# Patient Record
Sex: Male | Born: 1960 | Race: White | Hispanic: No | Marital: Single | Smoking: Former smoker
Health system: Southern US, Community
[De-identification: ages and names within clinical notes are randomized; demographics above are authoritative.]

## PROBLEM LIST (undated history)

## (undated) DIAGNOSIS — K219 Gastro-esophageal reflux disease without esophagitis: Secondary | ICD-10-CM

## (undated) DIAGNOSIS — E785 Hyperlipidemia, unspecified: Secondary | ICD-10-CM

## (undated) DIAGNOSIS — C801 Malignant (primary) neoplasm, unspecified: Secondary | ICD-10-CM

## (undated) DIAGNOSIS — G4700 Insomnia, unspecified: Secondary | ICD-10-CM

## (undated) DIAGNOSIS — I10 Essential (primary) hypertension: Secondary | ICD-10-CM

## (undated) DIAGNOSIS — G4730 Sleep apnea, unspecified: Secondary | ICD-10-CM

## (undated) DIAGNOSIS — Z7251 High risk heterosexual behavior: Secondary | ICD-10-CM

## (undated) DIAGNOSIS — H409 Unspecified glaucoma: Secondary | ICD-10-CM

## (undated) HISTORY — PX: RETINAL DETACHMENT SURGERY: SHX105

## (undated) HISTORY — PX: COLONOSCOPY: SHX174

## (undated) HISTORY — DX: High risk heterosexual behavior: Z72.51

## (undated) HISTORY — DX: Unspecified glaucoma: H40.9

## (undated) HISTORY — DX: Insomnia, unspecified: G47.00

## (undated) HISTORY — PX: TONSILLECTOMY: SUR1361

---

## 2012-05-25 HISTORY — PX: COLONOSCOPY: SHX174

## 2019-06-28 DIAGNOSIS — Z113 Encounter for screening for infections with a predominantly sexual mode of transmission: Secondary | ICD-10-CM | POA: Diagnosis not present

## 2019-06-28 DIAGNOSIS — Z114 Encounter for screening for human immunodeficiency virus [HIV]: Secondary | ICD-10-CM | POA: Diagnosis not present

## 2019-07-03 DIAGNOSIS — Z6841 Body Mass Index (BMI) 40.0 and over, adult: Secondary | ICD-10-CM | POA: Diagnosis not present

## 2019-07-03 DIAGNOSIS — G4730 Sleep apnea, unspecified: Secondary | ICD-10-CM | POA: Diagnosis not present

## 2019-07-03 DIAGNOSIS — E7800 Pure hypercholesterolemia, unspecified: Secondary | ICD-10-CM | POA: Diagnosis not present

## 2019-07-03 DIAGNOSIS — I10 Essential (primary) hypertension: Secondary | ICD-10-CM | POA: Diagnosis not present

## 2019-07-11 DIAGNOSIS — G4733 Obstructive sleep apnea (adult) (pediatric): Secondary | ICD-10-CM | POA: Diagnosis not present

## 2019-07-11 DIAGNOSIS — R5383 Other fatigue: Secondary | ICD-10-CM | POA: Diagnosis not present

## 2019-07-11 DIAGNOSIS — I10 Essential (primary) hypertension: Secondary | ICD-10-CM | POA: Diagnosis not present

## 2019-07-11 DIAGNOSIS — E669 Obesity, unspecified: Secondary | ICD-10-CM | POA: Diagnosis not present

## 2019-07-12 ENCOUNTER — Telehealth: Payer: Self-pay | Admitting: Pharmacy Technician

## 2019-07-12 ENCOUNTER — Other Ambulatory Visit: Payer: Self-pay

## 2019-07-12 ENCOUNTER — Ambulatory Visit (INDEPENDENT_AMBULATORY_CARE_PROVIDER_SITE_OTHER): Payer: BLUE CROSS/BLUE SHIELD | Admitting: Pharmacist

## 2019-07-12 ENCOUNTER — Other Ambulatory Visit (HOSPITAL_COMMUNITY)
Admission: RE | Admit: 2019-07-12 | Discharge: 2019-07-12 | Disposition: A | Discharge status: Home / Self Care | Payer: BLUE CROSS/BLUE SHIELD | Source: Ambulatory Visit | Attending: Infectious Disease | Admitting: Infectious Disease

## 2019-07-12 DIAGNOSIS — Z79899 Other long term (current) drug therapy: Secondary | ICD-10-CM | POA: Diagnosis not present

## 2019-07-12 DIAGNOSIS — Z113 Encounter for screening for infections with a predominantly sexual mode of transmission: Secondary | ICD-10-CM | POA: Diagnosis not present

## 2019-07-12 DIAGNOSIS — Z114 Encounter for screening for human immunodeficiency virus [HIV]: Secondary | ICD-10-CM | POA: Diagnosis not present

## 2019-07-12 NOTE — Telephone Encounter (Signed)
[facility] Patient Advocate Encounter   I was successful in obtaining a copay card for Descovy through [place].  This copay card will make the patient's copay $0.  I have spoken with the patient and they will have to fill with [facility].    The billing information is:   Member ID: [id number] RxBin: [id number] PCN: [id number] Group: [id number]  Patient knows to call the office with questions or concerns.  Bartholomew Crews, CPhT Specialty Pharmacy Patient [facility] for [facility] Phone: [phone] Fax: [phone] 07/12/2019 10:26 AM

## 2019-07-12 NOTE — Telephone Encounter (Signed)
[facility] Patient Advocate Encounter    Findings of the benefits investigation conducted this morning via test claims for the patient's upcoming appointment on 07/12/2019 are as follows:   Insurance: Advance- active commercial [facility] [phone]   [facility] Patient Advocate will follow up once patient arrives for their appointment to see if they need a copayment card.  Bartholomew Crews, CPhT Specialty Pharmacy Patient [facility] for Infectious Disease Phone: [phone] Fax: [phone] 07/12/2019 8:11 AM

## 2019-07-12 NOTE — Progress Notes (Signed)
Date:  07/12/2019   HPI: Craig Cole is a 59 y.o. male who presents to the [place] clinic to discuss and initiate PrEP.  Insured   [x]    Uninsured  []    There are no problems to display for this patient.   Patient's Medications  New Prescriptions   No medications on file  Previous Medications   ASPIRIN EC 81 MG TABLET    Take 81 mg by mouth daily.   DILTIAZEM (CARDIZEM CD) 240 MG 24 HR CAPSULE    Take 240 mg by mouth daily.   DORZOLAMIDE-TIMOLOL (COSOPT) 22.3-6.8 MG/ML OPHTHALMIC SOLUTION    Place 1 drop into the left eye 2 (two) times daily.   EMTRICITABINE-TENOFOVIR AF (DESCOVY) 200-25 MG TABLET    Take 1 tablet by mouth daily.   ESOMEPRAZOLE (NEXIUM) 40 MG CAPSULE    Take 40 mg by mouth every morning.   LISINOPRIL-HYDROCHLOROTHIAZIDE (ZESTORETIC) 20-25 MG TABLET    Take 1 tablet by mouth daily.   MONTELUKAST (SINGULAIR) 10 MG TABLET    Take 10 mg by mouth at bedtime.   SIMVASTATIN (ZOCOR) 40 MG TABLET    Take 40 mg by mouth at bedtime.   TADALAFIL 2.5 MG TABS    Take 2.5 mg by mouth daily.   TAMSULOSIN (FLOMAX) 0.4 MG CAPS CAPSULE    Take 0.4 mg by mouth at bedtime.  Modified Medications   No medications on file  Discontinued Medications   No medications on file    Allergies: Not on File  Past Medical History: No past medical history on file.  Social History: Social History   Socioeconomic History  . Marital status: Single    Spouse name: Not on file  . Number of children: Not on file  . Years of education: Not on file  . Highest education level: Not on file  Occupational History  . Not on file  Tobacco Use  . Smoking status: Not on file  Substance and Sexual Activity  . Alcohol use: Not on file  . Drug use: Not on file  . Sexual activity: Not on file  Other Topics Concern  . Not on file  Social History Narrative  . Not on file   Social Determinants of Health   Financial Resource Strain:   . Difficulty of Paying Living Expenses: Not on file    Food Insecurity:   . Worried About Charity fundraiser in the Last Year: Not on file  . Ran Out of Food in the Last Year: Not on file  Transportation Needs:   . Lack of Transportation (Medical): Not on file  . Lack of Transportation (Non-Medical): Not on file  Physical Activity:   . Days of Exercise per Week: Not on file  . Minutes of Exercise per Session: Not on file  Stress:   . Feeling of Stress : Not on file  Social Connections:   . Frequency of Communication with Friends and Family: Not on file  . Frequency of Social Gatherings with Friends and Family: Not on file  . Attends Religious Services: Not on file  . Active Member of Clubs or Organizations: Not on file  . Attends Archivist Meetings: Not on file  . Marital Status: Not on file    CHL HIV PREP FLOWSHEET RESULTS 07/12/2019  Insurance Status Insured  Gender at birth Male  Gender identity cis-Male  Risk for HIV Condomless vaginal or anal intercourse;Hx of STI  Sex Partners Men only  # sex  partners past 3-6 mos 4-6  Sex activity preferences Oral;Receptive  Condom use No  HIV symptoms? N/A    Labs:  SCr: No results found for: CREATININE HIV No results found for: HIV Hepatitis B No results found for: HEPBSAB, HEPBSAG, HEPBCAB Hepatitis C No results found for: HEPCAB, HCVRNAPCRQN Hepatitis A No results found for: HAV RPR and STI No results found for: LABRPR, RPRTITER  No flowsheet data found.  Assessment: Craig Cole presents to the clinic today to transfer care of his PrEP to [facility]. Patient has been on Descovy for many years and was being seen in [place] where he was living. Patient stated he had been on Truvada at one point and then switched to Descovy which his insurance did cover. [facility] so we will continue that today. A copay card was provided to the patient to ensure he has no problems accessing his medication.   Patient  reports having roughly 4 partners in the last 3-6 months and very rarely uses condoms. 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 However in June patient had a titer of 1:8 and was treated with one dose of Bicillin. His post treatment RPR titer was 1:8 and he received another dose of Bicillin. In September his RPR titer was 1:16 at which point he received 3 doses of Bicillin. Patient was recently screened at the health department and had a titer of 1:8 which may be his new baseline. Patient denies any symptoms of confusion, blurred vision, or other neurological side effects indicative of neurosyphilis. Will consult with other providers when his RPR titer results to determine next steps.   Craig Cole reported never having any troublesome side effects when taking either Truvada or Descovy but did note vivid dreams after switching to Descovy roughly a year ago. Patient denies problems with adherence and stated he just received a 90 day supply of Descovy in the mail. Will continue Craig Cole on his current regimen and send in a 90 day supply that can be put on hold. Will follow-up in 3 months.   Plan: - HIV antibody, HepA antibody, HepBsAg, HepBsAb, HepC antibody, RPR, urine cytology, BMET - Descovy x3 months if HIV negative - Follow up with  Cassie on 4/20 at [place], PharmD [facility] for Infectious Disease 07/12/2019, 11:15 AM

## 2019-07-13 LAB — BASIC METABOLIC PANEL
BUN: 13 mg/dL (ref 7–25)
CO2: 28 mmol/L (ref 20–32)
Calcium: 9.7 mg/dL (ref 8.6–10.3)
Chloride: 104 mmol/L (ref 98–110)
Creat: 0.73 mg/dL (ref 0.70–1.25)
Glucose, Bld: 99 mg/dL (ref 65–99)
Potassium: 3.8 mmol/L (ref 3.5–5.3)
Sodium: 141 mmol/L (ref 135–146)

## 2019-07-13 LAB — RPR: RPR Ser Ql: REACTIVE — AB

## 2019-07-13 LAB — URINE CYTOLOGY ANCILLARY ONLY
Chlamydia: NEGATIVE
Comment: NEGATIVE
Comment: NORMAL
Neisseria Gonorrhea: NEGATIVE

## 2019-07-13 LAB — FLUORESCENT TREPONEMAL AB(FTA)-IGG-BLD: Fluorescent Treponemal ABS: REACTIVE — AB

## 2019-07-13 LAB — HEPATITIS C ANTIBODY
Hepatitis C Ab: NONREACTIVE
SIGNAL TO CUT-OFF: 0.01 (ref <1.00)

## 2019-07-13 LAB — HEPATITIS B SURFACE ANTIGEN: Hepatitis B Surface Ag: NONREACTIVE

## 2019-07-13 LAB — HIV ANTIBODY (ROUTINE TESTING W REFLEX): HIV 1&2 Ab, 4th Generation: NONREACTIVE

## 2019-07-13 LAB — HEPATITIS B SURFACE ANTIBODY,QUALITATIVE: Hep B S Ab: NONREACTIVE

## 2019-07-13 LAB — RPR TITER: RPR Titer: 1:8 {titer} — ABNORMAL HIGH

## 2019-07-13 LAB — HEPATITIS A ANTIBODY, TOTAL: Hepatitis A AB,Total: REACTIVE — AB

## 2019-07-14 ENCOUNTER — Telehealth: Payer: Self-pay

## 2019-07-14 DIAGNOSIS — Z79899 Other long term (current) drug therapy: Secondary | ICD-10-CM

## 2019-07-14 MED ORDER — DESCOVY 200-25 MG PO TABS: 1.0000 | ORAL_TABLET | Freq: Every day | ORAL | 0 refills | Status: DC

## 2019-07-14 NOTE — Telephone Encounter (Signed)
Agree with Emma's assessement.

## 2019-07-14 NOTE — Telephone Encounter (Signed)
Spoke with Mr. Craig Cole over the phone regarding the results of his last visit. Patient is HIV negative and has an RPR titer of 1:8, which seems to be the patient's new baseline. Will not treat for syphillis at this time. Patient was negative for HepB surface antigen and HepB surface antibody but was positive for HepA antibody. Mr. Craig Cole had questions about these results and I explained that this means he is immune to HepA and does not need to be revaccinated but that his immunity has waned for HepB and he will need to be revaccinated. Mr. Craig Cole was interested in being revaccinated and requested to start this at his next visit.  Patient stated he has plenty of Descovy on hand but we will [facility] to ensure patient does not run out.

## 2019-07-31 DIAGNOSIS — J309 Allergic rhinitis, unspecified: Secondary | ICD-10-CM | POA: Diagnosis not present

## 2019-07-31 DIAGNOSIS — I10 Essential (primary) hypertension: Secondary | ICD-10-CM | POA: Diagnosis not present

## 2019-07-31 DIAGNOSIS — K219 Gastro-esophageal reflux disease without esophagitis: Secondary | ICD-10-CM | POA: Diagnosis not present

## 2019-07-31 DIAGNOSIS — E7849 Other hyperlipidemia: Secondary | ICD-10-CM | POA: Diagnosis not present

## 2019-07-31 DIAGNOSIS — I119 Hypertensive heart disease without heart failure: Secondary | ICD-10-CM | POA: Diagnosis not present

## 2019-07-31 DIAGNOSIS — R5383 Other fatigue: Secondary | ICD-10-CM | POA: Diagnosis not present

## 2019-07-31 DIAGNOSIS — R972 Elevated prostate specific antigen [PSA]: Secondary | ICD-10-CM | POA: Diagnosis not present

## 2019-07-31 DIAGNOSIS — E559 Vitamin D deficiency, unspecified: Secondary | ICD-10-CM | POA: Diagnosis not present

## 2019-08-14 DIAGNOSIS — E7849 Other hyperlipidemia: Secondary | ICD-10-CM | POA: Diagnosis not present

## 2019-08-14 DIAGNOSIS — K219 Gastro-esophageal reflux disease without esophagitis: Secondary | ICD-10-CM | POA: Diagnosis not present

## 2019-08-14 DIAGNOSIS — N529 Male erectile dysfunction, unspecified: Secondary | ICD-10-CM | POA: Diagnosis not present

## 2019-08-14 DIAGNOSIS — N400 Enlarged prostate without lower urinary tract symptoms: Secondary | ICD-10-CM | POA: Diagnosis not present

## 2019-08-21 ENCOUNTER — Encounter: Payer: Self-pay | Admitting: Pharmacist

## 2019-09-08 ENCOUNTER — Ambulatory Visit: Payer: BC Managed Care – PPO | Attending: Internal Medicine

## 2019-09-08 DIAGNOSIS — Z23 Encounter for immunization: Secondary | ICD-10-CM

## 2019-09-08 NOTE — Progress Notes (Signed)
   Covid-19 Vaccination Clinic  Name:  Craig Cole    MRN: [id number] DOB: December 13, 1960  09/08/2019  [name] was observed post Covid-19 immunization for 15 minutes without incident. He was provided with Vaccine Information Sheet and instruction to access the V-Safe system.   [name] was instructed to call 911 with any severe reactions post vaccine: Marland Kitchen Difficulty breathing  . Swelling of face and throat  . A fast heartbeat  . A bad rash all over body  . Dizziness and weakness   Immunizations Administered    Name Date Dose VIS Date Route   Moderna COVID-19 Vaccine 09/08/2019 10:47 AM 0.5 mL 05/23/2019 Intramuscular   Manufacturer: Moderna   Lot: [id number]   [place][id number]

## 2019-10-10 ENCOUNTER — Ambulatory Visit: Payer: BC Managed Care – PPO | Attending: Internal Medicine

## 2019-10-10 ENCOUNTER — Ambulatory Visit: Payer: BLUE CROSS/BLUE SHIELD | Admitting: Pharmacist

## 2019-10-10 DIAGNOSIS — Z23 Encounter for immunization: Secondary | ICD-10-CM

## 2019-10-10 NOTE — Progress Notes (Signed)
   Covid-19 Vaccination Clinic  Name:  Craig Cole    MRN: [id number] DOB: 1960/09/04  10/10/2019  [name] was observed post Covid-19 immunization for 15 minutes without incident. He was provided with Vaccine Information Sheet and instruction to access the V-Safe system.   [name] was instructed to call 911 with any severe reactions post vaccine: Marland Kitchen Difficulty breathing  . Swelling of face and throat  . A fast heartbeat  . A bad rash all over body  . Dizziness and weakness   Immunizations Administered    Name Date Dose VIS Date Route   Moderna COVID-19 Vaccine 10/10/2019 10:29 AM 0.5 mL 05/2019 Intramuscular   Manufacturer: Moderna   Lot: [id number]   [place]:[id number]

## 2019-10-17 ENCOUNTER — Ambulatory Visit (INDEPENDENT_AMBULATORY_CARE_PROVIDER_SITE_OTHER): Payer: BC Managed Care – PPO | Admitting: Pharmacist

## 2019-10-17 ENCOUNTER — Other Ambulatory Visit: Payer: Self-pay

## 2019-10-17 DIAGNOSIS — Z79899 Other long term (current) drug therapy: Secondary | ICD-10-CM | POA: Diagnosis not present

## 2019-10-17 NOTE — Progress Notes (Signed)
Date:  10/17/2019   HPI: Craig Cole is a 59 y.o. male who presents to the [place] clinic for 3 month PrEP follow-up.  Insured   [x]    Uninsured  []    There are no problems to display for this patient.   Patient's Medications  New Prescriptions   No medications on file  Previous Medications   ASPIRIN EC 81 MG TABLET    Take 81 mg by mouth daily.   DILTIAZEM (CARDIZEM CD) 240 MG 24 HR CAPSULE    Take 240 mg by mouth daily.   DORZOLAMIDE-TIMOLOL (COSOPT) 22.3-6.8 MG/ML OPHTHALMIC SOLUTION    Place 1 drop into the left eye 2 (two) times daily.   EMTRICITABINE-TENOFOVIR AF (DESCOVY) 200-25 MG TABLET    Take 1 tablet by mouth daily.   ESOMEPRAZOLE (NEXIUM) 40 MG CAPSULE    Take 40 mg by mouth every morning.   LISINOPRIL-HYDROCHLOROTHIAZIDE (ZESTORETIC) 20-25 MG TABLET    Take 1 tablet by mouth daily.   MONTELUKAST (SINGULAIR) 10 MG TABLET    Take 10 mg by mouth at bedtime.   SIMVASTATIN (ZOCOR) 40 MG TABLET    Take 40 mg by mouth at bedtime.   TADALAFIL 2.5 MG TABS    Take 2.5 mg by mouth daily.   TAMSULOSIN (FLOMAX) 0.4 MG CAPS CAPSULE    Take 0.4 mg by mouth at bedtime.  Modified Medications   No medications on file  Discontinued Medications   No medications on file    Allergies: Not on File  Past Medical History: No past medical history on file.  Social History: Social History   Socioeconomic History  . Marital status: Single    Spouse name: Not on file  . Number of children: Not on file  . Years of education: Not on file  . Highest education level: Not on file  Occupational History  . Not on file  Tobacco Use  . Smoking status: Not on file  Substance and Sexual Activity  . Alcohol use: Not on file  . Drug use: Not on file  . Sexual activity: Not on file  Other Topics Concern  . Not on file  Social History Narrative  . Not on file   Social Determinants of Health   Financial Resource Strain:   . Difficulty of Paying Living Expenses:   Food  Insecurity:   . Worried About Charity fundraiser in the Last Year:   . Arboriculturist in the Last Year:   Transportation Needs:   . Film/video editor (Medical):   Marland Kitchen Lack of Transportation (Non-Medical):   Physical Activity:   . Days of Exercise per Week:   . Minutes of Exercise per Session:   Stress:   . Feeling of Stress :   Social Connections:   . Frequency of Communication with Friends and Family:   . Frequency of Social Gatherings with Friends and Family:   . Attends Religious Services:   . Active Member of Clubs or Organizations:   . Attends Archivist Meetings:   Marland Kitchen Marital Status:     CHL HIV PREP FLOWSHEET RESULTS 07/12/2019  Insurance Status Insured  Gender at birth Male  Gender identity cis-Male  Risk for HIV Condomless vaginal or anal intercourse;Hx of STI  Sex Partners Men only  # sex partners past 3-6 mos 4-6  Sex activity preferences Oral;Receptive  Condom use No  HIV symptoms? N/A    Labs:  SCr: Lab Results  Component Value  Date   CREATININE 0.73 07/12/2019   HIV Lab Results  Component Value Date   HIV NON-REACTIVE 07/12/2019   Hepatitis B Lab Results  Component Value Date   HEPBSAB NON-REACTIVE 07/12/2019   HEPBSAG NON-REACTIVE 07/12/2019   Hepatitis C Lab Results  Component Value Date   HEPCAB NON-REACTIVE 07/12/2019   Hepatitis A Lab Results  Component Value Date   HAV REACTIVE (A) 07/12/2019   RPR and STI Lab Results  Component Value Date   LABRPR REACTIVE (A) 07/12/2019   RPRTITER 1:8 (H) 07/12/2019    STI Results GC CT  07/12/2019 Negative Negative    Assessment: Craig Cole is here today for a follow up PrEP appointment. He is currently taking Descovy with no issues and expresses perfect adherence. He states that he still has plenty of Descovy at this time. He has had no new partners or concerns for STDs today. Craig Cole was also interested in receiving the hepatitis B vaccine today, but was unable to get it due to  receiving his second COVID-19 vaccine (Moderna) one week ago. He recently got a new job in [place] which could limit his ability to travel to the clinic. We advised him to let us know if he needs to reschedule any appointments.   He has been rescheduled to start the hepatitis B vaccine series on May 12th with Cassie.Today, we will check his HIV antibody and Bmet. If HIV negative, Cassie will send in 3 more months of Descovy to his pharmacy. After his vaccine, he will follow up at the clinic on July 26th at 10 am.   Plan: - Order HIV antibody, Bmet - If HIV negative, will send in 3 more months of Craig Cole - Patient will return on 5/12 to receive Hepatitis B vaccine - Patient will return on 7/26 for follow-up PrEP appointment  Agnes Lawrence, PharmD PGY1 Pharmacy Resident [facility] for Infectious Disease 10/17/2019, 12:21 PM

## 2019-10-18 ENCOUNTER — Other Ambulatory Visit: Payer: Self-pay | Admitting: Pharmacist

## 2019-10-18 DIAGNOSIS — E669 Obesity, unspecified: Secondary | ICD-10-CM | POA: Diagnosis not present

## 2019-10-18 DIAGNOSIS — I10 Essential (primary) hypertension: Secondary | ICD-10-CM | POA: Diagnosis not present

## 2019-10-18 DIAGNOSIS — R5383 Other fatigue: Secondary | ICD-10-CM | POA: Diagnosis not present

## 2019-10-18 DIAGNOSIS — G4733 Obstructive sleep apnea (adult) (pediatric): Secondary | ICD-10-CM | POA: Diagnosis not present

## 2019-10-18 DIAGNOSIS — Z79899 Other long term (current) drug therapy: Secondary | ICD-10-CM

## 2019-10-18 LAB — BASIC METABOLIC PANEL
BUN: 15 mg/dL (ref 7–25)
CO2: 28 mmol/L (ref 20–32)
Calcium: 9.5 mg/dL (ref 8.6–10.3)
Chloride: 104 mmol/L (ref 98–110)
Creat: 0.77 mg/dL (ref 0.70–1.25)
Glucose, Bld: 111 mg/dL — ABNORMAL HIGH (ref 65–99)
Potassium: 4 mmol/L (ref 3.5–5.3)
Sodium: 139 mmol/L (ref 135–146)

## 2019-10-18 LAB — HIV ANTIBODY (ROUTINE TESTING W REFLEX): HIV 1&2 Ab, 4th Generation: NONREACTIVE

## 2019-10-18 MED ORDER — DESCOVY 200-25 MG PO TABS: 1.0000 | ORAL_TABLET | Freq: Every day | ORAL | 0 refills | Status: DC

## 2019-10-18 NOTE — Progress Notes (Signed)
Patient's HIV antibody is negative.  Will send in 3 more months of [facility] to [facility].

## 2019-11-01 ENCOUNTER — Ambulatory Visit (INDEPENDENT_AMBULATORY_CARE_PROVIDER_SITE_OTHER): Payer: BC Managed Care – PPO | Admitting: Pharmacist

## 2019-11-01 ENCOUNTER — Other Ambulatory Visit: Payer: Self-pay

## 2019-11-01 DIAGNOSIS — Z23 Encounter for immunization: Secondary | ICD-10-CM

## 2019-11-01 DIAGNOSIS — Z961 Presence of intraocular lens: Secondary | ICD-10-CM | POA: Diagnosis not present

## 2019-11-01 DIAGNOSIS — H2511 Age-related nuclear cataract, right eye: Secondary | ICD-10-CM | POA: Diagnosis not present

## 2019-11-01 DIAGNOSIS — H52203 Unspecified astigmatism, bilateral: Secondary | ICD-10-CM | POA: Diagnosis not present

## 2019-11-01 DIAGNOSIS — H524 Presbyopia: Secondary | ICD-10-CM | POA: Diagnosis not present

## 2019-11-01 DIAGNOSIS — H401131 Primary open-angle glaucoma, bilateral, mild stage: Secondary | ICD-10-CM | POA: Diagnosis not present

## 2019-11-01 NOTE — Progress Notes (Signed)
   [facility] for Infectious Disease Pharmacy Vaccination Visit  HPI: Craig Cole is a 59 y.o. male who presents to the [place] clinic for vaccine administration.  Hepatitis B Lab Results  Component Value Date   HEPBSAB NON-REACTIVE 07/12/2019   Lab Results  Component Value Date   HEPBSAG NON-REACTIVE 07/12/2019    Hepatitis C No results found for: HCVAB  Hepatitis A Lab Results  Component Value Date   HAV REACTIVE (A) 07/12/2019    Assessment: Craig Cole presents today for his first dose of HepB vaccination. Patient reports no significant reactions to any previous vaccines and has completed his Moderna series on October 10, 2019. Patient brought in [place] (Zioptan) 0.0015% eye drops today and would like [place] to add it to his medication profile. Of note, patient reports his last TDAP vaccine was January 18, 2018 per patient's MyChart record from Dr. Virgel Gess Cole's primary care office in [place].   Plan: - Administer 1st dose of Heplisav-B vaccine - Return in 1 month for 2nd shot on June 14 at 2:30PM - Add Tafluprost to medication list   Craig Cole, PharmD PGY1 [facility] for Infectious Disease 11/01/2019, 12:00 PM

## 2019-12-04 ENCOUNTER — Ambulatory Visit: Payer: BC Managed Care – PPO | Admitting: Pharmacist

## 2019-12-12 ENCOUNTER — Ambulatory Visit (INDEPENDENT_AMBULATORY_CARE_PROVIDER_SITE_OTHER): Payer: BC Managed Care – PPO | Admitting: Pharmacist

## 2019-12-12 ENCOUNTER — Other Ambulatory Visit: Payer: Self-pay

## 2019-12-12 DIAGNOSIS — E559 Vitamin D deficiency, unspecified: Secondary | ICD-10-CM | POA: Diagnosis not present

## 2019-12-12 DIAGNOSIS — Z23 Encounter for immunization: Secondary | ICD-10-CM | POA: Diagnosis not present

## 2019-12-12 DIAGNOSIS — I110 Hypertensive heart disease with heart failure: Secondary | ICD-10-CM | POA: Diagnosis not present

## 2019-12-12 DIAGNOSIS — J309 Allergic rhinitis, unspecified: Secondary | ICD-10-CM | POA: Diagnosis not present

## 2019-12-12 DIAGNOSIS — K219 Gastro-esophageal reflux disease without esophagitis: Secondary | ICD-10-CM | POA: Diagnosis not present

## 2019-12-12 NOTE — Progress Notes (Signed)
Date:  12/12/2019   HPI: Craig Cole is a 59 y.o. male who presents to the [facility] [place] clinic for his second dose of the hepatitis B vaccine.  Insured   [x]    Uninsured  []    There are no problems to display for this patient.   Patient's Medications  New Prescriptions   No medications on file  Previous Medications   ASPIRIN EC 81 MG TABLET    Take 81 mg by mouth daily.   DILTIAZEM (CARDIZEM CD) 240 MG 24 HR CAPSULE    Take 240 mg by mouth daily.   DORZOLAMIDE-TIMOLOL (COSOPT) 22.3-6.8 MG/ML OPHTHALMIC SOLUTION    Place 1 drop into the left eye 2 (two) times daily.   EMTRICITABINE-TENOFOVIR AF (DESCOVY) 200-25 MG TABLET    Take 1 tablet by mouth daily.   ESOMEPRAZOLE (NEXIUM) 40 MG CAPSULE    Take 40 mg by mouth every morning.   LISINOPRIL-HYDROCHLOROTHIAZIDE (ZESTORETIC) 20-25 MG TABLET    Take 1 tablet by mouth daily.   MONTELUKAST (SINGULAIR) 10 MG TABLET    Take 10 mg by mouth at bedtime.   SIMVASTATIN (ZOCOR) 40 MG TABLET    Take 40 mg by mouth at bedtime.   TADALAFIL 2.5 MG TABS    Take 2.5 mg by mouth daily.   TAFLUPROST, PF, 0.0015 % SOLN    Place 0.0015 % into both eyes at bedtime.   TAMSULOSIN (FLOMAX) 0.4 MG CAPS CAPSULE    Take 0.4 mg by mouth at bedtime.  Modified Medications   No medications on file  Discontinued Medications   No medications on file    Allergies: Not on File  Past Medical History: No past medical history on file.  Social History: Social History   Socioeconomic History  . Marital status: Single    Spouse name: Not on file  . Number of children: Not on file  . Years of education: Not on file  . Highest education level: Not on file  Occupational History  . Not on file  Tobacco Use  . Smoking status: Not on file  Substance and Sexual Activity  . Alcohol use: Not on file  . Drug use: Not on file  . Sexual activity: Not on file  Other Topics Concern  . Not on file  Social History Narrative  . Not on file   Social Determinants of  Health   Financial Resource Strain:   . Difficulty of Paying Living Expenses:   Food Insecurity:   . Worried About Charity fundraiser in the Last Year:   . Arboriculturist in the Last Year:   Transportation Needs:   . Film/video editor (Medical):   Marland Kitchen Lack of Transportation (Non-Medical):   Physical Activity:   . Days of Exercise per Week:   . Minutes of Exercise per Session:   Stress:   . Feeling of Stress :   Social Connections:   . Frequency of Communication with Friends and Family:   . Frequency of Social Gatherings with Friends and Family:   . Attends Religious Services:   . Active Member of Clubs or Organizations:   . Attends Archivist Meetings:   Marland Kitchen Marital Status:     CHL HIV PREP FLOWSHEET RESULTS 07/12/2019  Insurance Status Insured  Gender at birth Male  Gender identity cis-Male  Risk for HIV Condomless vaginal or anal intercourse;Hx of STI  Sex Partners Men only  # sex partners past 3-6 mos 4-6  Sex  activity preferences Oral;Receptive  Condom use No  HIV symptoms? N/A    Labs:  SCr: Lab Results  Component Value Date   CREATININE 0.77 10/17/2019   CREATININE 0.73 07/12/2019   HIV Lab Results  Component Value Date   HIV NON-REACTIVE 10/17/2019   HIV NON-REACTIVE 07/12/2019   Hepatitis B Lab Results  Component Value Date   HEPBSAB NON-REACTIVE 07/12/2019   HEPBSAG NON-REACTIVE 07/12/2019   Hepatitis C Lab Results  Component Value Date   HEPCAB NON-REACTIVE 07/12/2019   Hepatitis A Lab Results  Component Value Date   HAV REACTIVE (A) 07/12/2019   RPR and STI Lab Results  Component Value Date   LABRPR REACTIVE (A) 07/12/2019   RPRTITER 1:8 (H) 07/12/2019    STI Results GC CT  07/12/2019 Negative Negative    Assessment: Craig Cole is here in clinic to receive his second dose of the hepatitis B vaccine. He says he had no issues with the first dose so I suspect he will do just fine with this one as well.   He wanted to discuss  moving his next visit with [place] that was scheduled for 7/26 out a little further so he wasn't having doctors appointments every month. He says he has over 3 months of Descovy so it should be no problem to move his appointment out to September when he has his annual primary care visit. He reports having no issues on Descovy, and no sexual partners since the last time we saw him. He will call us if he has any issues with his medication.   Plan: - Complete hepatitis B vaccine series - F/u 03/11/20 for PrEP  Nicoletta Dress, PharmD PGY2 Infectious Disease Pharmacy Resident  [facility] for Infectious Disease 12/12/2019, 2:12 PM

## 2019-12-14 ENCOUNTER — Other Ambulatory Visit: Payer: Self-pay | Admitting: Pharmacist

## 2019-12-14 DIAGNOSIS — Z79899 Other long term (current) drug therapy: Secondary | ICD-10-CM

## 2020-01-01 ENCOUNTER — Other Ambulatory Visit: Payer: Self-pay | Admitting: Pharmacist

## 2020-01-01 DIAGNOSIS — Z79899 Other long term (current) drug therapy: Secondary | ICD-10-CM

## 2020-01-01 MED ORDER — DESCOVY 200-25 MG PO TABS: 1.0000 | ORAL_TABLET | Freq: Every day | ORAL | 0 refills | Status: DC

## 2020-01-09 DIAGNOSIS — E669 Obesity, unspecified: Secondary | ICD-10-CM | POA: Diagnosis not present

## 2020-01-09 DIAGNOSIS — G4733 Obstructive sleep apnea (adult) (pediatric): Secondary | ICD-10-CM | POA: Diagnosis not present

## 2020-01-09 DIAGNOSIS — R5383 Other fatigue: Secondary | ICD-10-CM | POA: Diagnosis not present

## 2020-01-09 DIAGNOSIS — I10 Essential (primary) hypertension: Secondary | ICD-10-CM | POA: Diagnosis not present

## 2020-01-15 ENCOUNTER — Ambulatory Visit: Payer: BC Managed Care – PPO | Admitting: Pharmacist

## 2020-03-11 ENCOUNTER — Other Ambulatory Visit (HOSPITAL_COMMUNITY)
Admission: RE | Admit: 2020-03-11 | Discharge: 2020-03-11 | Disposition: A | Discharge status: Home / Self Care | Payer: BC Managed Care – PPO | Source: Ambulatory Visit | Attending: Infectious Disease | Admitting: Infectious Disease

## 2020-03-11 ENCOUNTER — Ambulatory Visit (INDEPENDENT_AMBULATORY_CARE_PROVIDER_SITE_OTHER): Payer: BC Managed Care – PPO | Admitting: Pharmacist

## 2020-03-11 ENCOUNTER — Other Ambulatory Visit: Payer: Self-pay

## 2020-03-11 DIAGNOSIS — Z79899 Other long term (current) drug therapy: Secondary | ICD-10-CM

## 2020-03-11 DIAGNOSIS — H401131 Primary open-angle glaucoma, bilateral, mild stage: Secondary | ICD-10-CM | POA: Diagnosis not present

## 2020-03-11 DIAGNOSIS — Z113 Encounter for screening for infections with a predominantly sexual mode of transmission: Secondary | ICD-10-CM | POA: Diagnosis not present

## 2020-03-11 DIAGNOSIS — Z114 Encounter for screening for human immunodeficiency virus [HIV]: Secondary | ICD-10-CM | POA: Diagnosis not present

## 2020-03-11 NOTE — Progress Notes (Signed)
Date:  03/11/2020   HPI: Craig Cole is a 59 y.o. male who presents to the [facility] clinic for HIV PrEP follow-up.  Insured   []    Uninsured  []    There are no problems to display for this patient.   Patient's Medications  New Prescriptions   No medications on file  Previous Medications   ASPIRIN EC 81 MG TABLET    Take 81 mg by mouth daily.   DILTIAZEM (CARDIZEM CD) 240 MG 24 HR CAPSULE    Take 240 mg by mouth daily.   DORZOLAMIDE-TIMOLOL (COSOPT) 22.3-6.8 MG/ML OPHTHALMIC SOLUTION    Place 1 drop into the left eye 2 (two) times daily.   EMTRICITABINE-TENOFOVIR AF (DESCOVY) 200-25 MG TABLET    Take 1 tablet by mouth daily.   ESOMEPRAZOLE (NEXIUM) 40 MG CAPSULE    Take 40 mg by mouth every morning.   LISINOPRIL-HYDROCHLOROTHIAZIDE (ZESTORETIC) 20-25 MG TABLET    Take 1 tablet by mouth daily.   MONTELUKAST (SINGULAIR) 10 MG TABLET    Take 10 mg by mouth at bedtime.   SIMVASTATIN (ZOCOR) 40 MG TABLET    Take 40 mg by mouth at bedtime.   TADALAFIL 2.5 MG TABS    Take 2.5 mg by mouth daily.   TAFLUPROST, PF, 0.0015 % SOLN    Place 0.0015 % into both eyes at bedtime.   TAMSULOSIN (FLOMAX) 0.4 MG CAPS CAPSULE    Take 0.4 mg by mouth at bedtime.  Modified Medications   No medications on file  Discontinued Medications   No medications on file    Allergies: Not on File  Past Medical History: No past medical history on file.  Social History: Social History   Socioeconomic History   Marital status: Single    Spouse name: Not on file   Number of children: Not on file   Years of education: Not on file   Highest education level: Not on file  Occupational History   Not on file  Tobacco Use   Smoking status: Not on file  Substance and Sexual Activity   Alcohol use: Not on file   Drug use: Not on file   Sexual activity: Not on file  Other Topics Concern   Not on file  Social History Narrative   Not on file   Social Determinants of Health   Financial  Resource Strain:    Difficulty of Paying Living Expenses: Not on file  Food Insecurity:    Worried About [place] in the Last Year: Not on file   Ran Out of Food in the Last Year: Not on file  Transportation Needs:    Lack of Transportation (Medical): Not on file   Lack of Transportation (Non-Medical): Not on file  Physical Activity:    Days of Exercise per Week: Not on file   Minutes of Exercise per Session: Not on file  Stress:    Feeling of Stress : Not on file  Social Connections:    Frequency of Communication with Friends and Family: Not on file   Frequency of Social Gatherings with Friends and Family: Not on file   Attends Religious Services: Not on file   Active Member of Clubs or Organizations: Not on file   Attends Archivist Meetings: Not on file   Marital Status: Not on file    CHL HIV PREP FLOWSHEET RESULTS 07/12/2019  Insurance Status Insured  Gender at birth Male  Gender identity cis-Male  Risk for  HIV Condomless vaginal or anal intercourse;Hx of STI  Sex Partners Men only  # sex partners past 3-6 mos 4-6  Sex activity preferences Oral;Receptive  Condom use No  HIV symptoms? N/A    Labs:  SCr: Lab Results  Component Value Date   CREATININE 0.77 10/17/2019   CREATININE 0.73 07/12/2019   HIV Lab Results  Component Value Date   HIV NON-REACTIVE 10/17/2019   HIV NON-REACTIVE 07/12/2019   Hepatitis B Lab Results  Component Value Date   HEPBSAB NON-REACTIVE 07/12/2019   HEPBSAG NON-REACTIVE 07/12/2019   Hepatitis C Lab Results  Component Value Date   HEPCAB NON-REACTIVE 07/12/2019   Hepatitis A Lab Results  Component Value Date   HAV REACTIVE (A) 07/12/2019   RPR and STI Lab Results  Component Value Date   LABRPR REACTIVE (A) 07/12/2019   RPRTITER 1:8 (H) 07/12/2019    STI Results GC CT  07/12/2019 Negative Negative    Assessment: Craig Cole is here for PrEP follow up. He has no issues tolerating or taking  his Descovy. He had a few months of Truvada on hand and was switched to Descovy, so he asked to push out his appointment a few months. No new partners or issues today. He gets his medication from [facility] and has no problems. Will check labs today and see him back in 3 months. He will need a 6 month follow up after that appointment and he is ok with that plan.  Plan: - HIV antibody, RPR, urine cytology today - Descovy x 3 months if HIV negative - F/u in 3 months  Tanylah Schnoebelen L. Shatonya Passon, PharmD, BCIDP, AAHIVP, CPP Clinical Pharmacist Practitioner Infectious Diseases [facility] Infect[facility] 03/11/2020, 11:43 AM

## 2020-03-12 ENCOUNTER — Other Ambulatory Visit: Payer: Self-pay | Admitting: Pharmacist

## 2020-03-12 ENCOUNTER — Encounter: Payer: Self-pay | Admitting: Pharmacist

## 2020-03-12 DIAGNOSIS — Z79899 Other long term (current) drug therapy: Secondary | ICD-10-CM

## 2020-03-12 LAB — URINE CYTOLOGY ANCILLARY ONLY
Chlamydia: NEGATIVE
Comment: NEGATIVE
Comment: NORMAL
Neisseria Gonorrhea: NEGATIVE

## 2020-03-12 LAB — FLUORESCENT TREPONEMAL AB(FTA)-IGG-BLD: Fluorescent Treponemal ABS: REACTIVE — AB

## 2020-03-12 LAB — RPR: RPR Ser Ql: REACTIVE — AB

## 2020-03-12 LAB — HIV ANTIBODY (ROUTINE TESTING W REFLEX): HIV 1&2 Ab, 4th Generation: NONREACTIVE

## 2020-03-12 LAB — RPR TITER: RPR Titer: 1:4 {titer} — ABNORMAL HIGH

## 2020-03-12 MED ORDER — DESCOVY 200-25 MG PO TABS: 1.0000 | ORAL_TABLET | Freq: Every day | ORAL | 0 refills | Status: DC

## 2020-04-02 DIAGNOSIS — E669 Obesity, unspecified: Secondary | ICD-10-CM | POA: Diagnosis not present

## 2020-04-02 DIAGNOSIS — R5383 Other fatigue: Secondary | ICD-10-CM | POA: Diagnosis not present

## 2020-04-02 DIAGNOSIS — G4733 Obstructive sleep apnea (adult) (pediatric): Secondary | ICD-10-CM | POA: Diagnosis not present

## 2020-04-11 ENCOUNTER — Other Ambulatory Visit (HOSPITAL_BASED_OUTPATIENT_CLINIC_OR_DEPARTMENT_OTHER): Payer: Self-pay

## 2020-04-11 DIAGNOSIS — G4733 Obstructive sleep apnea (adult) (pediatric): Secondary | ICD-10-CM

## 2020-05-03 ENCOUNTER — Other Ambulatory Visit: Payer: Self-pay

## 2020-05-03 ENCOUNTER — Ambulatory Visit (HOSPITAL_BASED_OUTPATIENT_CLINIC_OR_DEPARTMENT_OTHER): Payer: BC Managed Care – PPO | Attending: Neurology | Admitting: Neurology

## 2020-05-03 DIAGNOSIS — G4733 Obstructive sleep apnea (adult) (pediatric): Secondary | ICD-10-CM | POA: Insufficient documentation

## 2020-05-03 DIAGNOSIS — G4761 Periodic limb movement disorder: Secondary | ICD-10-CM | POA: Insufficient documentation

## 2020-05-12 NOTE — Procedures (Signed)
[place] A. Merlene Laughter, MD     [email or web address]             NOCTURNAL POLYSOMNOGRAPHY   LOCATION: [facility]  Patient Name: Craig Cole, Likins Date: 05/03/2020 Gender: Male D.O.B: 10/24/1960 Age (years): 69 Referring Provider: Phillips Odor MD, ABSM Height (inches): 77 Interpreting Physician: Phillips Odor MD, ABSM Weight (lbs): 351 RPSGT: Earney Hamburg BMI: 42 MRN: [id number] Neck Size: 18.50 CLINICAL INFORMATION Sleep Study Type: NPSG     Indication for sleep study: N/A     Epworth Sleepiness Score: 3     SLEEP STUDY TECHNIQUE As per the AASM Manual for the Scoring of Sleep and Associated Events v2.3 (April 2016) with a hypopnea requiring 4% desaturations.  The channels recorded and monitored were frontal, central and occipital EEG, electrooculogram (EOG), submentalis EMG (chin), nasal and oral airflow, thoracic and abdominal wall motion, anterior tibialis EMG, snore microphone, electrocardiogram, and pulse oximetry.  MEDICATIONS Medications self-administered by patient taken the night of the study : N/A  Current Outpatient Medications:  .  aspirin EC 81 MG tablet, Take 81 mg by mouth daily., Disp: , Rfl:  .  diltiazem (CARDIZEM CD) 240 MG 24 hr capsule, Take 240 mg by mouth daily., Disp: , Rfl:  .  dorzolamide-timolol (COSOPT) 22.3-6.8 MG/ML ophthalmic solution, Place 1 drop into the left eye 2 (two) times daily., Disp: , Rfl:  .  emtricitabine-tenofovir AF (DESCOVY) 200-25 MG tablet, Take 1 tablet by mouth daily., Disp: 90 tablet, Rfl: 0 .  esomeprazole (NEXIUM) 40 MG capsule, Take 40 mg by mouth every morning., Disp: , Rfl:  .  lisinopril (ZESTRIL) 20 MG tablet, Take 20 mg by mouth daily., Disp: , Rfl:  .  montelukast (SINGULAIR) 10 MG tablet, Take 10 mg by mouth at bedtime., Disp: , Rfl:  .  simvastatin (ZOCOR) 40 MG tablet, Take 40 mg by mouth at bedtime., Disp: , Rfl:  .  Tadalafil 2.5 MG TABS, Take 2.5 mg by mouth  daily., Disp: , Rfl:  .  Tafluprost, PF, 0.0015 % SOLN, Place 0.0015 % into both eyes at bedtime., Disp: , Rfl:  .  tamsulosin (FLOMAX) 0.4 MG CAPS capsule, Take 0.4 mg by mouth at bedtime., Disp: , Rfl:      SLEEP ARCHITECTURE The study was initiated at 10:55:24 PM and ended at 4:57:32 AM.  Sleep onset time was 13.4 minutes and the sleep efficiency was 74.9%%. The total sleep time was 271.2 minutes.  Stage REM latency was 222.0 minutes.  The patient spent 1.7%% of the night in stage N1 sleep, 83.8%% in stage N2 sleep, 0.0%% in stage N3 and 14.6% in REM.  Alpha intrusion was absent.  Supine sleep was 61.54%.  RESPIRATORY PARAMETERS The overall apnea/hypopnea index (AHI) was 22.1 per hour. There were 33 total apneas, including 33 obstructive, 0 central and 0 mixed apneas. There were 67 hypopneas and 78 RERAs.  The AHI during Stage REM sleep was 56.2 per hour.  AHI while supine was 32.7 per hour.  The mean oxygen saturation was 93.4%. The minimum SpO2 during sleep was 84.0%.  soft snoring was noted during this study.  CARDIAC DATA The 2 lead EKG demonstrated sinus rhythm. The mean heart rate was 59.1 beats per minute. Other EKG findings include: None.  LEG MOVEMENT DATA Mild periodic limb movements are noted.  IMPRESSIONS 1. Moderate obstructive sleep apnea is documented with this recording.  AutoPap 10-20 is recommended. 2. Mild periodic limb movements are also noted.  Delano Metz, MD Diplomate, American Board of Sleep Medicine.  ELECTRONICALLY SIGNED ON:  05/12/2020, 4:09 PM [place] PH: [phone]   FX: [phone] [place]

## 2020-06-04 ENCOUNTER — Encounter: Payer: Self-pay | Admitting: Pharmacist

## 2020-06-10 ENCOUNTER — Ambulatory Visit (INDEPENDENT_AMBULATORY_CARE_PROVIDER_SITE_OTHER): Payer: BC Managed Care – PPO | Admitting: Pharmacist

## 2020-06-10 ENCOUNTER — Other Ambulatory Visit: Payer: Self-pay | Admitting: Pharmacist

## 2020-06-10 ENCOUNTER — Other Ambulatory Visit: Payer: Self-pay

## 2020-06-10 DIAGNOSIS — Z79899 Other long term (current) drug therapy: Secondary | ICD-10-CM

## 2020-06-10 DIAGNOSIS — J45909 Unspecified asthma, uncomplicated: Secondary | ICD-10-CM | POA: Diagnosis not present

## 2020-06-10 DIAGNOSIS — N529 Male erectile dysfunction, unspecified: Secondary | ICD-10-CM | POA: Diagnosis not present

## 2020-06-10 DIAGNOSIS — K219 Gastro-esophageal reflux disease without esophagitis: Secondary | ICD-10-CM | POA: Diagnosis not present

## 2020-06-10 DIAGNOSIS — R5383 Other fatigue: Secondary | ICD-10-CM | POA: Diagnosis not present

## 2020-06-10 DIAGNOSIS — E669 Obesity, unspecified: Secondary | ICD-10-CM | POA: Diagnosis not present

## 2020-06-10 DIAGNOSIS — I10 Essential (primary) hypertension: Secondary | ICD-10-CM | POA: Diagnosis not present

## 2020-06-10 DIAGNOSIS — G4733 Obstructive sleep apnea (adult) (pediatric): Secondary | ICD-10-CM | POA: Diagnosis not present

## 2020-06-10 NOTE — Progress Notes (Signed)
Date:  06/10/2020   HPI: Craig Cole is a 58 y.o. male who presents to the [facility] for HIV PrEP follow-up.  Insured   [x]    Uninsured  []    There are no problems to display for this patient.   Patient's Medications  New Prescriptions   No medications on file  Previous Medications   ASPIRIN EC 81 MG TABLET    Take 81 mg by mouth daily.   DILTIAZEM (CARDIZEM CD) 240 MG 24 HR CAPSULE    Take 240 mg by mouth daily.   DORZOLAMIDE-TIMOLOL (COSOPT) 22.3-6.8 MG/ML OPHTHALMIC SOLUTION    Place 1 drop into the left eye 2 (two) times daily.   EMTRICITABINE-TENOFOVIR AF (DESCOVY) 200-25 MG TABLET    Take 1 tablet by mouth daily.   ESOMEPRAZOLE (NEXIUM) 40 MG CAPSULE    Take 40 mg by mouth every morning.   LISINOPRIL (ZESTRIL) 20 MG TABLET    Take 20 mg by mouth daily.   MONTELUKAST (SINGULAIR) 10 MG TABLET    Take 10 mg by mouth at bedtime.   SIMVASTATIN (ZOCOR) 40 MG TABLET    Take 40 mg by mouth at bedtime.   TADALAFIL 2.5 MG TABS    Take 2.5 mg by mouth daily.   TAFLUPROST, PF, 0.0015 % SOLN    Place 0.0015 % into both eyes at bedtime.   TAMSULOSIN (FLOMAX) 0.4 MG CAPS CAPSULE    Take 0.4 mg by mouth at bedtime.  Modified Medications   No medications on file  Discontinued Medications   No medications on file    Allergies: Not on File  Past Medical History: No past medical history on file.  Social History: Social History   Socioeconomic History   Marital status: Single    Spouse name: Not on file   Number of children: Not on file   Years of education: Not on file   Highest education level: Not on file  Occupational History   Not on file  Tobacco Use   Smoking status: Not on file   Smokeless tobacco: Not on file  Substance and Sexual Activity   Alcohol use: Not on file   Drug use: Not on file   Sexual activity: Not on file  Other Topics Concern   Not on file  Social History Narrative   Not on file   Social Determinants of Health    Financial Resource Strain: Not on file  Food Insecurity: Not on file  Transportation Needs: Not on file  Physical Activity: Not on file  Stress: Not on file  Social Connections: Not on file    CHL HIV PREP FLOWSHEET RESULTS 07/12/2019  Insurance Status Insured  Gender at birth Male  Gender identity cis-Male  Risk for HIV Condomless vaginal or anal intercourse;Hx of STI  Sex Partners Men only  # sex partners past 3-6 mos 4-6  Sex activity preferences Oral;Receptive  Condom use No  HIV symptoms? N/A    Labs:  SCr: Lab Results  Component Value Date   CREATININE 0.77 10/17/2019   CREATININE 0.73 07/12/2019   HIV Lab Results  Component Value Date   HIV NON-REACTIVE 03/11/2020   HIV NON-REACTIVE 10/17/2019   HIV NON-REACTIVE 07/12/2019   Hepatitis B Lab Results  Component Value Date   HEPBSAB NON-REACTIVE 07/12/2019   HEPBSAG NON-REACTIVE 07/12/2019   Hepatitis C Lab Results  Component Value Date   HEPCAB NON-REACTIVE 07/12/2019   Hepatitis A Lab Results  Component Value Date  HAV REACTIVE (A) 07/12/2019   RPR and STI Lab Results  Component Value Date   LABRPR REACTIVE (A) 03/11/2020   LABRPR REACTIVE (A) 07/12/2019   RPRTITER 1:4 (H) 03/11/2020   RPRTITER 1:8 (H) 07/12/2019    STI Results GC CT  03/11/2020 Negative Negative  07/12/2019 Negative Negative    Assessment: Craig Cole is here today to follow up for HIV PrEP. He takes his Descovy every day with no issues or missed doses. He has not had any new partners since the last time I saw him. No issues today or concerns. He has an appointment with a new PCP this afternoon. Will check labs, have him follow up with one of our ID NPs in 3 months, and then follow up with me again 3 months after.  Plan: - HIV antibody + BMET today - Descovy x 3 months if HIV negative - F/u with Craig Cole 3/22 at 930am - F/u with me again 6/21 at Craig Cole. Craig Cole, PharmD, BCIDP, AAHIVP, CPP Clinical Pharmacist  Practitioner Infectious Diseases [place] [facility] 06/10/2020, 11:38 AM

## 2020-06-11 ENCOUNTER — Encounter: Payer: Self-pay | Admitting: Pharmacist

## 2020-06-11 ENCOUNTER — Other Ambulatory Visit: Payer: Self-pay | Admitting: Pharmacist

## 2020-06-11 DIAGNOSIS — Z79899 Other long term (current) drug therapy: Secondary | ICD-10-CM

## 2020-06-11 LAB — BASIC METABOLIC PANEL
BUN: 11 mg/dL (ref 7–25)
CO2: 25 mmol/L (ref 20–32)
Calcium: 9.4 mg/dL (ref 8.6–10.3)
Chloride: 107 mmol/L (ref 98–110)
Creat: 0.72 mg/dL (ref 0.70–1.25)
Glucose, Bld: 80 mg/dL (ref 65–99)
Potassium: 4.1 mmol/L (ref 3.5–5.3)
Sodium: 142 mmol/L (ref 135–146)

## 2020-06-11 LAB — HIV ANTIBODY (ROUTINE TESTING W REFLEX): HIV 1&2 Ab, 4th Generation: NONREACTIVE

## 2020-06-11 MED ORDER — DESCOVY 200-25 MG PO TABS: 1.0000 | ORAL_TABLET | Freq: Every day | ORAL | 0 refills | Status: DC

## 2020-06-11 NOTE — Progress Notes (Signed)
Patient's HIV antibody is negative.  Will send in 3 more months of Descovy to [facility].  

## 2020-06-26 DIAGNOSIS — I10 Essential (primary) hypertension: Secondary | ICD-10-CM | POA: Diagnosis not present

## 2020-06-26 DIAGNOSIS — E785 Hyperlipidemia, unspecified: Secondary | ICD-10-CM | POA: Diagnosis not present

## 2020-06-26 DIAGNOSIS — L980 Pyogenic granuloma: Secondary | ICD-10-CM | POA: Diagnosis not present

## 2020-06-26 DIAGNOSIS — K219 Gastro-esophageal reflux disease without esophagitis: Secondary | ICD-10-CM | POA: Diagnosis not present

## 2020-07-05 ENCOUNTER — Encounter (HOSPITAL_COMMUNITY): Payer: Self-pay

## 2020-07-05 ENCOUNTER — Emergency Department (HOSPITAL_COMMUNITY)
Admission: EM | Admit: 2020-07-05 | Discharge: 2020-07-05 | Disposition: A | Discharge status: Home / Self Care | Payer: BC Managed Care – PPO | Attending: Emergency Medicine | Admitting: Emergency Medicine

## 2020-07-05 ENCOUNTER — Other Ambulatory Visit: Payer: Self-pay

## 2020-07-05 ENCOUNTER — Emergency Department (HOSPITAL_COMMUNITY): Payer: BC Managed Care – PPO

## 2020-07-05 DIAGNOSIS — Z7982 Long term (current) use of aspirin: Secondary | ICD-10-CM | POA: Insufficient documentation

## 2020-07-05 DIAGNOSIS — I10 Essential (primary) hypertension: Secondary | ICD-10-CM | POA: Insufficient documentation

## 2020-07-05 DIAGNOSIS — K2100 Gastro-esophageal reflux disease with esophagitis, without bleeding: Secondary | ICD-10-CM | POA: Diagnosis not present

## 2020-07-05 DIAGNOSIS — R1013 Epigastric pain: Secondary | ICD-10-CM | POA: Diagnosis not present

## 2020-07-05 DIAGNOSIS — Z79899 Other long term (current) drug therapy: Secondary | ICD-10-CM | POA: Diagnosis not present

## 2020-07-05 DIAGNOSIS — Z85828 Personal history of other malignant neoplasm of skin: Secondary | ICD-10-CM | POA: Diagnosis not present

## 2020-07-05 DIAGNOSIS — R079 Chest pain, unspecified: Secondary | ICD-10-CM | POA: Diagnosis not present

## 2020-07-05 DIAGNOSIS — R12 Heartburn: Secondary | ICD-10-CM | POA: Diagnosis not present

## 2020-07-05 HISTORY — DX: Sleep apnea, unspecified: G47.30

## 2020-07-05 HISTORY — DX: Hyperlipidemia, unspecified: E78.5

## 2020-07-05 HISTORY — DX: Essential (primary) hypertension: I10

## 2020-07-05 HISTORY — DX: Gastro-esophageal reflux disease without esophagitis: K21.9

## 2020-07-05 HISTORY — DX: Malignant (primary) neoplasm, unspecified: C80.1

## 2020-07-05 LAB — COMPREHENSIVE METABOLIC PANEL
ALT: 26 U/L (ref 0–44)
AST: 21 U/L (ref 15–41)
Albumin: 3.7 g/dL (ref 3.5–5.0)
Alkaline Phosphatase: 82 U/L (ref 38–126)
Anion gap: 9 (ref 5–15)
BUN: 13 mg/dL (ref 8–23)
CO2: 25 mmol/L (ref 22–32)
Calcium: 9.1 mg/dL (ref 8.9–10.3)
Chloride: 105 mmol/L (ref 98–111)
Creatinine, Ser: 0.73 mg/dL (ref 0.61–1.24)
GFR, Estimated: >60 mL/min (ref >60)
Glucose, Bld: 141 mg/dL — ABNORMAL HIGH (ref 70–99)
Potassium: 3.8 mmol/L (ref 3.5–5.1)
Sodium: 139 mmol/L (ref 135–145)
Total Bilirubin: 0.6 mg/dL (ref 0.3–1.2)
Total Protein: 7.1 g/dL (ref 6.5–8.1)

## 2020-07-05 LAB — CBC WITH DIFFERENTIAL/PLATELET
Abs Immature Granulocytes: 0.02 10*3/uL (ref 0.00–0.07)
Basophils Absolute: 0 10*3/uL (ref 0.0–0.1)
Basophils Relative: 0 %
Eosinophils Absolute: 0.1 10*3/uL (ref 0.0–0.5)
Eosinophils Relative: 2 %
HCT: 44.1 % (ref 39.0–52.0)
Hemoglobin: 14.5 g/dL (ref 13.0–17.0)
Immature Granulocytes: 0 %
Lymphocytes Relative: 19 %
Lymphs Abs: 1.3 10*3/uL (ref 0.7–4.0)
MCH: 30.9 pg (ref 26.0–34.0)
MCHC: 32.9 g/dL (ref 30.0–36.0)
MCV: 93.8 fL (ref 80.0–100.0)
Monocytes Absolute: 0.5 10*3/uL (ref 0.1–1.0)
Monocytes Relative: 7 %
Neutro Abs: 4.8 10*3/uL (ref 1.7–7.7)
Neutrophils Relative %: 72 %
Platelets: 212 10*3/uL (ref 150–400)
RBC: 4.7 MIL/uL (ref 4.22–5.81)
RDW: 13.3 % (ref 11.5–15.5)
WBC: 6.7 10*3/uL (ref 4.0–10.5)
nRBC: 0 % (ref 0.0–0.2)

## 2020-07-05 LAB — TROPONIN I (HIGH SENSITIVITY)
Troponin I (High Sensitivity): 13 ng/L (ref <18)
Troponin I (High Sensitivity): 12 ng/L (ref <18)

## 2020-07-05 LAB — LIPASE, BLOOD: Lipase: 34 U/L (ref 11–51)

## 2020-07-05 MED ORDER — PANTOPRAZOLE SODIUM 40 MG IV SOLR
40.0000 mg | Freq: Once | INTRAVENOUS | Status: AC
  Administered 2020-07-05: 40 mg via INTRAVENOUS
  Filled 2020-07-05: qty 40

## 2020-07-05 MED ORDER — ONDANSETRON HCL 4 MG/2ML IJ SOLN
4.0000 mg | Freq: Once | INTRAMUSCULAR | Status: AC
  Administered 2020-07-05: 4 mg via INTRAVENOUS
  Filled 2020-07-05: qty 2

## 2020-07-05 NOTE — Discharge Instructions (Addendum)
Increase your Nexium so you are taking it twice a day and follow-up with your primary care doctor next week for recheck

## 2020-07-05 NOTE — ED Notes (Signed)
Patient transported to X-ray 

## 2020-07-05 NOTE — ED Triage Notes (Signed)
Pt reports increase heart burn, worse at night. Pt reports that he feels chest tightness this morning and feels like it goes up into his head

## 2020-07-05 NOTE — ED Provider Notes (Signed)
[facility] EMERGENCY DEPARTMENT Provider Note   CSN: [id number] Arrival date & time: 07/05/20  5053     History Chief Complaint  Patient presents with  . Chest Pain    Craig Cole is a 59 y.o. male.  Patient complains of epigastric pain mostly at night when he is laying down.  He does take Nexium once a day.  Patient had minimal discomfort now  The history is provided by the patient and medical records. No language interpreter was used.  Chest Pain Pain location:  Epigastric Pain quality: aching   Pain radiates to:  Does not radiate Pain severity:  Mild Onset quality:  Sudden Timing:  Intermittent Progression:  Waxing and waning Chronicity:  Recurrent Associated symptoms: abdominal pain   Associated symptoms: no back pain, no cough, no fatigue and no headache        Past Medical History:  Diagnosis Date  . Cancer ([place])    skin cancer  . GERD (gastroesophageal reflux disease)   . Hyperlipidemia   . Hypertension   . Sleep apnea     There are no problems to display for this patient.   Past Surgical History:  Procedure Laterality Date  . TONSILLECTOMY         No family history on file.  Social History   Tobacco Use  . Smoking status: Never Smoker  Substance Use Topics  . Alcohol use: Never  . Drug use: Never    Home Medications Prior to Admission medications   Medication Sig Start Date End Date Taking? Authorizing Provider  aspirin EC 81 MG tablet Take 81 mg by mouth daily.   Yes [provider]  diltiazem (CARDIZEM CD) 240 MG 24 hr capsule Take 240 mg by mouth daily. 05/31/19  Yes [provider]  dorzolamide-timolol (COSOPT) 22.3-6.8 MG/ML ophthalmic solution Place 1 drop into the left eye 2 (two) times daily.   Yes [provider]  emtricitabine-tenofovir AF (DESCOVY) 200-25 MG tablet Take 1 tablet by mouth daily. 06/11/20  Yes Kuppelweiser, Cassie L, RPH-CPP  esomeprazole (NEXIUM) 40 MG capsule Take 40 mg by mouth  every morning. 05/31/19  Yes [provider]  lisinopril (ZESTRIL) 40 MG tablet Take 1 tablet by mouth daily. 06/27/20  Yes [provider]  montelukast (SINGULAIR) 10 MG tablet Take 10 mg by mouth at bedtime. 05/31/19  Yes [provider]  simvastatin (ZOCOR) 40 MG tablet Take 40 mg by mouth at bedtime. 05/31/19  Yes [provider]  Tadalafil 2.5 MG TABS Take 2.5 mg by mouth daily. 05/30/19  Yes [provider]  Tafluprost, PF, 0.0015 % SOLN Place 0.0015 % into both eyes at bedtime.   Yes [provider]  tamsulosin (FLOMAX) 0.4 MG CAPS capsule Take 0.4 mg by mouth at bedtime. 05/31/19  Yes [provider]    Allergies    Patient has no known allergies.  Review of Systems   Review of Systems  Constitutional: Negative for appetite change and fatigue.  HENT: Negative for congestion, ear discharge and sinus pressure.   Eyes: Negative for discharge.  Respiratory: Negative for cough.   Cardiovascular: Positive for chest pain.  Gastrointestinal: Positive for abdominal pain. Negative for diarrhea.  Genitourinary: Negative for frequency and hematuria.  Musculoskeletal: Negative for back pain.  Skin: Negative for rash.  Neurological: Negative for seizures and headaches.  Psychiatric/Behavioral: Negative for hallucinations.    Physical Exam Updated Vital Signs BP 130/75   Pulse (!) 56   Temp 98 F (  36.7 C) (Oral)   Resp 17   Ht 6\' 5"  (1.956 m)   Wt (!) 162.8 kg   SpO2 96%   BMI 42.57 kg/m   Physical Exam Vitals and nursing note reviewed.  Constitutional:      Appearance: He is well-developed.  HENT:     Head: Normocephalic.  Eyes:     General: No scleral icterus.    Extraocular Movements: EOM normal.     Conjunctiva/sclera: Conjunctivae normal.  Neck:     Thyroid: No thyromegaly.  Cardiovascular:     Rate and Rhythm: Normal rate and regular rhythm.     Heart sounds: No murmur heard. No friction rub. No gallop.    Pulmonary:     Breath sounds: No stridor. No wheezing or rales.  Chest:     Chest wall: No tenderness.  Abdominal:     General: There is no distension.     Tenderness: There is abdominal tenderness. There is no rebound.  Musculoskeletal:        General: No edema. Normal range of motion.     Cervical back: Neck supple.  Lymphadenopathy:     Cervical: No cervical adenopathy.  Skin:    Findings: No erythema or rash.  Neurological:     Mental Status: He is alert and oriented to person, place, and time.     Motor: No abnormal muscle tone.     Coordination: Coordination normal.  Psychiatric:        Mood and Affect: Mood and affect normal.        Behavior: Behavior normal.     ED Results / Procedures / Treatments   Labs (all labs ordered are listed, but only abnormal results are displayed) Labs Reviewed  COMPREHENSIVE METABOLIC PANEL - Abnormal; Notable for the following components:      Result Value   Glucose, Bld 141 (*)    All other components within normal limits  CBC WITH DIFFERENTIAL/PLATELET  LIPASE, BLOOD  TROPONIN I (HIGH SENSITIVITY)  TROPONIN I (HIGH SENSITIVITY)    EKG None  Radiology DG ABD ACUTE 2+V W 1V CHEST  Result Date: 07/05/2020 CLINICAL DATA:  Chest tightness.  Heartburn. EXAM: DG ABDOMEN ACUTE WITH 1 VIEW CHEST COMPARISON:  None. FINDINGS: The cardiac silhouette is borderline enlarged. The thoracic aorta is mildly tortuous. No airspace consolidation, edema, sizable pleural effusion, or pneumothorax is identified. There is slight elevation of the right hemidiaphragm. No intraperitoneal free air is identified. No dilated loops of bowel are seen to suggest obstruction. Mild degenerative changes are noted at the hips, and there is mild lumbar levoscoliosis. IMPRESSION: No evidence of acute cardiopulmonary disease. Nonobstructed bowel gas pattern. Electronically Signed   By: Logan Bores M.D.   On: 07/05/2020 08:50    Procedures Procedures (including  critical care time)  Medications Ordered in ED Medications  pantoprazole (PROTONIX) injection 40 mg (40 mg Intravenous Given 07/05/20 0759)  ondansetron (ZOFRAN) injection 4 mg (4 mg Intravenous Given 07/05/20 0759)    ED Course  I have reviewed the triage vital signs and the nursing notes.  Pertinent labs & imaging results that were available during my care of the patient were reviewed by me and considered in my medical decision making (see chart for details).    MDM Rules/Calculators/A&P                          Patient with abdominal discomfort most likely peptic ulcer disease.  He will  increase his Nexium to twice a day.  Labs unremarkable except mildly elevated glucose.  Patient has two troponins that are negative.  EKG unremarkable.  Doubt coronary disease.  He will increase his Nexium and see his PCP next week Final Clinical Impression(s) / ED Diagnoses Final diagnoses:  Gastroesophageal reflux disease with esophagitis without hemorrhage    Rx / DC Orders ED Discharge Orders    None       Milton Ferguson, MD 07/05/20 1110

## 2020-07-26 DIAGNOSIS — H401131 Primary open-angle glaucoma, bilateral, mild stage: Secondary | ICD-10-CM | POA: Diagnosis not present

## 2020-07-26 DIAGNOSIS — G4733 Obstructive sleep apnea (adult) (pediatric): Secondary | ICD-10-CM | POA: Diagnosis not present

## 2020-08-23 DIAGNOSIS — G4733 Obstructive sleep apnea (adult) (pediatric): Secondary | ICD-10-CM | POA: Diagnosis not present

## 2020-08-26 DIAGNOSIS — Z08 Encounter for follow-up examination after completed treatment for malignant neoplasm: Secondary | ICD-10-CM | POA: Diagnosis not present

## 2020-08-26 DIAGNOSIS — Z85820 Personal history of malignant melanoma of skin: Secondary | ICD-10-CM | POA: Diagnosis not present

## 2020-08-26 DIAGNOSIS — Z1283 Encounter for screening for malignant neoplasm of skin: Secondary | ICD-10-CM | POA: Diagnosis not present

## 2020-08-26 DIAGNOSIS — H401131 Primary open-angle glaucoma, bilateral, mild stage: Secondary | ICD-10-CM | POA: Diagnosis not present

## 2020-08-27 ENCOUNTER — Other Ambulatory Visit: Payer: Self-pay | Admitting: Pharmacist

## 2020-08-27 DIAGNOSIS — Z79899 Other long term (current) drug therapy: Secondary | ICD-10-CM

## 2020-09-10 ENCOUNTER — Other Ambulatory Visit: Payer: Self-pay

## 2020-09-10 ENCOUNTER — Encounter: Payer: Self-pay | Admitting: Family

## 2020-09-10 ENCOUNTER — Other Ambulatory Visit (HOSPITAL_COMMUNITY)
Admission: RE | Admit: 2020-09-10 | Discharge: 2020-09-10 | Disposition: A | Discharge status: Home / Self Care | Payer: BC Managed Care – PPO | Source: Ambulatory Visit | Attending: Family | Admitting: Family

## 2020-09-10 ENCOUNTER — Ambulatory Visit (INDEPENDENT_AMBULATORY_CARE_PROVIDER_SITE_OTHER): Payer: BC Managed Care – PPO | Admitting: Family

## 2020-09-10 VITALS — BP 156/98 | HR 62 | Temp 98.0°F | Ht 77.0 in | Wt 376.0 lb

## 2020-09-10 DIAGNOSIS — Z79899 Other long term (current) drug therapy: Secondary | ICD-10-CM | POA: Insufficient documentation

## 2020-09-10 DIAGNOSIS — E669 Obesity, unspecified: Secondary | ICD-10-CM | POA: Diagnosis not present

## 2020-09-10 DIAGNOSIS — F5104 Psychophysiologic insomnia: Secondary | ICD-10-CM | POA: Diagnosis not present

## 2020-09-10 DIAGNOSIS — R5383 Other fatigue: Secondary | ICD-10-CM | POA: Diagnosis not present

## 2020-09-10 DIAGNOSIS — R739 Hyperglycemia, unspecified: Secondary | ICD-10-CM | POA: Diagnosis not present

## 2020-09-10 DIAGNOSIS — E785 Hyperlipidemia, unspecified: Secondary | ICD-10-CM | POA: Diagnosis not present

## 2020-09-10 DIAGNOSIS — Z0001 Encounter for general adult medical examination with abnormal findings: Secondary | ICD-10-CM | POA: Diagnosis not present

## 2020-09-10 DIAGNOSIS — I10 Essential (primary) hypertension: Secondary | ICD-10-CM | POA: Diagnosis not present

## 2020-09-10 DIAGNOSIS — N400 Enlarged prostate without lower urinary tract symptoms: Secondary | ICD-10-CM | POA: Diagnosis not present

## 2020-09-10 DIAGNOSIS — Z7251 High risk heterosexual behavior: Secondary | ICD-10-CM

## 2020-09-10 DIAGNOSIS — G4733 Obstructive sleep apnea (adult) (pediatric): Secondary | ICD-10-CM | POA: Diagnosis not present

## 2020-09-10 MED ORDER — DESCOVY 200-25 MG PO TABS: 1.0000 | ORAL_TABLET | Freq: Every day | ORAL | 0 refills | Status: DC

## 2020-09-10 NOTE — Progress Notes (Signed)
Subjective:    Patient ID: Craig Cole, male    DOB: 04-26-1961, 60 y.o.   MRN: [id number]  Chief Complaint  Patient presents with  . PrEP     HPI:  Craig Cole is a 60 y.o. male on closure prophylaxis for HIV with Descovy last seen on 06/10/2020 by Magda Kiel, RPH-CPP for routine follow-up with HIV testing negative.  Here today for routine follow-up.  Craig Cole has been taking his Descovy daily as prescribed with no adverse side effects or missed doses since his last office visit.  Overall feeling well today with no new concerns/complaints. Denies fevers, chills, night sweats, headaches, changes in vision, neck pain/stiffness, nausea, diarrhea, vomiting, lesions or rashes.  Craig Cole has no problems obtaining Descovy from the pharmacy and remains covered through [facility].   No Known Allergies    Outpatient Medications Prior to Visit  Medication Sig Dispense Refill  . aspirin EC 81 MG tablet Take 81 mg by mouth daily.    Marland Kitchen diltiazem (CARDIZEM CD) 240 MG 24 hr capsule Take 240 mg by mouth daily.    . dorzolamide-timolol (COSOPT) 22.3-6.8 MG/ML ophthalmic solution Place 1 drop into the left eye 2 (two) times daily.    Marland Kitchen esomeprazole (NEXIUM) 40 MG capsule Take 40 mg by mouth every morning.    Marland Kitchen lisinopril (ZESTRIL) 40 MG tablet Take 1 tablet by mouth daily.    . montelukast (SINGULAIR) 10 MG tablet Take 10 mg by mouth at bedtime.    . simvastatin (ZOCOR) 40 MG tablet Take 40 mg by mouth at bedtime.    . Tadalafil 2.5 MG TABS Take 2.5 mg by mouth daily.    . Tafluprost, PF, 0.0015 % SOLN Place 0.0015 % into both eyes at bedtime.    . tamsulosin (FLOMAX) 0.4 MG CAPS capsule Take 0.4 mg by mouth at bedtime.    Marland Kitchen emtricitabine-tenofovir AF (DESCOVY) 200-25 MG tablet Take 1 tablet by mouth daily. 90 tablet 0   No facility-administered medications prior to visit.     Past Medical History:  Diagnosis Date  . Cancer ([place])    skin cancer  . GERD  (gastroesophageal reflux disease)   . Hyperlipidemia   . Hypertension   . Sleep apnea      Past Surgical History:  Procedure Laterality Date  . TONSILLECTOMY         Review of Systems  Constitutional: Negative for appetite change, chills, fatigue, fever and unexpected weight change.  Eyes: Negative for visual disturbance.  Respiratory: Negative for cough, chest tightness, shortness of breath and wheezing.   Cardiovascular: Negative for chest pain and leg swelling.  Gastrointestinal: Negative for abdominal pain, constipation, diarrhea, nausea and vomiting.  Genitourinary: Negative for dysuria, flank pain, frequency, genital sores, hematuria and urgency.  Skin: Negative for rash.  Allergic/Immunologic: Negative for immunocompromised state.  Neurological: Negative for dizziness and headaches.      Objective:    BP (!) 156/98   Pulse 62   Temp 98 F (36.7 C)   Ht 6\' 5"  (1.956 m)   Wt (!) 376 lb (170.6 kg)   BMI 44.59 kg/m  Nursing note and vital signs reviewed.  Physical Exam Constitutional:      General: He is not in acute distress.    Appearance: He is well-developed. He is obese.  Eyes:     Conjunctiva/sclera: Conjunctivae normal.  Cardiovascular:     Rate and Rhythm: Normal rate and regular rhythm.     Heart  sounds: Normal heart sounds. No murmur heard. No friction rub. No gallop.   Pulmonary:     Effort: Pulmonary effort is normal. No respiratory distress.     Breath sounds: Normal breath sounds. No wheezing or rales.  Chest:     Chest wall: No tenderness.  Abdominal:     General: Bowel sounds are normal.     Palpations: Abdomen is soft.     Tenderness: There is no abdominal tenderness.  Musculoskeletal:     Cervical back: Neck supple.  Lymphadenopathy:     Cervical: No cervical adenopathy.  Skin:    General: Skin is warm and dry.     Findings: No rash.  Neurological:     Mental Status: He is alert and oriented to person, place, and time.   Psychiatric:        Behavior: Behavior normal.        Thought Content: Thought content normal.        Judgment: Judgment normal.      Depression screen PHQ 2/9 09/10/2020  Decreased Interest 0  Down, Depressed, Hopeless 0  PHQ - 2 Score 0       Assessment & Plan:    Patient Active Problem List   Diagnosis Date Noted  . On pre-exposure prophylaxis for HIV 09/10/2020  . High risk sexual behavior 09/10/2020     Problem List Items Addressed This Visit      Other   On pre-exposure prophylaxis for HIV   Relevant Medications   emtricitabine-tenofovir AF (DESCOVY) 200-25 MG tablet   Other Relevant Orders   RPR   HIV Antibody (routine testing w rflx)   Cytology (oral, anal, urethral) ancillary only   Cytology (oral, anal, urethral) ancillary only   Urine cytology ancillary only(Page)   High risk sexual behavior - Primary    Craig Cole continues to take his Descovy daily as prescribed with no adverse side effects.  Check HIV and STD lab work today.  Discussed importance of safe sexual practice to reduce risk of STI.  Condoms declined. Continue current dose of Descovy.  Plan for follow-up in 3 months or sooner if needed with lab work on the same day.          I am having Craig Cole "Craig Cole" maintain his diltiazem, esomeprazole, montelukast, simvastatin, Tadalafil, tamsulosin, aspirin EC, dorzolamide-timolol, Tafluprost (PF), lisinopril, and Descovy.   Meds ordered this encounter  Medications  . emtricitabine-tenofovir AF (DESCOVY) 200-25 MG tablet    Sig: Take 1 tablet by mouth daily.    Dispense:  90 tablet    Refill:  0    Order Specific Question:   Supervising Provider    Answer:   Carlyle Basques [[id number]]     Follow-up: Return in about 3 months (around 12/11/2020), or if symptoms worsen or fail to improve.   Terri Piedra, MSN, FNP-C Nurse Practitioner [facility] for Infectious Disease [place] number: [phone]

## 2020-09-10 NOTE — Patient Instructions (Signed)
Nice to see you.   Continue to take Descovy daily as prescribed.   Refills have been sent to the pharmacy.   We will check your lab work today.  Plan for follow up in 3 months or sooner if needed.   Have a great day and stay safe!

## 2020-09-10 NOTE — Assessment & Plan Note (Addendum)
Craig Cole continues to take his Descovy daily as prescribed with no adverse side effects.  Check HIV and STD lab work today.  Discussed importance of safe sexual practice to reduce risk of STI.  Condoms declined. Continue current dose of Descovy.  Plan for follow-up in 3 months or sooner if needed with lab work on the same day.

## 2020-09-11 LAB — URINE CYTOLOGY ANCILLARY ONLY
Chlamydia: NEGATIVE
Comment: NEGATIVE
Comment: NORMAL
Neisseria Gonorrhea: NEGATIVE

## 2020-09-11 LAB — CYTOLOGY, (ORAL, ANAL, URETHRAL) ANCILLARY ONLY
Chlamydia: NEGATIVE
Chlamydia: POSITIVE — AB
Comment: NEGATIVE
Comment: NEGATIVE
Comment: NORMAL
Comment: NORMAL
Neisseria Gonorrhea: NEGATIVE
Neisseria Gonorrhea: NEGATIVE

## 2020-09-12 ENCOUNTER — Other Ambulatory Visit: Payer: Self-pay | Admitting: Family

## 2020-09-12 LAB — RPR TITER: RPR Titer: 1:4 {titer} — ABNORMAL HIGH

## 2020-09-12 LAB — FLUORESCENT TREPONEMAL AB(FTA)-IGG-BLD: Fluorescent Treponemal ABS: REACTIVE — AB

## 2020-09-12 LAB — RPR: RPR Ser Ql: REACTIVE — AB

## 2020-09-12 LAB — HIV ANTIBODY (ROUTINE TESTING W REFLEX): HIV 1&2 Ab, 4th Generation: NONREACTIVE

## 2020-09-12 MED ORDER — DOXYCYCLINE HYCLATE 100 MG PO TABS: 100.0000 mg | ORAL_TABLET | Freq: Two times a day (BID) | ORAL | 0 refills | Status: DC

## 2020-09-12 NOTE — Progress Notes (Unsigned)
doxycyc

## 2020-09-20 ENCOUNTER — Other Ambulatory Visit: Payer: Self-pay | Admitting: Family

## 2020-09-20 DIAGNOSIS — Z79899 Other long term (current) drug therapy: Secondary | ICD-10-CM

## 2020-09-23 ENCOUNTER — Encounter: Payer: Self-pay | Admitting: Internal Medicine

## 2020-09-23 DIAGNOSIS — G4733 Obstructive sleep apnea (adult) (pediatric): Secondary | ICD-10-CM | POA: Diagnosis not present

## 2020-10-21 DIAGNOSIS — H401131 Primary open-angle glaucoma, bilateral, mild stage: Secondary | ICD-10-CM | POA: Diagnosis not present

## 2020-10-23 DIAGNOSIS — G4733 Obstructive sleep apnea (adult) (pediatric): Secondary | ICD-10-CM | POA: Diagnosis not present

## 2020-10-30 ENCOUNTER — Ambulatory Visit
Admission: EM | Admit: 2020-10-30 | Discharge: 2020-10-30 | Disposition: A | Discharge status: Home / Self Care | Payer: BC Managed Care – PPO | Attending: Family Medicine | Admitting: Family Medicine

## 2020-10-30 ENCOUNTER — Encounter: Payer: Self-pay | Admitting: Emergency Medicine

## 2020-10-30 ENCOUNTER — Other Ambulatory Visit: Payer: Self-pay

## 2020-10-30 DIAGNOSIS — R6883 Chills (without fever): Secondary | ICD-10-CM

## 2020-10-30 DIAGNOSIS — R5383 Other fatigue: Secondary | ICD-10-CM

## 2020-10-30 DIAGNOSIS — S50362A Insect bite (nonvenomous) of left elbow, initial encounter: Secondary | ICD-10-CM

## 2020-10-30 DIAGNOSIS — W57XXXA Bitten or stung by nonvenomous insect and other nonvenomous arthropods, initial encounter: Secondary | ICD-10-CM

## 2020-10-30 DIAGNOSIS — B349 Viral infection, unspecified: Secondary | ICD-10-CM

## 2020-10-30 NOTE — ED Provider Notes (Signed)
[facility]    CSN: [id number] Arrival date & time: 10/30/20  0920      History   Chief Complaint No chief complaint on file.   HPI Craig Cole is a 60 y.o. male.   Reports body aches and fatigue for the last few days.  Also reports that he was bitten by a tick about 2 weeks ago on his left arm.  Denies redness, swelling, drainage, heat, tenderness at the area.  Denies known sick contacts.  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  Has negative history of COVID.  Has completed COVID vaccines.  Has completed flu vaccine.  Denies headache, cough, abdominal pain, nausea, vomiting, diarrhea, rash, fever, other symptoms.  ROS per HPI  The history is provided by the patient.    Past Medical History:  Diagnosis Date  . Cancer ([place])    skin cancer  . GERD (gastroesophageal reflux disease)   . Hyperlipidemia   . Hypertension   . Sleep apnea     Patient Active Problem List   Diagnosis Date Noted  . On pre-exposure prophylaxis for HIV 09/10/2020  . High risk sexual behavior 09/10/2020    Past Surgical History:  Procedure Laterality Date  . TONSILLECTOMY         Home Medications    Prior to Admission medications   Medication Sig Start Date End Date Taking? Authorizing Provider  aspirin EC 81 MG tablet Take 81 mg by mouth daily.    [provider]  diltiazem (CARDIZEM CD) 240 MG 24 hr capsule Take 240 mg by mouth daily. 05/31/19   [provider]  dorzolamide-timolol (COSOPT) 22.3-6.8 MG/ML ophthalmic solution Place 1 drop into the left eye 2 (two) times daily.    [provider]  doxycycline (VIBRAMYCIN) 100 MG capsule Take 1 capsule (100 mg total) by mouth 2 (two) times daily for 7 days. 11/01/20 11/08/20  Chase Picket, MD  emtricitabine-tenofovir AF (DESCOVY) 200-25 MG tablet Take 1 tablet by mouth daily. 09/10/20   Golden Circle, FNP  esomeprazole (NEXIUM) 40 MG capsule  Take 40 mg by mouth every morning. 05/31/19   [provider]  lisinopril (ZESTRIL) 40 MG tablet Take 1 tablet by mouth daily. 06/27/20   [provider]  montelukast (SINGULAIR) 10 MG tablet Take 10 mg by mouth at bedtime. 05/31/19   [provider]  simvastatin (ZOCOR) 40 MG tablet Take 40 mg by mouth at bedtime. 05/31/19   [provider]  Tadalafil 2.5 MG TABS Take 2.5 mg by mouth daily. 05/30/19   [provider]  Tafluprost, PF, 0.0015 % SOLN Place 0.0015 % into both eyes at bedtime.    [provider]  tamsulosin (FLOMAX) 0.4 MG CAPS capsule Take 0.4 mg by mouth at bedtime. 05/31/19   [provider]    Family History Family History  Problem Relation Age of Onset  . Hypertension Mother   . Diabetes Mother   . Myasthenia gravis Father     Social History Social History   Tobacco Use  . Smoking status: Never Smoker  . Smokeless tobacco: Never Used  Vaping Use  . Vaping Use: Never used  Substance Use Topics  . Alcohol use: Never  . Drug use: Never     Allergies   Patient has no known allergies.   Review of Systems Review of Systems   Physical Exam Triage Vital Signs ED Triage Vitals  Enc Vitals Group     BP 10/30/20 0943 (!) 160/98     Pulse Rate 10/30/20 0943 88     Resp 10/30/20 0943 18     Temp 10/30/20 0943 99 F (37.2 C)     Temp Source 10/30/20 0943 Oral     SpO2 10/30/20 0943 96 %     Weight --      Height --      Head Circumference --      Peak Flow --      Pain Score 10/30/20 0945 7     Pain Loc --      Pain Edu? --      Excl. in [place]? --    No data found.  Updated Vital Signs BP (!) 160/98 (BP Location: Right Arm)   Pulse 88   Temp 99 F (37.2 C) (Oral)   Resp 18   SpO2 96%   Visual Acuity Right Eye Distance:   Left Eye Distance:   Bilateral Distance:    Right Eye Near:   Left Eye Near:    Bilateral Near:     Physical Exam Vitals and nursing note reviewed.   Constitutional:      Appearance: He is well-developed. He is obese. He is ill-appearing.  HENT:     Head: Normocephalic and atraumatic.     Right Ear: Tympanic membrane, ear canal and external ear normal.     Left Ear: Tympanic membrane, ear canal and external ear normal.     Nose: Congestion present.     Mouth/Throat:     Mouth: Mucous membranes are moist.     Pharynx: Posterior oropharyngeal erythema present.     Comments: Cobblestoning present Eyes:     Extraocular Movements: Extraocular movements intact.     Conjunctiva/sclera: Conjunctivae normal.     Pupils: Pupils are equal, round, and reactive to light.  Cardiovascular:     Rate and Rhythm: Normal rate and regular rhythm.     Heart sounds: Normal heart sounds. No murmur heard.   Pulmonary:     Effort: Pulmonary effort is normal. No respiratory distress.     Breath sounds: Normal breath sounds.  Abdominal:     Palpations: Abdomen is soft.     Tenderness: There is no abdominal tenderness.  Musculoskeletal:        General: Normal range of motion.     Cervical back: Normal range of motion and neck supple.  Lymphadenopathy:     Cervical: Cervical adenopathy present.  Skin:    General: Skin is warm and dry.     Capillary Refill: Capillary refill takes less than 2 seconds.  Neurological:     General: No focal deficit present.     Mental Status: He is alert and oriented to person, place, and time.  Psychiatric:        Mood and Affect: Mood normal.        Behavior: Behavior normal.        Thought Content: Thought content normal.      UC Treatments / Results  Labs (all labs ordered are listed, but only abnormal results are displayed) Labs Reviewed  COMPREHENSIVE METABOLIC PANEL - Abnormal; Notable for the following components:      Result Value   Glucose 109 (*)    Creatinine, Ser 0.70 (*)    All other components within normal limits   Narrative:    Performed at:  [facility] [address],  [place], [place]  [id number] Lab Director: Rush Farmer MD, Phone:  [phone]  ROCKY MTN SPOTTED FVR ABS PNL(IGG+IGM) - Abnormal; Notable for the following components:   RMSF IgM 2.14 (*)    All other components within normal limits   Narrative:    Performed at:  [address], [place], [place]  [id number] Lab Director: Rush Farmer MD, Phone:  [phone]  COVID-19, FLU A+B NAA   Narrative:    Test(s) 140142-Influenza A, NAA; 140143-Influenza B, NAA was developed and its performance characteristics determined by [facility]. It has not been cleared or approved by the Food and Drug Administration. Performed at:  [address], [place], [place]  [id number] Lab Director: Rush Farmer MD, Phone:  [phone]  CBC WITH DIFFERENTIAL/PLATELET   Narrative:    Performed at:  [address] [place], [place]  [id number] Lab Director: Rush Farmer MD, Phone:  [phone]  LYME DISEASE SEROLOGY W/REFLEX   Narrative:    Performed at:  [facility] [address], [place], [place]  [id number] Lab Director: Rush Farmer MD, Phone:  [phone]  [facility] [place] ANTIBODIES    EKG   Radiology No results found.  Procedures Procedures (including critical care time)  Medications Ordered in UC Medications - No data to display  Initial Impression / Assessment and Plan / UC Course  I have reviewed the triage vital signs and the nursing notes.  Pertinent labs & imaging results that were available during my care of the patient were reviewed by me and considered in my medical decision making (see chart for details).    Viral illness Chills Fatigue Tick bite  Labs drawn today for CBC, CMP, [place] spotted fever as well as Lyme disease, we will be in contact with any abnormal results that require further treatment Covid and flu swab obtained in office today.   Patient instructed to quarantine until results  are back and negative.   If results are negative, patient may resume daily schedule as tolerated once they are fever free for 24 hours without the use of antipyretic medications.   If results are positive, patient instructed to quarantine for at least 5 days from symptom onset.  If after 5 days symptoms have resolved, may return to work with a well fitting mask for the next 5 days. If symptomatic after day 5, isolation should be extended to 10 days. Patient instructed to follow-up with primary care or with this office as needed.   Patient instructed to follow-up in the ER for trouble swallowing, trouble breathing, other concerning symptoms.    Final Clinical Impressions(s) / UC Diagnoses   Final diagnoses:  Viral illness  Chills  Other fatigue  Tick bite of left elbow, initial encounter     Discharge Instructions     We are checking blood work for tick borne illness today  We will be in contact with any abnormal results   Your COVID and Influenza tests are pending.  You should self quarantine until the test results are back.    Take Tylenol or ibuprofen as needed for fever or discomfort.  Rest and keep yourself hydrated.    Follow-up with your primary care provider if your symptoms are not improving.        ED Prescriptions    None     PDMP not reviewed this encounter.   Faustino Congress, NP 11/02/20 1809

## 2020-10-30 NOTE — Discharge Instructions (Signed)
We are checking blood work for tick borne illness today  We will be in contact with any abnormal results   Your COVID and Influenza tests are pending.  You should self quarantine until the test results are back.    Take Tylenol or ibuprofen as needed for fever or discomfort.  Rest and keep yourself hydrated.    Follow-up with your primary care provider if your symptoms are not improving.

## 2020-10-30 NOTE — ED Triage Notes (Signed)
Achy x a few days.  Feels exhausted.  States he was bitten by a tick 2 weeks ago on left arm.

## 2020-10-30 NOTE — Progress Notes (Deleted)
Referring Provider: Rosita Fire, MD Primary Care Physician:  Rosita Fire, MD Primary Gastroenterologist:  Dr. Gala Romney  No chief complaint on file.   HPI:   Craig Cole is a 60 y.o. male presenting today at the request of Rosita Fire, MD for consult colonoscopy.  Office visit due to BMI.    Past Medical History:  Diagnosis Date  . Cancer ([place])    skin cancer  . GERD (gastroesophageal reflux disease)   . Hyperlipidemia   . Hypertension   . Sleep apnea     Past Surgical History:  Procedure Laterality Date  . TONSILLECTOMY      Current Outpatient Medications  Medication Sig Dispense Refill  . aspirin EC 81 MG tablet Take 81 mg by mouth daily.    Marland Kitchen diltiazem (CARDIZEM CD) 240 MG 24 hr capsule Take 240 mg by mouth daily.    . dorzolamide-timolol (COSOPT) 22.3-6.8 MG/ML ophthalmic solution Place 1 drop into the left eye 2 (two) times daily.    Marland Kitchen doxycycline (VIBRA-TABS) 100 MG tablet Take 1 tablet (100 mg total) by mouth 2 (two) times daily. 14 tablet 0  . emtricitabine-tenofovir AF (DESCOVY) 200-25 MG tablet Take 1 tablet by mouth daily. 90 tablet 0  . esomeprazole (NEXIUM) 40 MG capsule Take 40 mg by mouth every morning.    Marland Kitchen lisinopril (ZESTRIL) 40 MG tablet Take 1 tablet by mouth daily.    . montelukast (SINGULAIR) 10 MG tablet Take 10 mg by mouth at bedtime.    . simvastatin (ZOCOR) 40 MG tablet Take 40 mg by mouth at bedtime.    . Tadalafil 2.5 MG TABS Take 2.5 mg by mouth daily.    . Tafluprost, PF, 0.0015 % SOLN Place 0.0015 % into both eyes at bedtime.    . tamsulosin (FLOMAX) 0.4 MG CAPS capsule Take 0.4 mg by mouth at bedtime.     No current facility-administered medications for this visit.    Allergies as of 10/31/2020  . (No Known Allergies)    Family History  Problem Relation Age of Onset  . Hypertension Mother   . Diabetes Mother   . Myasthenia gravis Father     Social History   Socioeconomic History  . Marital status: Single    Spouse  name: Not on file  . Number of children: Not on file  . Years of education: Not on file  . Highest education level: Not on file  Occupational History  . Not on file  Tobacco Use  . Smoking status: Never Smoker  . Smokeless tobacco: Never Used  Vaping Use  . Vaping Use: Never used  Substance and Sexual Activity  . Alcohol use: Never  . Drug use: Never  . Sexual activity: Not on file  Other Topics Concern  . Not on file  Social History Narrative  . Not on file   Social Determinants of Health   Financial Resource Strain: Not on file  Food Insecurity: Not on file  Transportation Needs: Not on file  Physical Activity: Not on file  Stress: Not on file  Social Connections: Not on file  Intimate Partner Violence: Not on file    Review of Systems: Gen: Denies any fever, chills, fatigue, weight loss, lack of appetite.  CV: Denies chest pain, heart palpitations, peripheral edema, syncope.  Resp: Denies shortness of breath at rest or with exertion. Denies wheezing or cough.  GI: Denies dysphagia or odynophagia. Denies jaundice, hematemesis, fecal incontinence. GU : Denies urinary burning, urinary frequency, urinary  hesitancy MS: Denies joint pain, muscle weakness, cramps, or limitation of movement.  Derm: Denies rash, itching, dry skin Psych: Denies depression, anxiety, memory loss, and confusion Heme: Denies bruising, bleeding, and enlarged lymph nodes.  Physical Exam: There were no vitals taken for this visit. General:   Alert and oriented. Pleasant and cooperative. Well-nourished and well-developed.  Head:  Normocephalic and atraumatic. Eyes:  Without icterus, sclera clear and conjunctiva pink.  Ears:  Normal auditory acuity. Nose:  No deformity, discharge,  or lesions. Mouth:  No deformity or lesions, oral mucosa pink.  Neck:  Supple, without mass or thyromegaly. Lungs:  Clear to auscultation bilaterally. No wheezes, rales, or rhonchi. No distress.  Heart:  S1, S2 present  without murmurs appreciated.  Abdomen:  +BS, soft, non-tender and non-distended. No HSM noted. No guarding or rebound. No masses appreciated.  Rectal:  Deferred  Msk:  Symmetrical without gross deformities. Normal posture. Pulses:  Normal pulses noted. Extremities:  Without clubbing or edema. Neurologic:  Alert and  oriented x4;  grossly normal neurologically. Skin:  Intact without significant lesions or rashes. Cervical Nodes:  No significant cervical adenopathy. Psych:  Alert and cooperative. Normal mood and affect.

## 2020-10-31 ENCOUNTER — Ambulatory Visit: Payer: BC Managed Care – PPO | Admitting: Gastroenterology

## 2020-10-31 LAB — COVID-19, FLU A+B NAA
Influenza A, NAA: NOT DETECTED
Influenza B, NAA: NOT DETECTED
SARS-CoV-2, NAA: NOT DETECTED

## 2020-11-01 ENCOUNTER — Telehealth (HOSPITAL_COMMUNITY): Payer: Self-pay | Admitting: Emergency Medicine

## 2020-11-01 LAB — CBC WITH DIFFERENTIAL/PLATELET
Basophils Absolute: 0 10*3/uL (ref 0.0–0.2)
Basos: 0 %
EOS (ABSOLUTE): 0 10*3/uL (ref 0.0–0.4)
Eos: 0 %
Hematocrit: 42.9 % (ref 37.5–51.0)
Hemoglobin: 15 g/dL (ref 13.0–17.7)
Immature Grans (Abs): 0 10*3/uL (ref 0.0–0.1)
Immature Granulocytes: 0 %
Lymphocytes Absolute: 1.3 10*3/uL (ref 0.7–3.1)
Lymphs: 20 %
MCH: 30.7 pg (ref 26.6–33.0)
MCHC: 35 g/dL (ref 31.5–35.7)
MCV: 88 fL (ref 79–97)
Monocytes Absolute: 0.5 10*3/uL (ref 0.1–0.9)
Monocytes: 8 %
Neutrophils Absolute: 4.7 10*3/uL (ref 1.4–7.0)
Neutrophils: 72 %
Platelets: 168 10*3/uL (ref 150–450)
RBC: 4.88 x10E6/uL (ref 4.14–5.80)
RDW: 14.1 % (ref 11.6–15.4)
WBC: 6.6 10*3/uL (ref 3.4–10.8)

## 2020-11-01 LAB — COMPREHENSIVE METABOLIC PANEL
ALT: 23 IU/L (ref 0–44)
AST: 21 IU/L (ref 0–40)
Albumin/Globulin Ratio: 1.9 (ref 1.2–2.2)
Albumin: 4.5 g/dL (ref 3.8–4.8)
Alkaline Phosphatase: 107 IU/L (ref 44–121)
BUN/Creatinine Ratio: 16 (ref 10–24)
BUN: 11 mg/dL (ref 8–27)
Bilirubin Total: 0.7 mg/dL (ref 0.0–1.2)
CO2: 22 mmol/L (ref 20–29)
Calcium: 8.9 mg/dL (ref 8.6–10.2)
Chloride: 103 mmol/L (ref 96–106)
Creatinine, Ser: 0.7 mg/dL — ABNORMAL LOW (ref 0.76–1.27)
Globulin, Total: 2.4 g/dL (ref 1.5–4.5)
Glucose: 109 mg/dL — ABNORMAL HIGH (ref 65–99)
Potassium: 3.5 mmol/L (ref 3.5–5.2)
Sodium: 137 mmol/L (ref 134–144)
Total Protein: 6.9 g/dL (ref 6.0–8.5)
eGFR: 105 mL/min/{1.73_m2} (ref >59)

## 2020-11-01 LAB — ROCKY MTN SPOTTED FVR ABS PNL(IGG+IGM)
RMSF IgG: NEGATIVE
RMSF IgM: 2.14 index — ABNORMAL HIGH (ref 0.00–0.89)

## 2020-11-01 LAB — LYME DISEASE SEROLOGY W/REFLEX: Lyme Total Antibody EIA: NEGATIVE

## 2020-11-01 MED ORDER — DOXYCYCLINE HYCLATE 100 MG PO CAPS: 100.0000 mg | ORAL_CAPSULE | Freq: Two times a day (BID) | ORAL | 0 refills | Status: AC

## 2020-11-23 DIAGNOSIS — G4733 Obstructive sleep apnea (adult) (pediatric): Secondary | ICD-10-CM | POA: Diagnosis not present

## 2020-11-28 ENCOUNTER — Other Ambulatory Visit: Payer: Self-pay | Admitting: Family

## 2020-11-28 DIAGNOSIS — Z79899 Other long term (current) drug therapy: Secondary | ICD-10-CM

## 2020-12-02 ENCOUNTER — Ambulatory Visit (INDEPENDENT_AMBULATORY_CARE_PROVIDER_SITE_OTHER): Payer: BC Managed Care – PPO | Admitting: Pharmacist

## 2020-12-02 ENCOUNTER — Other Ambulatory Visit: Payer: Self-pay

## 2020-12-02 ENCOUNTER — Other Ambulatory Visit (HOSPITAL_COMMUNITY)
Admission: RE | Admit: 2020-12-02 | Discharge: 2020-12-02 | Disposition: A | Discharge status: Home / Self Care | Payer: BC Managed Care – PPO | Source: Ambulatory Visit | Attending: Family | Admitting: Family

## 2020-12-02 DIAGNOSIS — I10 Essential (primary) hypertension: Secondary | ICD-10-CM | POA: Diagnosis not present

## 2020-12-02 DIAGNOSIS — K219 Gastro-esophageal reflux disease without esophagitis: Secondary | ICD-10-CM | POA: Diagnosis not present

## 2020-12-02 DIAGNOSIS — Z113 Encounter for screening for infections with a predominantly sexual mode of transmission: Secondary | ICD-10-CM | POA: Insufficient documentation

## 2020-12-02 DIAGNOSIS — Z6841 Body Mass Index (BMI) 40.0 and over, adult: Secondary | ICD-10-CM | POA: Diagnosis not present

## 2020-12-02 DIAGNOSIS — Z79899 Other long term (current) drug therapy: Secondary | ICD-10-CM

## 2020-12-02 DIAGNOSIS — N400 Enlarged prostate without lower urinary tract symptoms: Secondary | ICD-10-CM | POA: Diagnosis not present

## 2020-12-02 DIAGNOSIS — H401131 Primary open-angle glaucoma, bilateral, mild stage: Secondary | ICD-10-CM | POA: Diagnosis not present

## 2020-12-02 NOTE — Progress Notes (Signed)
Date:  12/02/2020   HPI: Craig Cole is a 60 y.o. male who presents to the [facility] for HIV PrEP follow-up.  Insured   [x]    Uninsured  []    Patient Active Problem List   Diagnosis Date Noted   On pre-exposure prophylaxis for HIV 09/10/2020   High risk sexual behavior 09/10/2020    Patient's Medications  New Prescriptions   No medications on file  Previous Medications   ASPIRIN EC 81 MG TABLET    Take 81 mg by mouth daily.   DILTIAZEM (CARDIZEM CD) 240 MG 24 HR CAPSULE    Take 240 mg by mouth daily.   DORZOLAMIDE-TIMOLOL (COSOPT) 22.3-6.8 MG/ML OPHTHALMIC SOLUTION    Place 1 drop into the left eye 2 (two) times daily.   EMTRICITABINE-TENOFOVIR AF (DESCOVY) 200-25 MG TABLET    Take 1 tablet by mouth daily.   ESOMEPRAZOLE (NEXIUM) 40 MG CAPSULE    Take 40 mg by mouth every morning.   LISINOPRIL (ZESTRIL) 40 MG TABLET    Take 1 tablet by mouth daily.   MONTELUKAST (SINGULAIR) 10 MG TABLET    Take 10 mg by mouth at bedtime.   SIMVASTATIN (ZOCOR) 40 MG TABLET    Take 40 mg by mouth at bedtime.   TADALAFIL 2.5 MG TABS    Take 2.5 mg by mouth daily.   TAFLUPROST, PF, 0.0015 % SOLN    Place 0.0015 % into both eyes at bedtime.   TAMSULOSIN (FLOMAX) 0.4 MG CAPS CAPSULE    Take 0.4 mg by mouth at bedtime.  Modified Medications   No medications on file  Discontinued Medications   No medications on file    Allergies: No Known Allergies  Past Medical History: Past Medical History:  Diagnosis Date   Cancer ([place])    skin cancer   GERD (gastroesophageal reflux disease)    Hyperlipidemia    Hypertension    Sleep apnea     Social History: Social History   Socioeconomic History   Marital status: Single    Spouse name: Not on file   Number of children: Not on file   Years of education: Not on file   Highest education level: Not on file  Occupational History   Not on file  Tobacco Use   Smoking status: Never   Smokeless tobacco: Never  Vaping Use   Vaping Use:  Never used  Substance and Sexual Activity   Alcohol use: Never   Drug use: Never   Sexual activity: Not on file  Other Topics Concern   Not on file  Social History Narrative   Not on file   Social Determinants of Health   Financial Resource Strain: Not on file  Food Insecurity: Not on file  Transportation Needs: Not on file  Physical Activity: Not on file  Stress: Not on file  Social Connections: Not on file    CHL HIV PREP FLOWSHEET RESULTS 07/12/2019  Insurance Status Insured  Gender at birth Male  Gender identity cis-Male  Risk for HIV Condomless vaginal or anal intercourse;Hx of STI  Sex Partners Men only  # sex partners past 3-6 mos 4-6  Sex activity preferences Oral;Receptive  Condom use No  HIV symptoms? N/A    Labs:  SCr: Lab Results  Component Value Date   CREATININE 0.70 (L) 10/30/2020   CREATININE 0.73 07/05/2020   CREATININE 0.72 06/10/2020   CREATININE 0.77 10/17/2019   CREATININE 0.73 07/12/2019   HIV Lab Results  Component  Value Date   HIV NON-REACTIVE 09/10/2020   HIV NON-REACTIVE 06/10/2020   HIV NON-REACTIVE 03/11/2020   HIV NON-REACTIVE 10/17/2019   HIV NON-REACTIVE 07/12/2019   Hepatitis B Lab Results  Component Value Date   HEPBSAB NON-REACTIVE 07/12/2019   HEPBSAG NON-REACTIVE 07/12/2019   Hepatitis C Lab Results  Component Value Date   HEPCAB NON-REACTIVE 07/12/2019   Hepatitis A Lab Results  Component Value Date   HAV REACTIVE (A) 07/12/2019   RPR and STI Lab Results  Component Value Date   LABRPR REACTIVE (A) 09/10/2020   LABRPR REACTIVE (A) 03/11/2020   LABRPR REACTIVE (A) 07/12/2019   RPRTITER 1:4 (H) 09/10/2020   RPRTITER 1:4 (H) 03/11/2020   RPRTITER 1:8 (H) 07/12/2019    STI Results GC CT  09/10/2020 Negative Negative  09/10/2020 Negative Positive(A)  09/10/2020 Negative Negative  03/11/2020 Negative Negative  07/12/2019 Negative Negative    Assessment: Craig Cole presents today for 3 month follow up for HIV  PrEP. Continues on Descovy daily. Denies adverse effects or issues obtaining medication from his pharmacy. Recently treated with doxycycline for [facility] Spotted Fever in May. Otherwise no new medications started since his last visit.   Will coordinate his next visit with another appointment he already has scheduled so he can take only one day off work. He has an unopened bottle of Descovy at home so he will have enough medication to last until his next visit after refills are provided following today's visit if he remains HIV negative.   Plan: -HIV antibody, RPR, oral/rectal/urine cytology -Descovy x3 months if HIV negative -F/u visit in 3-4 months   Craig Cole, PharmD PGY1 Pharmacy Resident 12/02/2020 11:53 AM

## 2020-12-03 LAB — CYTOLOGY, (ORAL, ANAL, URETHRAL) ANCILLARY ONLY
Chlamydia: NEGATIVE
Chlamydia: NEGATIVE
Comment: NEGATIVE
Comment: NEGATIVE
Comment: NORMAL
Comment: NORMAL
Neisseria Gonorrhea: NEGATIVE
Neisseria Gonorrhea: NEGATIVE

## 2020-12-03 LAB — URINE CYTOLOGY ANCILLARY ONLY
Chlamydia: NEGATIVE
Comment: NEGATIVE
Comment: NORMAL
Neisseria Gonorrhea: NEGATIVE

## 2020-12-03 LAB — HIV ANTIBODY (ROUTINE TESTING W REFLEX): HIV 1&2 Ab, 4th Generation: NONREACTIVE

## 2020-12-04 ENCOUNTER — Other Ambulatory Visit: Payer: Self-pay | Admitting: Pharmacist

## 2020-12-04 DIAGNOSIS — Z79899 Other long term (current) drug therapy: Secondary | ICD-10-CM

## 2020-12-04 MED ORDER — DESCOVY 200-25 MG PO TABS
1.0000 | ORAL_TABLET | Freq: Every day | ORAL | 0 refills | Status: DC
Start: 2020-12-04 — End: 2021-03-28

## 2020-12-06 ENCOUNTER — Encounter: Payer: Self-pay | Admitting: Emergency Medicine

## 2020-12-06 ENCOUNTER — Ambulatory Visit
Admission: EM | Admit: 2020-12-06 | Discharge: 2020-12-06 | Disposition: A | Discharge status: Home / Self Care | Payer: BC Managed Care – PPO | Attending: Emergency Medicine | Admitting: Emergency Medicine

## 2020-12-06 ENCOUNTER — Other Ambulatory Visit: Payer: Self-pay

## 2020-12-06 DIAGNOSIS — S20461A Insect bite (nonvenomous) of right back wall of thorax, initial encounter: Secondary | ICD-10-CM

## 2020-12-06 DIAGNOSIS — W57XXXA Bitten or stung by nonvenomous insect and other nonvenomous arthropods, initial encounter: Secondary | ICD-10-CM

## 2020-12-06 MED ORDER — DOXYCYCLINE HYCLATE 100 MG PO CAPS: 100.0000 mg | ORAL_CAPSULE | Freq: Two times a day (BID) | ORAL | 0 refills | Status: DC

## 2020-12-06 NOTE — Discharge Instructions (Addendum)
Doxycycline prescribed.  Take as directed and to completion To prevent tick bites, wear long sleeves, long pants, and light colors. Use insect repellent. Follow the instructions on the bottle. If the tick is biting, do not try to remove it with heat, alcohol, petroleum jelly, or fingernail polish. Use tweezers, curved forceps, or a tick-removal tool to grasp the tick. Gently pull up until the tick lets go. Do not twist or jerk the tick. Do not squeeze or crush the tick. Return here or go to ER if you have any new or worsening symptoms (rash, nausea, vomiting, fever, chills, headache, fatigue)

## 2020-12-06 NOTE — ED Triage Notes (Signed)
Recently diagnosed with [place] spotted fever about a month ago.  States he found another tick on his back about 3 weeks ago.  States he has been nauseated.

## 2020-12-06 NOTE — ED Provider Notes (Signed)
Craig Cole   [id number] 12/06/20 Arrival Time: [place]  CC: SKIN COMPLAINT  SUBJECTIVE:  Craig Cole is a 60 y.o. male complains of attached tick bite to RT upper back that occurred a couple of weeks ago.  Nausea associated.  States tick was not attached for greater than 24 hours.  Sister removed tick at home. Reports tick bite recently and dx'ed with RMSF.  Denies fever, chills, vomiting, headache, dizziness, weakness, fatigue, rash, or abdominal pain.    ROS: As per HPI.  All other pertinent ROS negative.     Past Medical History:  Diagnosis Date   Cancer ([place])    skin cancer   GERD (gastroesophageal reflux disease)    Hyperlipidemia    Hypertension    Sleep apnea    Past Surgical History:  Procedure Laterality Date   TONSILLECTOMY     No Known Allergies No current facility-administered medications on file prior to encounter.   Current Outpatient Medications on File Prior to Encounter  Medication Sig Dispense Refill   aspirin EC 81 MG tablet Take 81 mg by mouth daily.     diltiazem (CARDIZEM CD) 240 MG 24 hr capsule Take 240 mg by mouth daily.     dorzolamide-timolol (COSOPT) 22.3-6.8 MG/ML ophthalmic solution Place 1 drop into the left eye 2 (two) times daily.     emtricitabine-tenofovir AF (DESCOVY) 200-25 MG tablet Take 1 tablet by mouth daily. 90 tablet 0   esomeprazole (NEXIUM) 40 MG capsule Take 40 mg by mouth every morning.     lisinopril (ZESTRIL) 40 MG tablet Take 1 tablet by mouth daily.     montelukast (SINGULAIR) 10 MG tablet Take 10 mg by mouth at bedtime.     simvastatin (ZOCOR) 40 MG tablet Take 40 mg by mouth at bedtime.     tadalafil (CIALIS) 5 MG tablet Take 5 mg by mouth daily.     Tafluprost, PF, 0.0015 % SOLN Place 0.0015 % into both eyes at bedtime.     tamsulosin (FLOMAX) 0.4 MG CAPS capsule Take 0.4 mg by mouth at bedtime.     Social History   Socioeconomic History   Marital status: Single    Spouse name: Not on file   Number of  children: Not on file   Years of education: Not on file   Highest education level: Not on file  Occupational History   Not on file  Tobacco Use   Smoking status: Never   Smokeless tobacco: Never  Vaping Use   Vaping Use: Never used  Substance and Sexual Activity   Alcohol use: Never   Drug use: Never   Sexual activity: Not on file  Other Topics Concern   Not on file  Social History Narrative   Not on file   Social Determinants of Health   Financial Resource Strain: Not on file  Food Insecurity: Not on file  Transportation Needs: Not on file  Physical Activity: Not on file  Stress: Not on file  Social Connections: Not on file  Intimate Partner Violence: Not on file   Family History  Problem Relation Age of Onset   Hypertension Mother    Diabetes Mother    Myasthenia gravis Father     OBJECTIVE: Vitals:   12/06/20 1815  BP: (!) 159/96  Pulse: 67  Resp: 16  Temp: (!) 97.5 F (36.4 C)  TempSrc: Oral  SpO2: 97%    General appearance: alert; no distress Head: NCAT Lungs: normal respiratory effort Extremities: no edema  Skin: warm and dry; small pink papule to RT upper back, no obvious rash, redness, bleeding or drainage, NTTP Psychological: alert and cooperative; normal mood and affect  ASSESSMENT & PLAN:  1. Tick bite of right back wall of thorax, initial encounter     Meds ordered this encounter  Medications   doxycycline (VIBRAMYCIN) 100 MG capsule    Sig: Take 1 capsule (100 mg total) by mouth 2 (two) times daily.    Dispense:  20 capsule    Refill:  0    Order Specific Question:   Supervising Provider    Answer:   Raylene Everts [[id number]]    Doxycycline prescribed.  Take as directed and to completion To prevent tick bites, wear long sleeves, long pants, and light colors. Use insect repellent. Follow the instructions on the bottle. If the tick is biting, do not try to remove it with heat, alcohol, petroleum jelly, or fingernail polish. Use  tweezers, curved forceps, or a tick-removal tool to grasp the tick. Gently pull up until the tick lets go. Do not twist or jerk the tick. Do not squeeze or crush the tick. Return here or go to ER if you have any new or worsening symptoms (rash, nausea, vomiting, fever, chills, headache, fatigue)   Reviewed expectations re: course of current medical issues. Questions answered. Outlined signs and symptoms indicating need for more acute intervention. Patient verbalized understanding. After Visit Summary given.    Lestine Box, PA-C 12/06/20 1832

## 2020-12-10 ENCOUNTER — Ambulatory Visit: Payer: BC Managed Care – PPO | Admitting: Pharmacist

## 2020-12-23 DIAGNOSIS — G4733 Obstructive sleep apnea (adult) (pediatric): Secondary | ICD-10-CM | POA: Diagnosis not present

## 2020-12-30 ENCOUNTER — Ambulatory Visit
Admission: EM | Admit: 2020-12-30 | Discharge: 2020-12-30 | Disposition: A | Discharge status: Home / Self Care | Payer: BC Managed Care – PPO | Attending: Family Medicine | Admitting: Family Medicine

## 2020-12-30 DIAGNOSIS — U071 COVID-19: Secondary | ICD-10-CM

## 2020-12-30 MED ORDER — NIRMATRELVIR/RITONAVIR (PAXLOVID)TABLET: 3.0000 | ORAL_TABLET | Freq: Two times a day (BID) | ORAL | 0 refills | Status: AC

## 2020-12-30 MED ORDER — PROMETHAZINE-DM 6.25-15 MG/5ML PO SYRP: 5.0000 mL | ORAL_SOLUTION | Freq: Four times a day (QID) | ORAL | 0 refills | Status: DC | PRN

## 2020-12-30 NOTE — ED Triage Notes (Signed)
Pt presents with nasal congestion and cough  that began Friday  home covid test positive this morning

## 2020-12-30 NOTE — ED Provider Notes (Signed)
[facility]    CSN: [id number] Arrival date & time: 12/30/20  1039      History   Chief Complaint Chief Complaint  Patient presents with   Covid Positive    HPI Craig Cole is a 60 y.o. male.   HPI Patient tested positive for COVID-19 x 1 day ago. He developed fever, achines, and cough x 3 days ago. Exposed recently while traveling. Taking Vicks and cold and flu and  Zyrtec management of symptoms. Last had fever today. Taking tylenol for fever management. Past Medical History:  Diagnosis Date   Cancer [facility])    skin cancer   GERD (gastroesophageal reflux disease)    Hyperlipidemia    Hypertension    Sleep apnea     Patient Active Problem List   Diagnosis Date Noted   On pre-exposure prophylaxis for HIV 09/10/2020   High risk sexual behavior 09/10/2020    Past Surgical History:  Procedure Laterality Date   TONSILLECTOMY         Home Medications    Prior to Admission medications   Medication Sig Start Date End Date Taking? Authorizing Provider  aspirin EC 81 MG tablet Take 81 mg by mouth daily.    [provider]  diltiazem (CARDIZEM CD) 240 MG 24 hr capsule Take 240 mg by mouth daily. 05/31/19   [provider]  dorzolamide-timolol (COSOPT) 22.3-6.8 MG/ML ophthalmic solution Place 1 drop into the left eye 2 (two) times daily.    [provider]  doxycycline (VIBRAMYCIN) 100 MG capsule Take 1 capsule (100 mg total) by mouth 2 (two) times daily. 12/06/20   Wurst, [place], PA-C  emtricitabine-tenofovir AF (DESCOVY) 200-25 MG tablet Take 1 tablet by mouth daily. 12/04/20   Kuppelweiser, Cassie L, RPH-CPP  esomeprazole (NEXIUM) 40 MG capsule Take 40 mg by mouth every morning. 05/31/19   [provider]  lisinopril (ZESTRIL) 40 MG tablet Take 1 tablet by mouth daily. 06/27/20   [provider]  montelukast (SINGULAIR) 10 MG tablet Take 10 mg by mouth at bedtime. 05/31/19   [provider]  simvastatin  (ZOCOR) 40 MG tablet Take 40 mg by mouth at bedtime. 05/31/19   [provider]  tadalafil (CIALIS) 5 MG tablet Take 5 mg by mouth daily. 09/27/20   [provider]  Tafluprost, PF, 0.0015 % SOLN Place 0.0015 % into both eyes at bedtime.    [provider]  tamsulosin (FLOMAX) 0.4 MG CAPS capsule Take 0.4 mg by mouth at bedtime. 05/31/19   [provider]    Family History Family History  Problem Relation Age of Onset   Hypertension Mother    Diabetes Mother    Myasthenia gravis Father     Social History Social History   Tobacco Use   Smoking status: Never   Smokeless tobacco: Never  Vaping Use   Vaping Use: Never used  Substance Use Topics   Alcohol use: Never   Drug use: Never     Allergies   Patient has no known allergies.   Review of Systems Review of Systems Pertinent negatives listed in HPI   Physical Exam Triage Vital Signs ED Triage Vitals  Enc Vitals Group     BP 12/30/20 1135 138/78     Pulse Rate 12/30/20 1135 100     Resp 12/30/20 1135 20     Temp 12/30/20 1135 98.6 F (37 C)     Temp src --      SpO2 12/30/20 1135  94 %     Weight --      Height --      Head Circumference --      Peak Flow --      Pain Score 12/30/20 1136 0     Pain Loc --      Pain Edu? --      Excl. in [place]? --    No data found.  Updated Vital Signs BP 138/78   Pulse 100   Temp 98.6 F (37 C)   Resp 20   SpO2 94%   Visual Acuity Right Eye Distance:   Left Eye Distance:   Bilateral Distance:    Right Eye Near:   Left Eye Near:    Bilateral Near:     Physical Exam  General Appearance:    Alert, acutely ill appearing, obese, cooperative, no  distress  HENT:   Normocephalic, ears normal, nares mucosal edema with congestion, rhinorrhea, oropharynx patent   Eyes:    PERRL, conjunctiva/corneas clear, EOM's intact       Lungs:     Clear to auscultation bilaterally, persistent cough, respirations unlabored  Heart:    Regular rate and  rhythm  Neurologic:   Awake, alert, oriented x 3. No apparent focal neurological defect.      UC Treatments / Results  Labs (all labs ordered are listed, but only abnormal results are displayed) Labs Reviewed - No data to display  EKG   Radiology No results found.  Procedures Procedures (including critical care time)  Medications Ordered in UC Medications - No data to display  Initial Impression / Assessment and Plan / UC Course  I have reviewed the triage vital signs and the nursing notes.  Pertinent labs & imaging results that were available during my care of the patient were reviewed by me and considered in my medical decision making (see chart for details).    COVID-positive home test x1 day ago symptoms present for 3 days.  Patient has high risk conditions including obesity, hyperlipidemia, hypertension and obstructive sleep apnea.  Patient meets the criteria for treatment with antiviral treatment with Paxlovid.  Recent GFR greater than 60.  Promethazine DM for cough.  Continue Tylenol as needed for fever management.  ER precautions if symptoms worsen or any breathing difficulty develops. Final Clinical Impressions(s) / UC Diagnoses   Final diagnoses:  COVID-19 virus infection   Discharge Instructions   None    ED Prescriptions     Medication Sig Dispense Auth. Provider   nirmatrelvir/ritonavir EUA (PAXLOVID) TABS Take 3 tablets by mouth 2 (two) times daily for 5 days. Patient GFR is 105. Take nirmatrelvir (150 mg) two tablets twice daily for 5 days and ritonavir (100 mg) one tablet twice daily for 5 days. 30 tablet Scot Jun, FNP   promethazine-dextromethorphan (PROMETHAZINE-DM) 6.25-15 MG/5ML syrup Take 5 mLs by mouth 4 (four) times daily as needed for cough. 140 mL Scot Jun, FNP      PDMP not reviewed this encounter.   Scot Jun, FNP 12/30/20 1240

## 2021-01-23 DIAGNOSIS — G4733 Obstructive sleep apnea (adult) (pediatric): Secondary | ICD-10-CM | POA: Diagnosis not present

## 2021-02-13 DIAGNOSIS — Z23 Encounter for immunization: Secondary | ICD-10-CM | POA: Diagnosis not present

## 2021-02-13 DIAGNOSIS — H401131 Primary open-angle glaucoma, bilateral, mild stage: Secondary | ICD-10-CM | POA: Diagnosis not present

## 2021-02-17 ENCOUNTER — Other Ambulatory Visit: Payer: Self-pay | Admitting: Pharmacist

## 2021-02-17 DIAGNOSIS — Z79899 Other long term (current) drug therapy: Secondary | ICD-10-CM

## 2021-02-21 ENCOUNTER — Encounter (HOSPITAL_COMMUNITY): Payer: Self-pay

## 2021-02-21 ENCOUNTER — Emergency Department (HOSPITAL_COMMUNITY)
Admission: EM | Admit: 2021-02-21 | Discharge: 2021-02-21 | Disposition: A | Discharge status: Home / Self Care | Payer: BC Managed Care – PPO | Attending: Emergency Medicine | Admitting: Emergency Medicine

## 2021-02-21 ENCOUNTER — Other Ambulatory Visit: Payer: Self-pay

## 2021-02-21 ENCOUNTER — Emergency Department (HOSPITAL_COMMUNITY): Payer: BC Managed Care – PPO

## 2021-02-21 DIAGNOSIS — M1611 Unilateral primary osteoarthritis, right hip: Secondary | ICD-10-CM | POA: Diagnosis not present

## 2021-02-21 DIAGNOSIS — M5432 Sciatica, left side: Secondary | ICD-10-CM | POA: Diagnosis not present

## 2021-02-21 DIAGNOSIS — M5442 Lumbago with sciatica, left side: Secondary | ICD-10-CM | POA: Diagnosis not present

## 2021-02-21 DIAGNOSIS — Z85828 Personal history of other malignant neoplasm of skin: Secondary | ICD-10-CM | POA: Insufficient documentation

## 2021-02-21 DIAGNOSIS — M25552 Pain in left hip: Secondary | ICD-10-CM | POA: Diagnosis not present

## 2021-02-21 DIAGNOSIS — Z7982 Long term (current) use of aspirin: Secondary | ICD-10-CM | POA: Insufficient documentation

## 2021-02-21 DIAGNOSIS — M1612 Unilateral primary osteoarthritis, left hip: Secondary | ICD-10-CM | POA: Diagnosis not present

## 2021-02-21 DIAGNOSIS — M1990 Unspecified osteoarthritis, unspecified site: Secondary | ICD-10-CM | POA: Diagnosis not present

## 2021-02-21 DIAGNOSIS — I10 Essential (primary) hypertension: Secondary | ICD-10-CM | POA: Diagnosis not present

## 2021-02-21 DIAGNOSIS — Z79899 Other long term (current) drug therapy: Secondary | ICD-10-CM | POA: Diagnosis not present

## 2021-02-21 MED ORDER — PREDNISONE 10 MG PO TABS: ORAL_TABLET | ORAL | 0 refills | Status: DC

## 2021-02-21 MED ORDER — METHOCARBAMOL 500 MG PO TABS: 500.0000 mg | ORAL_TABLET | Freq: Three times a day (TID) | ORAL | 0 refills | Status: DC

## 2021-02-21 MED ORDER — HYDROCODONE-ACETAMINOPHEN 5-325 MG PO TABS: ORAL_TABLET | ORAL | 0 refills | Status: DC

## 2021-02-21 NOTE — ED Provider Notes (Signed)
[facility] EMERGENCY DEPARTMENT Provider Note   CSN: [id number] Arrival date & time: 02/21/21  1629     History Chief Complaint  Patient presents with   Hip Pain    Craig Cole is a 60 y.o. male.   Hip Pain Pertinent negatives include no chest pain, no abdominal pain and no shortness of breath.       Craig Cole is a 60 y.o. male with past medical history of hyperlipidemia and hypertension who presents to the Emergency Department complaining of left hip and low back pain x2 weeks.  He describes a sharp burning pain to the left upper buttock area that radiates into his hip and down the posterior and lateral aspect of the left lower leg.  Pain intermittently radiates to the level of his left foot.  He reports history of "pinched nerve" and states current symptoms feel similar.  Pain improves with sitting and worsens with standing or walking.  He denies fall, abdominal pain, urine or bowel changes, numbness or weakness of his lower extremities.  No recent spinal procedures, fever or chills.    Past Medical History:  Diagnosis Date   Cancer ([place])    skin cancer   GERD (gastroesophageal reflux disease)    Hyperlipidemia    Hypertension    Sleep apnea     Patient Active Problem List   Diagnosis Date Noted   On pre-exposure prophylaxis for HIV 09/10/2020   High risk sexual behavior 09/10/2020    Past Surgical History:  Procedure Laterality Date   TONSILLECTOMY         Family History  Problem Relation Age of Onset   Hypertension Mother    Diabetes Mother    Myasthenia gravis Father     Social History   Tobacco Use   Smoking status: Never   Smokeless tobacco: Never  Vaping Use   Vaping Use: Never used  Substance Use Topics   Alcohol use: Never   Drug use: Never    Home Medications Prior to Admission medications   Medication Sig Start Date End Date Taking? Authorizing Provider  HYDROcodone-acetaminophen (NORCO/VICODIN) 5-325 MG tablet Take one tab po  q 4 hrs prn pain 02/21/21  Yes Luisana Lutzke, PA-C  methocarbamol (ROBAXIN) 500 MG tablet Take 1 tablet (500 mg total) by mouth 3 (three) times daily. 02/21/21  Yes Aideen Fenster, PA-C  predniSONE (DELTASONE) 10 MG tablet Take 6 tablets day one, 5 tablets day two, 4 tablets day three, 3 tablets day four, 2 tablets day five, then 1 tablet day six 02/21/21  Yes Kiaraliz Rafuse, PA-C  aspirin EC 81 MG tablet Take 81 mg by mouth daily.    [provider]  diltiazem (CARDIZEM CD) 240 MG 24 hr capsule Take 240 mg by mouth daily. 05/31/19   [provider]  dorzolamide-timolol (COSOPT) 22.3-6.8 MG/ML ophthalmic solution Place 1 drop into the left eye 2 (two) times daily.    [provider]  doxycycline (VIBRAMYCIN) 100 MG capsule Take 1 capsule (100 mg total) by mouth 2 (two) times daily. 12/06/20   Wurst, [place], PA-C  emtricitabine-tenofovir AF (DESCOVY) 200-25 MG tablet Take 1 tablet by mouth daily. 12/04/20   Kuppelweiser, Cassie L, RPH-CPP  esomeprazole (NEXIUM) 40 MG capsule Take 40 mg by mouth every morning. 05/31/19   [provider]  lisinopril (ZESTRIL) 40 MG tablet Take 1 tablet by mouth daily. 06/27/20   [provider]  montelukast (SINGULAIR) 10 MG tablet Take 10 mg by mouth at bedtime.  05/31/19   [provider]  promethazine-dextromethorphan (PROMETHAZINE-DM) 6.25-15 MG/5ML syrup Take 5 mLs by mouth 4 (four) times daily as needed for cough. 12/30/20   Scot Jun, FNP  simvastatin (ZOCOR) 40 MG tablet Take 40 mg by mouth at bedtime. 05/31/19   [provider]  tadalafil (CIALIS) 5 MG tablet Take 5 mg by mouth daily. 09/27/20   [provider]  Tafluprost, PF, 0.0015 % SOLN Place 0.0015 % into both eyes at bedtime.    [provider]  tamsulosin (FLOMAX) 0.4 MG CAPS capsule Take 0.4 mg by mouth at bedtime. 05/31/19   [provider]    Allergies    Patient has no known allergies.  Review of Systems    Review of Systems  Constitutional:  Negative for chills, fatigue and fever.  Respiratory:  Negative for shortness of breath.   Cardiovascular:  Negative for chest pain and leg swelling.  Gastrointestinal:  Negative for abdominal pain, nausea and vomiting.  Genitourinary:  Negative for dysuria, flank pain, penile pain and testicular pain.  Musculoskeletal:  Positive for arthralgias (left hip pain) and back pain. Negative for myalgias, neck pain and neck stiffness.  Skin:  Negative for color change and rash.  Neurological:  Negative for dizziness, weakness and numbness.  Hematological:  Does not bruise/bleed easily.   Physical Exam Updated Vital Signs BP (!) 142/84 (BP Location: Right Arm)   Pulse 64   Temp 97.9 F (36.6 C) (Temporal)   Resp 18   Ht '6\' 5"'$  (1.956 m)   Wt (!) 152 kg   SpO2 93%   BMI 39.73 kg/m   Physical Exam Vitals and nursing note reviewed.  Constitutional:      Appearance: Normal appearance. He is obese. He is not ill-appearing or toxic-appearing.  HENT:     Head: Normocephalic and atraumatic.  Neck:     Thyroid: No thyromegaly.     Meningeal: Kernig's sign absent.  Cardiovascular:     Rate and Rhythm: Normal rate and regular rhythm.     Pulses: Normal pulses.  Pulmonary:     Effort: Pulmonary effort is normal.     Breath sounds: Normal breath sounds. No wheezing.  Abdominal:     Palpations: Abdomen is soft.     Tenderness: There is no abdominal tenderness. There is no right CVA tenderness, left CVA tenderness, guarding or rebound.  Musculoskeletal:        General: Tenderness present. Normal range of motion.     Cervical back: Normal range of motion and neck supple.     Right lower leg: No edema.     Left lower leg: No edema.     Comments: Tender to palpation of the left SI joint space.  No midline spinal tenderness.  Pain with internal rotation of the left hip.  Hip flexors and extensors are intact.  Skin:    General: Skin is warm.     Findings: No  erythema or rash.  Neurological:     General: No focal deficit present.     Mental Status: He is alert.     GCS: GCS eye subscore is 4. GCS verbal subscore is 5. GCS motor subscore is 6.     Sensory: Sensation is intact.     Motor: Motor function is intact. No weakness.     Coordination: Coordination is intact.    ED Results / Procedures / Treatments   Labs (all labs ordered are listed, but only abnormal results are displayed)  Labs Reviewed - No data to display  EKG None  Radiology DG Hip Unilat W or Wo Pelvis 2-3 Views Left  Result Date: 02/21/2021 CLINICAL DATA:  Left hip pain EXAM: DG HIP (WITH OR WITHOUT PELVIS) 2-3V LEFT COMPARISON:  None. FINDINGS: There is no evidence of acute hip fracture. There is moderate left and right hip osteoarthritis. Lower lumbar spine degenerative changes. Bilateral SI joint degenerative changes. IMPRESSION: No evidence of left hip fracture.  Moderate left hip osteoarthritis. Electronically Signed   By: Maurine Simmering M.D.   On: 02/21/2021 17:20    Procedures Procedures   Medications Ordered in ED Medications - No data to display  ED Course  I have reviewed the triage vital signs and the nursing notes.  Pertinent labs & imaging results that were available during my care of the patient were reviewed by me and considered in my medical decision making (see chart for details).    MDM Rules/Calculators/A&P                           Patient here with complaint of left hip pain x2 weeks.  No known injury.  History of "pinched nerve."  States current symptoms feel similar.  He is ambulatory with steady gait.  No focal neurodeficits.  X-ray of the hip shows moderate left hip OA.  I feel symptoms are likely related to sciatica.  No concerning symptoms for cauda equina or spinal abscess. I feel patient is appropriate for discharge home, will treat with steroid taper, muscle relaxer and short course of pain medication.  He does have a PCP and agrees to close  outpatient follow-up.  Return precautions were discussed.   Final Clinical Impression(s) / ED Diagnoses Final diagnoses:  Sciatica of left side  Osteoarthritis of left hip, unspecified osteoarthritis type    Rx / DC Orders ED Discharge Orders          Ordered    predniSONE (DELTASONE) 10 MG tablet        02/21/21 1804    methocarbamol (ROBAXIN) 500 MG tablet  3 times daily        02/21/21 1804    HYDROcodone-acetaminophen (NORCO/VICODIN) 5-325 MG tablet        02/21/21 1804             Kem Parkinson, PA-C 02/21/21 1809    Long, Wonda [facility], MD 02/25/21 [phone]

## 2021-02-21 NOTE — Discharge Instructions (Addendum)
Your symptoms are likely related to sciatica.  Your x-ray of the left hip also shows you have some osteoarthritis of the hip joint.  I recommend that you alternate ice and heat to your lower back.  Take the medication as directed.  Please follow-up with your primary care provider next week if your symptoms are not improving.  Return to emergency department for any new or worsening symptoms.

## 2021-02-21 NOTE — ED Triage Notes (Signed)
Pt presents to ED with complaints of left hip pain x 2 weeks. Pt states pain is from hip down to foot making it hard to walk

## 2021-02-23 DIAGNOSIS — G4733 Obstructive sleep apnea (adult) (pediatric): Secondary | ICD-10-CM | POA: Diagnosis not present

## 2021-02-27 ENCOUNTER — Other Ambulatory Visit (HOSPITAL_COMMUNITY): Payer: Self-pay | Admitting: Gerontology

## 2021-02-27 ENCOUNTER — Other Ambulatory Visit: Payer: Self-pay

## 2021-02-27 ENCOUNTER — Ambulatory Visit (INDEPENDENT_AMBULATORY_CARE_PROVIDER_SITE_OTHER): Payer: BC Managed Care – PPO | Admitting: Physician Assistant

## 2021-02-27 ENCOUNTER — Encounter: Payer: Self-pay | Admitting: Physician Assistant

## 2021-02-27 ENCOUNTER — Ambulatory Visit: Payer: Self-pay

## 2021-02-27 VITALS — Ht 76.5 in | Wt 336.0 lb

## 2021-02-27 DIAGNOSIS — M79652 Pain in left thigh: Secondary | ICD-10-CM | POA: Diagnosis not present

## 2021-02-27 DIAGNOSIS — M25552 Pain in left hip: Secondary | ICD-10-CM | POA: Diagnosis not present

## 2021-02-27 DIAGNOSIS — M545 Low back pain, unspecified: Secondary | ICD-10-CM | POA: Diagnosis not present

## 2021-02-27 DIAGNOSIS — I10 Essential (primary) hypertension: Secondary | ICD-10-CM | POA: Diagnosis not present

## 2021-02-27 DIAGNOSIS — M79662 Pain in left lower leg: Secondary | ICD-10-CM

## 2021-02-27 NOTE — Progress Notes (Signed)
Office Visit Note   Patient: Craig Cole           Date of Birth: 09-Oct-1960           MRN: [id number] Visit Date: 02/27/2021              Requested by: Rosita Fire, MD [address] [place],  [place] [address] PCP: Rosita Fire, MD   Assessment & Plan: Visit Diagnoses:  1. Acute left-sided low back pain, unspecified whether sciatica present     Plan: Given the patient's failure of conservative treatment which is included medications, time, and chiropractic treatment without any relief recommend MRI of the lumbar spine rule out HNP as the source of his radicular symptoms down the left leg.  We will have him follow-up after the MRI to go over results discuss further treatment.  Questions encouraged and answered at length today.  Follow-Up Instructions: Return After MRI.   Orders:  Orders Placed This Encounter  Procedures   XR Lumbar Spine 2-3 Views   MR Lumbar Spine w/o contrast   No orders of the defined types were placed in this encounter.     Procedures: No procedures performed   Clinical Data: No additional findings.   Subjective: Chief Complaint  Patient presents with   Left Leg - Pain   Lower Back - Pain    HPI Mr. Kornfeld is a pleasant 60 year old male were seen for the first time for low back pain.  He has had pain radiating down his left leg for now 3 weeks.  He is on the chiropractor 3 different times without any real relief.  He was seen in the ER and was given prednisone hydrocodone and Robaxin without any relief.  He has a history of sciatica but nothing this severe.  He describes pain down the lateral aspect left leg that crosses over mid tibia and causes numbness medial aspect of left foot.  No symptoms down the right leg.  He denies any bowel or bladder dysfunction, no saddle anesthesia like symptoms.  No waking pain.  Denies any fevers or chills.  Review of Systems See HPI  Objective: Vital Signs: Ht 6' 4.5" (1.943 m)   Wt (!) 336 lb  (152.4 kg)   BMI 40.37 kg/m   Physical Exam Constitutional:      Appearance: He is not ill-appearing or diaphoretic.  Cardiovascular:     Pulses: Normal pulses.  Pulmonary:     Effort: Pulmonary effort is normal.  Neurological:     Mental Status: He is alert and oriented to person, place, and time.  Psychiatric:        Mood and Affect: Mood normal.    Ortho Exam Bilateral hips good range of motion of both hips without significant pain.  He has slight tenderness over the left trochanteric region.  Straight leg raise is negative bilaterally tight hamstrings bilaterally.  5 out of 5 strength throughout the lower extremities against resistance.  Subjective normal sensation to light touch throughout both feet.  Specialty Comments:  No specialty comments available.  Imaging: XR Lumbar Spine 2-3 Views  Result Date: 02/27/2021 Lumbar spine: No acute fractures.  Significant scoliosis involving mid lumbar spine.  Loss of the lordotic curvature.  Overall disc base well-maintained.  No spondylolisthesis.    PMFS History: Patient Active Problem List   Diagnosis Date Noted   On pre-exposure prophylaxis for HIV 09/10/2020   High risk sexual behavior 09/10/2020   Past Medical History:  Diagnosis Date  Cancer ([place])    skin cancer   GERD (gastroesophageal reflux disease)    Hyperlipidemia    Hypertension    Sleep apnea     Family History  Problem Relation Age of Onset   Hypertension Mother    Diabetes Mother    Myasthenia gravis Father     Past Surgical History:  Procedure Laterality Date   TONSILLECTOMY     Social History   Occupational History   Not on file  Tobacco Use   Smoking status: Never   Smokeless tobacco: Never  Vaping Use   Vaping Use: Never used  Substance and Sexual Activity   Alcohol use: Never   Drug use: Never   Sexual activity: Not on file

## 2021-02-28 ENCOUNTER — Other Ambulatory Visit: Payer: Self-pay | Admitting: Gerontology

## 2021-02-28 ENCOUNTER — Other Ambulatory Visit (HOSPITAL_COMMUNITY): Payer: Self-pay | Admitting: Gerontology

## 2021-02-28 ENCOUNTER — Ambulatory Visit (HOSPITAL_COMMUNITY)
Admission: RE | Admit: 2021-02-28 | Discharge: 2021-02-28 | Disposition: A | Discharge status: Home / Self Care | Payer: BC Managed Care – PPO | Source: Ambulatory Visit | Attending: Gerontology | Admitting: Gerontology

## 2021-02-28 DIAGNOSIS — R600 Localized edema: Secondary | ICD-10-CM | POA: Diagnosis not present

## 2021-02-28 DIAGNOSIS — M79604 Pain in right leg: Secondary | ICD-10-CM | POA: Insufficient documentation

## 2021-02-28 DIAGNOSIS — M79605 Pain in left leg: Secondary | ICD-10-CM | POA: Diagnosis not present

## 2021-03-06 ENCOUNTER — Other Ambulatory Visit: Payer: Self-pay | Admitting: Pharmacist

## 2021-03-06 DIAGNOSIS — Z79899 Other long term (current) drug therapy: Secondary | ICD-10-CM

## 2021-03-10 NOTE — Telephone Encounter (Signed)
This was sent to [facility] imaging

## 2021-03-11 ENCOUNTER — Ambulatory Visit (HOSPITAL_COMMUNITY): Payer: BC Managed Care – PPO

## 2021-03-12 DIAGNOSIS — M47816 Spondylosis without myelopathy or radiculopathy, lumbar region: Secondary | ICD-10-CM | POA: Diagnosis not present

## 2021-03-12 DIAGNOSIS — M5137 Other intervertebral disc degeneration, lumbosacral region: Secondary | ICD-10-CM | POA: Diagnosis not present

## 2021-03-12 DIAGNOSIS — M5126 Other intervertebral disc displacement, lumbar region: Secondary | ICD-10-CM | POA: Diagnosis not present

## 2021-03-12 DIAGNOSIS — M47817 Spondylosis without myelopathy or radiculopathy, lumbosacral region: Secondary | ICD-10-CM | POA: Diagnosis not present

## 2021-03-14 ENCOUNTER — Other Ambulatory Visit: Payer: Self-pay

## 2021-03-14 ENCOUNTER — Ambulatory Visit (HOSPITAL_COMMUNITY): Payer: BC Managed Care – PPO

## 2021-03-14 DIAGNOSIS — M545 Low back pain, unspecified: Secondary | ICD-10-CM

## 2021-03-18 ENCOUNTER — Telehealth: Payer: Self-pay | Admitting: Physician Assistant

## 2021-03-18 DIAGNOSIS — M545 Low back pain, unspecified: Secondary | ICD-10-CM

## 2021-03-18 NOTE — Telephone Encounter (Signed)
Pt called asking about getting an injection for his sciatica; and would like a CB to discuss getting scheduled for this asap.   [phone]

## 2021-03-18 NOTE — Telephone Encounter (Signed)
Please advise. MRI report in care everywhere. Done @ [facility]

## 2021-03-18 NOTE — Telephone Encounter (Signed)
Referral in chart. How far out is Dr. Ernestina Patches booked out?

## 2021-03-24 ENCOUNTER — Telehealth: Payer: Self-pay | Admitting: Physical Medicine and Rehabilitation

## 2021-03-24 ENCOUNTER — Ambulatory Visit: Payer: BC Managed Care – PPO | Admitting: Physician Assistant

## 2021-03-24 NOTE — Telephone Encounter (Signed)
Please advise 

## 2021-03-24 NOTE — Telephone Encounter (Signed)
Patient called. He is having a Monkey pox vaccine. Would like to know if that would interfere with injection with Dr Ernestina Patches?

## 2021-03-24 NOTE — Telephone Encounter (Signed)
Called patient to advise  °

## 2021-03-25 ENCOUNTER — Telehealth: Payer: Self-pay | Admitting: Physical Medicine and Rehabilitation

## 2021-03-25 ENCOUNTER — Other Ambulatory Visit: Payer: Self-pay

## 2021-03-25 ENCOUNTER — Encounter (HOSPITAL_COMMUNITY): Payer: Self-pay | Admitting: *Deleted

## 2021-03-25 ENCOUNTER — Emergency Department (HOSPITAL_COMMUNITY)
Admission: EM | Admit: 2021-03-25 | Discharge: 2021-03-25 | Disposition: A | Discharge status: Home / Self Care | Payer: BC Managed Care – PPO | Attending: Emergency Medicine | Admitting: Emergency Medicine

## 2021-03-25 DIAGNOSIS — I10 Essential (primary) hypertension: Secondary | ICD-10-CM | POA: Insufficient documentation

## 2021-03-25 DIAGNOSIS — Z79899 Other long term (current) drug therapy: Secondary | ICD-10-CM | POA: Insufficient documentation

## 2021-03-25 DIAGNOSIS — E669 Obesity, unspecified: Secondary | ICD-10-CM | POA: Insufficient documentation

## 2021-03-25 DIAGNOSIS — Z85828 Personal history of other malignant neoplasm of skin: Secondary | ICD-10-CM | POA: Insufficient documentation

## 2021-03-25 DIAGNOSIS — M5442 Lumbago with sciatica, left side: Secondary | ICD-10-CM | POA: Insufficient documentation

## 2021-03-25 DIAGNOSIS — M5432 Sciatica, left side: Secondary | ICD-10-CM

## 2021-03-25 DIAGNOSIS — Z7982 Long term (current) use of aspirin: Secondary | ICD-10-CM | POA: Diagnosis not present

## 2021-03-25 DIAGNOSIS — M545 Low back pain, unspecified: Secondary | ICD-10-CM | POA: Diagnosis not present

## 2021-03-25 DIAGNOSIS — G4733 Obstructive sleep apnea (adult) (pediatric): Secondary | ICD-10-CM | POA: Diagnosis not present

## 2021-03-25 MED ORDER — METHYLPREDNISOLONE 4 MG PO TBPK: ORAL_TABLET | ORAL | 0 refills | Status: DC

## 2021-03-25 MED ORDER — KETOROLAC TROMETHAMINE 30 MG/ML IJ SOLN
30.0000 mg | Freq: Once | INTRAMUSCULAR | Status: AC
  Administered 2021-03-25: 30 mg via INTRAMUSCULAR
  Filled 2021-03-25: qty 1

## 2021-03-25 MED ORDER — OXYCODONE-ACETAMINOPHEN 5-325 MG PO TABS: 1.0000 | ORAL_TABLET | Freq: Four times a day (QID) | ORAL | 0 refills | Status: DC | PRN

## 2021-03-25 MED ORDER — HYDROMORPHONE HCL 1 MG/ML IJ SOLN
1.0000 mg | Freq: Once | INTRAMUSCULAR | Status: AC
  Administered 2021-03-25: 1 mg via INTRAMUSCULAR
  Filled 2021-03-25: qty 1

## 2021-03-25 NOTE — ED Notes (Signed)
ED Provider at bedside. 

## 2021-03-25 NOTE — ED Triage Notes (Signed)
Pt states hx of sciatica, pain in the left hip that goes down his left leg. Pain is worse since yesterday in the lower leg area.

## 2021-03-25 NOTE — ED Provider Notes (Signed)
[facility] EMERGENCY DEPARTMENT Provider Note   CSN: [id number] Arrival date & time: 03/25/21  9381     History Chief Complaint  Patient presents with   Leg Pain    Craig Cole is a 60 y.o. male.  HPI     This is a 60 year old male with a history of hypertension and hyperlipidemia who presents with left leg pain.  Recent history of sciatica over the last 6 weeks.  He describes pain that radiates from his left lower back into his left leg.  He states normally it is worse when he is up and ambulating.  It is better when he is sitting down.  However over the last 24 hours she has had worsening pain and has not been able to get comfortable.  He has not really taken anything for pain because "nothing really works."  He reports that he has tried steroids, ibuprofen, Tylenol, Robaxin with minimal relief.  He has had x-rays, and ultrasound, and an MRI.  He states he is due for an epidural injection next week.  He currently rates his pain at 8 out of 10.  He states that it has been mostly constant over the last day.  He also reports some numbness over the lateral aspect of the calf and into his foot.  No difficulty with his bowel or bladder.  The numbness is not new.  Past Medical History:  Diagnosis Date   Cancer ([place])    skin cancer   GERD (gastroesophageal reflux disease)    Hyperlipidemia    Hypertension    Sleep apnea     Patient Active Problem List   Diagnosis Date Noted   On pre-exposure prophylaxis for HIV 09/10/2020   High risk sexual behavior 09/10/2020    Past Surgical History:  Procedure Laterality Date   TONSILLECTOMY         Family History  Problem Relation Age of Onset   Hypertension Mother    Diabetes Mother    Myasthenia gravis Father     Social History   Tobacco Use   Smoking status: Never   Smokeless tobacco: Never  Vaping Use   Vaping Use: Never used  Substance Use Topics   Alcohol use: Never   Drug use: Never    Home Medications Prior to  Admission medications   Medication Sig Start Date End Date Taking? Authorizing Provider  aspirin EC 81 MG tablet Take 81 mg by mouth daily.   Yes [provider]  diltiazem (CARDIZEM CD) 240 MG 24 hr capsule Take 240 mg by mouth daily. 05/31/19  Yes [provider]  dorzolamide-timolol (COSOPT) 22.3-6.8 MG/ML ophthalmic solution Place 1 drop into the left eye 2 (two) times daily.   Yes [provider]  emtricitabine-tenofovir AF (DESCOVY) 200-25 MG tablet Take 1 tablet by mouth daily. 12/04/20  Yes Kuppelweiser, Cassie L, RPH-CPP  esomeprazole (NEXIUM) 40 MG capsule Take 40 mg by mouth every morning. 05/31/19  Yes [provider]  hydrALAZINE (APRESOLINE) 25 MG tablet Take 25 mg by mouth 2 (two) times daily. 01/27/21  Yes [provider]  lisinopril (ZESTRIL) 40 MG tablet Take 1 tablet by mouth daily. 06/27/20  Yes [provider]  methylPREDNISolone (MEDROL DOSEPAK) 4 MG TBPK tablet Take as directed on packet 03/25/21  Yes Nylah Butkus, Barbette Hair, MD  montelukast (SINGULAIR) 10 MG tablet Take 10 mg by mouth at bedtime. 05/31/19  Yes [provider]  oxyCODONE-acetaminophen (PERCOCET/ROXICET) 5-325 MG tablet Take 1 tablet by mouth every  6 (six) hours as needed for severe pain. 03/25/21  Yes Kasandra Fehr, Barbette Hair, MD  simvastatin (ZOCOR) 40 MG tablet Take 40 mg by mouth at bedtime. 05/31/19  Yes [provider]  tadalafil (CIALIS) 5 MG tablet Take 5 mg by mouth daily. 09/27/20  Yes [provider]  Tafluprost, PF, 0.0015 % SOLN Place 0.0015 % into both eyes at bedtime.   Yes [provider]  tamsulosin (FLOMAX) 0.4 MG CAPS capsule Take 0.4 mg by mouth at bedtime. 05/31/19  Yes [provider]  doxycycline (VIBRAMYCIN) 100 MG capsule Take 1 capsule (100 mg total) by mouth 2 (two) times daily. 12/06/20   Wurst, [place], PA-C  HYDROcodone-acetaminophen (NORCO/VICODIN) 5-325 MG tablet Take one tab po q 4 hrs prn pain 02/21/21    Triplett, Tammy, PA-C  methocarbamol (ROBAXIN) 500 MG tablet Take 1 tablet (500 mg total) by mouth 3 (three) times daily. 02/21/21   Triplett, Tammy, PA-C  promethazine-dextromethorphan (PROMETHAZINE-DM) 6.25-15 MG/5ML syrup Take 5 mLs by mouth 4 (four) times daily as needed for cough. 12/30/20   Scot Jun, FNP    Allergies    Patient has no known allergies.  Review of Systems   Review of Systems  Constitutional:  Negative for fever.  Musculoskeletal:  Positive for back pain.  Neurological:  Positive for numbness. Negative for weakness.  All other systems reviewed and are negative.  Physical Exam Updated Vital Signs BP 136/81   Pulse 65   Temp 97.7 F (36.5 C) (Oral)   Resp 17   Ht 1.956 m (6\' 5" )   Wt (!) 149.7 kg   SpO2 98%   BMI 39.13 kg/m   Physical Exam Vitals and nursing note reviewed.  Constitutional:      Appearance: He is well-developed. He is obese. He is not ill-appearing.  HENT:     Head: Normocephalic and atraumatic.     Nose: Nose normal.     Mouth/Throat:     Mouth: Mucous membranes are moist.  Eyes:     Pupils: Pupils are equal, round, and reactive to light.  Cardiovascular:     Rate and Rhythm: Normal rate and regular rhythm.     Heart sounds: Normal heart sounds.  Pulmonary:     Effort: Pulmonary effort is normal. No respiratory distress.     Breath sounds: Normal breath sounds. No wheezing.  Abdominal:     Palpations: Abdomen is soft.     Tenderness: There is no abdominal tenderness.  Musculoskeletal:     Cervical back: Neck supple.     Comments: Positive left straight leg raise  Lymphadenopathy:     Cervical: No cervical adenopathy.  Skin:    General: Skin is warm and dry.  Neurological:     Mental Status: He is alert and oriented to person, place, and time.     Comments: 5 out of 5 strength bilateral lower extremities  Psychiatric:        Mood and Affect: Mood normal.    ED Results / Procedures / Treatments   Labs (all labs  ordered are listed, but only abnormal results are displayed) Labs Reviewed - No data to display  EKG None  Radiology No results found.  Procedures Procedures   Medications Ordered in ED Medications  ketorolac (TORADOL) 30 MG/ML injection 30 mg (30 mg Intramuscular Given 03/25/21 0550)  HYDROmorphone (DILAUDID) injection 1 mg (1 mg Intramuscular Given 03/25/21 0551)    ED Course  I have reviewed the triage vital signs  and the nursing notes.  Pertinent labs & imaging results that were available during my care of the patient were reviewed by me and considered in my medical decision making (see chart for details).  Clinical Course as of 03/25/21 0640  Tue Mar 25, 2021  0640 On recheck, patient states that he feels somewhat better.  Pain is now 5 out of 10.  He reports that the numbness in his leg is improved. [CH]    Clinical Course User Index [CH] Maliah Pyles, Barbette Hair, MD   MDM Rules/Calculators/A&P                           Patient presents with left-sided back and leg pain.  He is overall nontoxic and vital signs are reassuring.  He reports persistent worsening pain and now some sensory changes on the lateral aspect of the leg.  He is nontoxic.  Previously had imaging and work-up consistent with sciatica.  He is due for an epidural injection next week.  He has no signs or symptoms of cauda equina.  No red flags.  He neurologically is intact objectively.  I discussed with the patient that he needs to schedule anti-inflammatories in order to give him the best opportunity to work.  It sounds as though he is not taking medications consistently.  He was given IM Toradol and Dilaudid.  See clinical course above.  We will send home with a Medrol Dosepak and a short course of Percocet for breakthrough pain.  Narcotic database was reviewed.  He has only had 1 previous prescription in September as provided in the history.  After history, exam, and medical workup I feel the patient has been  appropriately medically screened and is safe for discharge home. Pertinent diagnoses were discussed with the patient. Patient was given return precautions.  Final Clinical Impression(s) / ED Diagnoses Final diagnoses:  Sciatica of left side    Rx / DC Orders ED Discharge Orders          Ordered    methylPREDNISolone (MEDROL DOSEPAK) 4 MG TBPK tablet        03/25/21 0639    oxyCODONE-acetaminophen (PERCOCET/ROXICET) 5-325 MG tablet  Every 6 hours PRN        03/25/21 0639             Merryl Hacker, MD 03/25/21 [phone]

## 2021-03-25 NOTE — Discharge Instructions (Addendum)
Call your orthopedist office to see if you can get your injection sooner.  Return for any new or worsening symptoms

## 2021-03-25 NOTE — Telephone Encounter (Signed)
Pt called stating he's scheduled for an inj on 04/01/21 but he's in a lot of pain. Pt would like a CB to try to get a sooner appt.  [phone]

## 2021-03-26 ENCOUNTER — Encounter: Payer: Self-pay | Admitting: Gastroenterology

## 2021-03-26 NOTE — Progress Notes (Signed)
Referring Provider: Rosita Fire, MD Primary Care Physician:  Rosita Fire, MD Primary Gastroenterologist:  Dr. Gala Romney  Chief Complaint  Patient presents with   Colonoscopy    HPI:   Craig Cole is a 60 y.o. male presenting today at the request of Rosita Fire, MD for consult colonoscopy.  Office visit due to BMI.  From [place].  Reports having a few colonoscopies in the past with polyps and has been on 5-year recall. Last colonoscopy was more than 5 years ago at [facility] in [place].  Also reports episode of diverticulitis in 2000 and thinks he had a flare of diverticulitis in July or August this year, but he did not get any antibiotics for this.  States he was to run its course.  Denies abdominal pain, constipation, diarrhea, brbpr, melena, nausea, vomiting. GERD well controlled with Nexium. No dysphagia.   Reports he was "completely out" for his prior colonoscopies.   Past Medical History:  Diagnosis Date   Cancer ([place])    skin cancer   GERD (gastroesophageal reflux disease)    Glaucoma    High risk sexual behavior    on PrEP for HIV   Hyperlipidemia    Hypertension    Sleep apnea     Past Surgical History:  Procedure Laterality Date   COLONOSCOPY     RETINAL DETACHMENT SURGERY Left    TONSILLECTOMY      Current Outpatient Medications  Medication Sig Dispense Refill   aspirin EC 81 MG tablet Take 81 mg by mouth daily.     diltiazem (CARDIZEM CD) 240 MG 24 hr capsule Take 240 mg by mouth daily.     dorzolamide-timolol (COSOPT) 22.3-6.8 MG/ML ophthalmic solution Place 1 drop into the left eye 2 (two) times daily.     emtricitabine-tenofovir AF (DESCOVY) 200-25 MG tablet Take 1 tablet by mouth daily. 90 tablet 0   esomeprazole (NEXIUM) 40 MG capsule Take 40 mg by mouth every morning.     hydrALAZINE (APRESOLINE) 25 MG tablet Take 25 mg by mouth 2 (two) times daily.     lisinopril (ZESTRIL) 40 MG tablet Take 1 tablet by mouth daily.      methylPREDNISolone (MEDROL DOSEPAK) 4 MG TBPK tablet Take as directed on packet 21 tablet 0   montelukast (SINGULAIR) 10 MG tablet Take 10 mg by mouth at bedtime.     oxyCODONE-acetaminophen (PERCOCET/ROXICET) 5-325 MG tablet Take 1 tablet by mouth every 6 (six) hours as needed for severe pain. 10 tablet 0   simvastatin (ZOCOR) 40 MG tablet Take 40 mg by mouth at bedtime.     tadalafil (CIALIS) 5 MG tablet Take 5 mg by mouth daily.     Tafluprost, PF, 0.0015 % SOLN Place 0.0015 % into both eyes at bedtime.     tamsulosin (FLOMAX) 0.4 MG CAPS capsule Take 0.4 mg by mouth at bedtime.     No current facility-administered medications for this visit.    Allergies as of 03/27/2021   (No Known Allergies)    Family History  Problem Relation Age of Onset   Hypertension Mother    Diabetes Mother    Myasthenia gravis Father    Prostate cancer Brother    Colon cancer Neg Hx     Social History   Socioeconomic History   Marital status: Single    Spouse name: Not on file   Number of children: Not on file   Years of education: Not on file   Highest education level:  Not on file  Occupational History   Not on file  Tobacco Use   Smoking status: Never   Smokeless tobacco: Never  Vaping Use   Vaping Use: Never used  Substance and Sexual Activity   Alcohol use: Not Currently   Drug use: Never   Sexual activity: Not on file  Other Topics Concern   Not on file  Social History Narrative   Not on file   Social Determinants of Health   Financial Resource Strain: Not on file  Food Insecurity: Not on file  Transportation Needs: Not on file  Physical Activity: Not on file  Stress: Not on file  Social Connections: Not on file  Intimate Partner Violence: Not on file    Review of Systems: Gen: Denies any fever, chills, cold or flulike symptoms, presyncope, syncope. CV: Denies chest pain, heart palpitations. Resp: Denies shortness of breath or cough. GI: See HPI GU : Denies urinary  burning, urinary frequency, urinary hesitancy MS: Admits to sciatica pain Derm: Denies rash Psych: Denies depression, anxiety Heme: See HPI  Physical Exam: BP 138/82   Pulse 72   Temp (!) 97.4 F (36.3 C) (Temporal)   Ht 6\' 5"  (1.956 m)   Wt (!) 330 lb (149.7 kg)   BMI 39.13 kg/m  General:   Alert and oriented. Pleasant and cooperative. Well-nourished and well-developed.  Head:  Normocephalic and atraumatic. Eyes:  Without icterus, sclera clear and conjunctiva pink.  Ears:  Normal auditory acuity. Lungs:  Clear to auscultation bilaterally. No wheezes, rales, or rhonchi. No distress.  Heart:  S1, S2 present without murmurs appreciated.  Abdomen:  +BS, soft, non-tender and non-distended. No HSM noted. No guarding or rebound. No masses appreciated.  Rectal:  Deferred  Msk:  Symmetrical without gross deformities. Normal posture. Extremities:  Without trace edema and chronic venous stasis changes.  Neurologic:  Alert and  oriented x4;  grossly normal neurologically. Skin:  Intact without significant lesions or rashes. Psych:  Normal mood and affect.    Assessment:  60 year old male, HTN, HLD, sleep apnea on CPAP, and reported history of colon polyps presenting today at the request of Dr. Legrand Rams to schedule surveillance colonoscopy.  Patient reports his last colonoscopy was more than 5 years ago at [facility] in [place].  Reports due to his history of colon polyps, he has been on 5-year recall.  Also reports history of diverticulitis in 2000 and thinks he had a flare in July or August this year, but no imaging pursued or antibiotics prescribed; he just let it "run its course". Currently without any significant GI symptoms.  No alarm symptoms.  No family history of colon cancer.   Plan: Proceed with colonoscopy with propofol with Dr. Gala Romney in the near future. The risks, benefits, and alternatives have been discussed with the patient in detail. The patient states  understanding and desires to proceed. ASA 2 Opted for propofol as it is unclear what sedation was used for his prior colonoscopies and patient reports he was "completely out". Will attempt to obtain prior colonoscopy records.  Follow-up as needed.   Aliene Altes, PA-C [facility] 03/27/2021

## 2021-03-26 NOTE — Telephone Encounter (Signed)
Added to wait list and notified patient.

## 2021-03-27 ENCOUNTER — Other Ambulatory Visit (HOSPITAL_COMMUNITY): Payer: Self-pay

## 2021-03-27 ENCOUNTER — Ambulatory Visit (INDEPENDENT_AMBULATORY_CARE_PROVIDER_SITE_OTHER): Payer: BC Managed Care – PPO | Admitting: Gastroenterology

## 2021-03-27 ENCOUNTER — Other Ambulatory Visit: Payer: Self-pay

## 2021-03-27 ENCOUNTER — Ambulatory Visit (INDEPENDENT_AMBULATORY_CARE_PROVIDER_SITE_OTHER): Payer: BC Managed Care – PPO | Admitting: Pharmacist

## 2021-03-27 ENCOUNTER — Telehealth: Payer: Self-pay

## 2021-03-27 ENCOUNTER — Encounter: Payer: Self-pay | Admitting: Gastroenterology

## 2021-03-27 ENCOUNTER — Ambulatory Visit: Payer: BC Managed Care – PPO | Admitting: Physician Assistant

## 2021-03-27 VITALS — BP 138/82 | HR 72 | Temp 97.4°F | Ht 77.0 in | Wt 330.0 lb

## 2021-03-27 DIAGNOSIS — Z79899 Other long term (current) drug therapy: Secondary | ICD-10-CM | POA: Diagnosis not present

## 2021-03-27 DIAGNOSIS — Z23 Encounter for immunization: Secondary | ICD-10-CM | POA: Diagnosis not present

## 2021-03-27 DIAGNOSIS — Z86010 Personal history of colonic polyps: Secondary | ICD-10-CM | POA: Diagnosis not present

## 2021-03-27 NOTE — Telephone Encounter (Signed)
Will call pt to schedule TCS w/Propofol ASA2 with Dr. Gala Romney when his next schedule is available.

## 2021-03-27 NOTE — Progress Notes (Signed)
Date:  03/27/2021   HPI: Craig Cole is a 60 y.o. male who presents to the [facility] [facility] for HIV PrEP follow-up.  Insured   [x]    Uninsured  []    Patient Active Problem List   Diagnosis Date Noted   History of colon polyps 03/27/2021   On pre-exposure prophylaxis for HIV 09/10/2020   High risk sexual behavior 09/10/2020    Patient's Medications  New Prescriptions   No medications on file  Previous Medications   ASPIRIN EC 81 MG TABLET    Take 81 mg by mouth daily.   DILTIAZEM (CARDIZEM CD) 240 MG 24 HR CAPSULE    Take 240 mg by mouth daily.   DORZOLAMIDE-TIMOLOL (COSOPT) 22.3-6.8 MG/ML OPHTHALMIC SOLUTION    Place 1 drop into the left eye 2 (two) times daily.   EMTRICITABINE-TENOFOVIR AF (DESCOVY) 200-25 MG TABLET    Take 1 tablet by mouth daily.   ESOMEPRAZOLE (NEXIUM) 40 MG CAPSULE    Take 40 mg by mouth every morning.   HYDRALAZINE (APRESOLINE) 25 MG TABLET    Take 25 mg by mouth 2 (two) times daily.   LISINOPRIL (ZESTRIL) 40 MG TABLET    Take 1 tablet by mouth daily.   METHYLPREDNISOLONE (MEDROL DOSEPAK) 4 MG TBPK TABLET    Take as directed on packet   MONTELUKAST (SINGULAIR) 10 MG TABLET    Take 10 mg by mouth at bedtime.   OXYCODONE-ACETAMINOPHEN (PERCOCET/ROXICET) 5-325 MG TABLET    Take 1 tablet by mouth every 6 (six) hours as needed for severe pain.   SIMVASTATIN (ZOCOR) 40 MG TABLET    Take 40 mg by mouth at bedtime.   TADALAFIL (CIALIS) 5 MG TABLET    Take 5 mg by mouth daily.   TAFLUPROST, PF, 0.0015 % SOLN    Place 0.0015 % into both eyes at bedtime.   TAMSULOSIN (FLOMAX) 0.4 MG CAPS CAPSULE    Take 0.4 mg by mouth at bedtime.  Modified Medications   No medications on file  Discontinued Medications   No medications on file    Allergies: No Known Allergies  Past Medical History: Past Medical History:  Diagnosis Date   Cancer ([place])    skin cancer   GERD (gastroesophageal reflux disease)    Glaucoma    High risk sexual behavior    on PrEP for HIV    Hyperlipidemia    Hypertension    Sleep apnea     Social History: Social History   Socioeconomic History   Marital status: Single    Spouse name: Not on file   Number of children: Not on file   Years of education: Not on file   Highest education level: Not on file  Occupational History   Not on file  Tobacco Use   Smoking status: Never   Smokeless tobacco: Never  Vaping Use   Vaping Use: Never used  Substance and Sexual Activity   Alcohol use: Not Currently   Drug use: Never   Sexual activity: Not on file  Other Topics Concern   Not on file  Social History Narrative   Not on file   Social Determinants of Health   Financial Resource Strain: Not on file  Food Insecurity: Not on file  Transportation Needs: Not on file  Physical Activity: Not on file  Stress: Not on file  Social Connections: Not on file    CHL HIV PREP FLOWSHEET RESULTS 07/12/2019  Insurance Status Insured  Gender at birth Male  Gender identity cis-Male  Risk for HIV Condomless vaginal or anal intercourse;Hx of STI  Sex Partners Men only  # sex partners past 3-6 mos 4-6  Sex activity preferences Oral;Receptive  Condom use No  HIV symptoms? N/A    Labs:  SCr: Lab Results  Component Value Date   CREATININE 0.70 (L) 10/30/2020   CREATININE 0.73 07/05/2020   CREATININE 0.72 06/10/2020   CREATININE 0.77 10/17/2019   CREATININE 0.73 07/12/2019   HIV Lab Results  Component Value Date   HIV NON-REACTIVE 12/02/2020   HIV NON-REACTIVE 09/10/2020   HIV NON-REACTIVE 06/10/2020   HIV NON-REACTIVE 03/11/2020   HIV NON-REACTIVE 10/17/2019   Hepatitis B Lab Results  Component Value Date   HEPBSAB NON-REACTIVE 07/12/2019   HEPBSAG NON-REACTIVE 07/12/2019   Hepatitis C Lab Results  Component Value Date   HEPCAB NON-REACTIVE 07/12/2019   Hepatitis A Lab Results  Component Value Date   HAV REACTIVE (A) 07/12/2019   RPR and STI Lab Results  Component Value Date   LABRPR REACTIVE (A)  09/10/2020   LABRPR REACTIVE (A) 03/11/2020   LABRPR REACTIVE (A) 07/12/2019   RPRTITER 1:4 (H) 09/10/2020   RPRTITER 1:4 (H) 03/11/2020   RPRTITER 1:8 (H) 07/12/2019    STI Results GC CT  12/02/2020 Negative Negative  12/02/2020 Negative Negative  12/02/2020 Negative Negative  09/10/2020 Negative Negative  09/10/2020 Negative Positive(A)  09/10/2020 Negative Negative  03/11/2020 Negative Negative  07/12/2019 Negative Negative    Assessment: Craig Cole is here today to follow up for HIV PrEP. He continues to take Descovy every day without any issues or side effects. He has no trouble getting it from the pharmacy or paying for the medication. He has had no new partners since he last saw me. He has recently lost 30 lbs and is actively trying to lose weight. Will check labs today and see him back in 3 months. Administered his flu shot today.  He is not interested in Apretude at this time.   Plan: - HIV antibody today - Flu shot - Descovy x 3 months if HIV negative - F/u in 3 months   Sueellen Kayes L. Chaim Gatley, PharmD, BCIDP, [place], CPP Clinical Pharmacist Practitioner Infectious Diseases [place] 03/27/2021, 2:07 PM

## 2021-03-27 NOTE — Patient Instructions (Signed)
We will arrange for you to have a colonoscopy in the near future with Dr. Gala Romney.   We will see you back as needed. Do not hesitate to call with any questions or concerns.  It was great to meet you today!   Aliene Altes, PA-C [facility]

## 2021-03-28 LAB — HIV ANTIBODY (ROUTINE TESTING W REFLEX): HIV 1&2 Ab, 4th Generation: NONREACTIVE

## 2021-03-28 MED ORDER — DESCOVY 200-25 MG PO TABS: 1.0000 | ORAL_TABLET | Freq: Every day | ORAL | 0 refills | Status: DC

## 2021-04-01 ENCOUNTER — Ambulatory Visit (INDEPENDENT_AMBULATORY_CARE_PROVIDER_SITE_OTHER): Payer: BC Managed Care – PPO | Admitting: Physical Medicine and Rehabilitation

## 2021-04-01 ENCOUNTER — Encounter: Payer: Self-pay | Admitting: Physical Medicine and Rehabilitation

## 2021-04-01 ENCOUNTER — Other Ambulatory Visit: Payer: Self-pay

## 2021-04-01 ENCOUNTER — Ambulatory Visit: Payer: Self-pay

## 2021-04-01 VITALS — BP 136/77 | HR 86

## 2021-04-01 DIAGNOSIS — M5416 Radiculopathy, lumbar region: Secondary | ICD-10-CM | POA: Diagnosis not present

## 2021-04-01 MED ORDER — BETAMETHASONE SOD PHOS & ACET 6 (3-3) MG/ML IJ SUSP
12.0000 mg | Freq: Once | INTRAMUSCULAR | Status: AC
  Administered 2021-04-01: 12 mg

## 2021-04-01 NOTE — Progress Notes (Signed)
Pt state lower back pain that travels down his left leg. Pt state walking and standing makes the pain worse. Pt state he takes pain meds and had gotten inj at the ER a month ago.  Numeric Pain Rating Scale and Functional Assessment Average Pain 3   In the last MONTH (on 0-10 scale) has pain interfered with the following?  1. General activity like being  able to carry out your everyday physical activities such as walking, climbing stairs, carrying groceries, or moving a chair?  Rating(10)   +Driver, -BT, -Dye Allergies.

## 2021-04-01 NOTE — Patient Instructions (Signed)

## 2021-04-02 MED ORDER — PEG 3350-KCL-NA BICARB-NACL 420 G PO SOLR: ORAL | 0 refills | Status: DC

## 2021-04-02 NOTE — Addendum Note (Signed)
Addended by: Cheron Every on: 04/02/2021 09:19 AM   Modules accepted: Orders

## 2021-04-02 NOTE — Telephone Encounter (Signed)
Called pt. He has been scheduled for 11/7 at 8:45am. Aware will send rx for prep to pharmacy and will mail prep instructions. Confirmed pharmacy and mailing address.

## 2021-04-14 ENCOUNTER — Encounter: Payer: Self-pay | Admitting: Physician Assistant

## 2021-04-14 ENCOUNTER — Ambulatory Visit (INDEPENDENT_AMBULATORY_CARE_PROVIDER_SITE_OTHER): Payer: BC Managed Care – PPO | Admitting: Physician Assistant

## 2021-04-14 DIAGNOSIS — M48061 Spinal stenosis, lumbar region without neurogenic claudication: Secondary | ICD-10-CM | POA: Diagnosis not present

## 2021-04-14 NOTE — Progress Notes (Signed)
HPI: Mr. Limbaugh returns today status post epidural steroid injection with Dr. Ernestina Patches appears that the injection was done on 04/01/2021.  Notes not available at this point time.  MRI of his lumbar spine actual images unavailable MRI report is reviewed this was done at [facility].  They had severe spinal stenosis at L3-4.  Moderately severe bilateral neuroforaminal stenosis at this level.  L4-5 moderate left and mild right facet arthropathy moderately severe left neural foraminal stenosis.  L5-S1 moderately severe left-sided are particular zone stenosis with impingement on the descending left S1 nerve root.  Severe left neural foraminal stenosis with impingement on the exiting L5 nerve root.  Mild to moderate right neural foraminal stenosis. Patient states his back pain is now 1-2 out of 10 pain at worst.  Feels like a "general arthritis pain".  Review of systems: See HPI otherwise negative  Physical exam: General well-developed well-nourished male no acute distress mood affect appropriate Lower extremities: Negative straight leg raise bilaterally.  Impression: Lumbar stenosis  Plan: We will refer him to physical therapy to get a home exercise program for his back.  He will follow-up with [place] as needed.  I did discuss with him if his back pain comes back and he wants a additional epidural steroid injection would recommend he contact Dr. Ernestina Patches and set this up.  Questions were encouraged and answered.

## 2021-04-17 NOTE — Progress Notes (Signed)
Craig Cole - 60 y.o. male MRN [id number]  Date of birth: 1960/08/21  Office Visit Note: Visit Date: 04/01/2021 PCP: Rosita Fire, MD Referred by: Rosita Fire, MD  Subjective: Chief Complaint  Patient presents with   Lower Back - Pain   Left Leg - Pain   HPI:  Craig Cole is a 60 y.o. male who comes in today at the request of Dr. Jean Rosenthal for planned Left L5-S1 Lumbar Interlaminar epidural steroid injection with fluoroscopic guidance.  The patient has failed conservative care including home exercise, medications, time and activity modification.  This injection will be diagnostic and hopefully therapeutic.  Please see requesting physician notes for further details and justification. MRI reviewed with images and spine model.  MRI reviewed in the note below.    ROS Otherwise per HPI.  Assessment & Plan: Visit Diagnoses:    ICD-10-CM   1. Lumbar radiculopathy  M54.16 XR C-ARM NO REPORT    Epidural Steroid injection    betamethasone acetate-betamethasone sodium phosphate (CELESTONE) injection 12 mg      Plan: No additional findings.   Meds & Orders:  Meds ordered this encounter  Medications   betamethasone acetate-betamethasone sodium phosphate (CELESTONE) injection 12 mg    Orders Placed This Encounter  Procedures   XR C-ARM NO REPORT   Epidural Steroid injection    Follow-up: Return if symptoms worsen or fail to improve.   Procedures: No procedures performed  Lumbar Epidural Steroid Injection - Interlaminar Approach with Fluoroscopic Guidance  Patient: Craig Cole      Date of Birth: August 16, 1960 MRN: [id number] PCP: Rosita Fire, MD      Visit Date: 04/01/2021   Universal Protocol:     Consent Given By: the patient  Position: PRONE  Additional Comments: Vital signs were monitored before and after the procedure. Patient was prepped and draped in the usual sterile fashion. The correct patient, procedure, and site was  verified.   Injection Procedure Details:   Procedure diagnoses: Lumbar radiculopathy [M54.16]   Meds Administered:  Meds ordered this encounter  Medications   betamethasone acetate-betamethasone sodium phosphate (CELESTONE) injection 12 mg     Laterality: Left  Location/Site:  L5-S1  Needle: 3.5 in., 20 ga. Tuohy  Needle Placement: Paramedian epidural  Findings:   -Comments: Excellent flow of contrast into the epidural space.  Procedure Details: Using a paramedian approach from the side mentioned above, the region overlying the inferior lamina was localized under fluoroscopic visualization and the soft tissues overlying this structure were infiltrated with 4 ml. of 1% Lidocaine without Epinephrine. The Tuohy needle was inserted into the epidural space using a paramedian approach.   The epidural space was localized using loss of resistance along with counter oblique bi-planar fluoroscopic views.  After negative aspirate for air, blood, and CSF, a 2 ml. volume of Isovue-250 was injected into the epidural space and the flow of contrast was observed. Radiographs were obtained for documentation purposes.    The injectate was administered into the level noted above.   Additional Comments:  The patient tolerated the procedure well Dressing: 2 x 2 sterile gauze and Band-Aid    Post-procedure details: Patient was observed during the procedure. Post-procedure instructions were reviewed.  Patient left the clinic in stable condition.   Clinical History: MRI LUMBAR SPINE WITHOUT CONTRAST   INDICATION: Low back pain, unspecified. Acute left-sided low back pain, unspecified whether sciatica present.   TECHNIQUE: Multiplanar, multisequence MR imaging of the lumbar spine without IV contrast.  COMPARISON: None.   FINDINGS: For the purposes of this dictation, it is assumed that the most caudal fully segmented lumbar-type vertebra is L5, and that the most caudal fully developed  intervertebral disc is L5-S1.   Degenerative disc disease (DDD) and facet arthropathy (see below).   Vertebral alignment: Moderate lumbar levoscoliosis. Mild anterolisthesis and mild leftward lateral subluxation of L3 on L4.   Vertebral body heights: No compression fracture.   Marrow signal: No significant abnormality. Intraosseous "hemangioma" in the L5 vertebral body.   Conus medullaris: Terminates at a normal level (upper L2). Normal in size, contour, and signal intensity.   Individual disc levels:   T12-L1 (sagittal images only): No significant spinal canal or neural foraminal stenosis.  L1-2 (sagittal images only): No significant spinal canal or neural foraminal stenosis.  L2-3: No significant spinal canal stenosis. Mild bilateral neural foraminal stenosis.   L3-4: Moderate DDD, including a small central zone disc protrusion (age-indeterminate). No definite disc extrusion. Moderately severe bilateral facet arthropathy. Mild anterolisthesis and mild leftward lateral subluxation. Severe spinal canal stenosis (central zone and bilateral subarticular zones). Moderately severe bilateral neural foraminal stenosis.   L4-5: Mild DDD, including a small left central and left subarticular zone disc protrusion (age-indeterminate). No disc extrusion. Moderate left and mild right facet arthropathy. Moderate left subarticular zone stenosis. No significant central zone or right subarticular zone stenosis. Moderately severe left neural foraminal stenosis. Mild right neural foraminal stenosis.   L5-S1: Mild-to-moderate DDD, worse on the left side. Moderate left and mild right facet arthropathy. Moderately severe left subarticular zone stenosis with impingement on the descending left S1 nerve root. No significant central zone or right subarticular zone stenosis. Severe left neural foraminal stenosis with impingement on the exiting left L5 nerve root. Mild-to-moderate right neural foraminal stenosis.    Other: Exophytic lower pole right renal cyst (approximately 2.3 cm).    IMPRESSION:   L3-4: Moderate DDD, including a small central zone disc protrusion (age-indeterminate). No definite disc extrusion. Moderately severe bilateral facet arthropathy. Mild anterolisthesis and mild leftward lateral subluxation. Severe spinal canal stenosis (central zone and bilateral subarticular zones). Moderately severe bilateral neural foraminal stenosis.   L4-5: Mild DDD, including a small left central and left subarticular zone disc protrusion (age-indeterminate). No disc extrusion. Moderate left and mild right facet arthropathy. Moderate left subarticular zone stenosis. No significant central zone or right subarticular zone stenosis. Moderately severe left neural foraminal stenosis. Mild right neural foraminal stenosis.   L5-S1: Mild-to-moderate DDD, worse on the left side. Moderate left and mild right facet arthropathy. Moderately severe left subarticular zone stenosis with impingement on the descending left S1 nerve root. No significant central zone or right subarticular zone stenosis. Severe left neural foraminal stenosis with impingement on the exiting left L5 nerve root. Mild-to-moderate right neural foraminal stenosis.    Electronically Signed by: Mali Holder on 03/12/2021 6:23 PM     Objective:  VS:  HT:    WT:   BMI:     BP:136/77  HR:86bpm  TEMP: ( )  RESP:  Physical Exam Vitals and nursing note reviewed.  Constitutional:      General: He is not in acute distress.    Appearance: Normal appearance. He is not ill-appearing.  HENT:     Head: Normocephalic and atraumatic.     Right Ear: External ear normal.     Left Ear: External ear normal.     Nose: No congestion.  Eyes:     Extraocular Movements: Extraocular movements intact.  Cardiovascular:     Rate and Rhythm: Normal rate.     Pulses: Normal pulses.  Pulmonary:     Effort: Pulmonary effort is normal. No respiratory distress.   Abdominal:     General: There is no distension.     Palpations: Abdomen is soft.  Musculoskeletal:        General: No tenderness or signs of injury.     Cervical back: Neck supple.     Right lower leg: No edema.     Left lower leg: No edema.     Comments: Patient has good distal strength without clonus.  Skin:    Findings: No erythema or rash.  Neurological:     General: No focal deficit present.     Mental Status: He is alert and oriented to person, place, and time.     Sensory: No sensory deficit.     Motor: No weakness or abnormal muscle tone.     Coordination: Coordination normal.  Psychiatric:        Mood and Affect: Mood normal.        Behavior: Behavior normal.     Imaging: No results found.

## 2021-04-17 NOTE — Procedures (Signed)
Lumbar Epidural Steroid Injection - Interlaminar Approach with Fluoroscopic Guidance  Patient: Craig Cole      Date of Birth: 1961-06-19 MRN: [id number] PCP: Rosita Fire, MD      Visit Date: 04/01/2021   Universal Protocol:     Consent Given By: the patient  Position: PRONE  Additional Comments: Vital signs were monitored before and after the procedure. Patient was prepped and draped in the usual sterile fashion. The correct patient, procedure, and site was verified.   Injection Procedure Details:   Procedure diagnoses: Lumbar radiculopathy [M54.16]   Meds Administered:  Meds ordered this encounter  Medications   betamethasone acetate-betamethasone sodium phosphate (CELESTONE) injection 12 mg     Laterality: Left  Location/Site:  L5-S1  Needle: 3.5 in., 20 ga. Tuohy  Needle Placement: Paramedian epidural  Findings:   -Comments: Excellent flow of contrast into the epidural space.  Procedure Details: Using a paramedian approach from the side mentioned above, the region overlying the inferior lamina was localized under fluoroscopic visualization and the soft tissues overlying this structure were infiltrated with 4 ml. of 1% Lidocaine without Epinephrine. The Tuohy needle was inserted into the epidural space using a paramedian approach.   The epidural space was localized using loss of resistance along with counter oblique bi-planar fluoroscopic views.  After negative aspirate for air, blood, and CSF, a 2 ml. volume of Isovue-250 was injected into the epidural space and the flow of contrast was observed. Radiographs were obtained for documentation purposes.    The injectate was administered into the level noted above.   Additional Comments:  The patient tolerated the procedure well Dressing: 2 x 2 sterile gauze and Band-Aid    Post-procedure details: Patient was observed during the procedure. Post-procedure instructions were reviewed.  Patient left the clinic  in stable condition.

## 2021-04-18 DIAGNOSIS — Z23 Encounter for immunization: Secondary | ICD-10-CM | POA: Diagnosis not present

## 2021-04-22 DIAGNOSIS — M545 Low back pain, unspecified: Secondary | ICD-10-CM | POA: Diagnosis not present

## 2021-04-22 DIAGNOSIS — R531 Weakness: Secondary | ICD-10-CM | POA: Diagnosis not present

## 2021-04-22 DIAGNOSIS — M2560 Stiffness of unspecified joint, not elsewhere classified: Secondary | ICD-10-CM | POA: Diagnosis not present

## 2021-04-22 DIAGNOSIS — R2689 Other abnormalities of gait and mobility: Secondary | ICD-10-CM | POA: Diagnosis not present

## 2021-04-23 ENCOUNTER — Encounter: Payer: Self-pay | Admitting: Gastroenterology

## 2021-04-23 ENCOUNTER — Telehealth: Payer: Self-pay | Admitting: Gastroenterology

## 2021-04-23 NOTE — Telephone Encounter (Signed)
Received and reviewed colonoscopy report from [facility] in [place] dated 05/25/2012.  Pre-op diagnosis: Screening Postop diagnosis: Rare diverticulum   I have updated patient's medical history and will have reports scanned into his chart.

## 2021-04-25 DIAGNOSIS — G4733 Obstructive sleep apnea (adult) (pediatric): Secondary | ICD-10-CM | POA: Diagnosis not present

## 2021-04-28 ENCOUNTER — Ambulatory Visit (HOSPITAL_COMMUNITY)
Admission: RE | Admit: 2021-04-28 | Discharge: 2021-04-28 | Disposition: A | Discharge status: Home / Self Care | Payer: BC Managed Care – PPO | Source: Ambulatory Visit | Attending: Internal Medicine | Admitting: Internal Medicine

## 2021-04-28 ENCOUNTER — Ambulatory Visit (HOSPITAL_COMMUNITY): Payer: BC Managed Care – PPO | Admitting: Anesthesiology

## 2021-04-28 ENCOUNTER — Encounter (HOSPITAL_COMMUNITY): Payer: Self-pay | Admitting: Internal Medicine

## 2021-04-28 ENCOUNTER — Other Ambulatory Visit: Payer: Self-pay

## 2021-04-28 ENCOUNTER — Encounter (HOSPITAL_COMMUNITY)
Admission: RE | Disposition: A | Discharge status: Home / Self Care | Payer: Self-pay | Source: Ambulatory Visit | Attending: Internal Medicine

## 2021-04-28 DIAGNOSIS — Z86010 Personal history of colonic polyps: Secondary | ICD-10-CM | POA: Diagnosis not present

## 2021-04-28 DIAGNOSIS — D124 Benign neoplasm of descending colon: Secondary | ICD-10-CM | POA: Insufficient documentation

## 2021-04-28 DIAGNOSIS — K5730 Diverticulosis of large intestine without perforation or abscess without bleeding: Secondary | ICD-10-CM | POA: Diagnosis not present

## 2021-04-28 DIAGNOSIS — G4730 Sleep apnea, unspecified: Secondary | ICD-10-CM | POA: Diagnosis not present

## 2021-04-28 DIAGNOSIS — D120 Benign neoplasm of cecum: Secondary | ICD-10-CM | POA: Diagnosis not present

## 2021-04-28 DIAGNOSIS — Z8042 Family history of malignant neoplasm of prostate: Secondary | ICD-10-CM | POA: Insufficient documentation

## 2021-04-28 DIAGNOSIS — Z1211 Encounter for screening for malignant neoplasm of colon: Secondary | ICD-10-CM | POA: Insufficient documentation

## 2021-04-28 DIAGNOSIS — K635 Polyp of colon: Secondary | ICD-10-CM | POA: Diagnosis not present

## 2021-04-28 DIAGNOSIS — K640 First degree hemorrhoids: Secondary | ICD-10-CM | POA: Diagnosis not present

## 2021-04-28 DIAGNOSIS — Z87891 Personal history of nicotine dependence: Secondary | ICD-10-CM | POA: Diagnosis not present

## 2021-04-28 HISTORY — PX: COLONOSCOPY WITH PROPOFOL: SHX5780

## 2021-04-28 HISTORY — PX: POLYPECTOMY: SHX5525

## 2021-04-28 SURGERY — COLONOSCOPY WITH PROPOFOL
Anesthesia: General

## 2021-04-28 MED ORDER — LIDOCAINE HCL (CARDIAC) PF 100 MG/5ML IV SOSY
PREFILLED_SYRINGE | INTRAVENOUS | Status: DC | PRN
  Administered 2021-04-28: 60 mg via INTRAVENOUS

## 2021-04-28 MED ORDER — LACTATED RINGERS IV SOLN: INTRAVENOUS | Status: DC

## 2021-04-28 MED ORDER — PROPOFOL 10 MG/ML IV BOLUS
INTRAVENOUS | Status: DC | PRN
  Administered 2021-04-28: 100 mg via INTRAVENOUS
  Administered 2021-04-28: 5 mg via INTRAVENOUS

## 2021-04-28 MED ORDER — PROPOFOL 500 MG/50ML IV EMUL
INTRAVENOUS | Status: DC | PRN
  Administered 2021-04-28: 150 ug/kg/min via INTRAVENOUS

## 2021-04-28 MED ORDER — PROPOFOL 500 MG/50ML IV EMUL
INTRAVENOUS | Status: AC
  Filled 2021-04-28: qty 50

## 2021-04-28 MED ORDER — LIDOCAINE HCL (PF) 2 % IJ SOLN
INTRAMUSCULAR | Status: AC
  Filled 2021-04-28: qty 5

## 2021-04-28 NOTE — Discharge Instructions (Addendum)
  Colonoscopy Discharge Instructions  Read the instructions outlined below and refer to this sheet in the next few weeks. These discharge instructions provide you with general information on caring for yourself after you leave the hospital. Your doctor may also give you specific instructions. While your treatment has been planned according to the most current medical practices available, unavoidable complications occasionally occur. If you have any problems or questions after discharge, call Dr. Gala Romney at [phone]. ACTIVITY You may resume your regular activity, but move at a slower pace for the next 24 hours.  Take frequent rest periods for the next 24 hours.  Walking will help get rid of the air and reduce the bloated feeling in your belly (abdomen).  No driving for 24 hours (because of the medicine (anesthesia) used during the test).   Do not sign any important legal documents or operate any machinery for 24 hours (because of the anesthesia used during the test).  NUTRITION Drink plenty of fluids.  You may resume your normal diet as instructed by your doctor.  Begin with a light meal and progress to your normal diet. Heavy or fried foods are harder to digest and may make you feel sick to your stomach (nauseated).  Avoid alcoholic beverages for 24 hours or as instructed.  MEDICATIONS You may resume your normal medications unless your doctor tells you otherwise.  WHAT YOU CAN EXPECT TODAY Some feelings of bloating in the abdomen.  Passage of more gas than usual.  Spotting of blood in your stool or on the toilet paper.  IF YOU HAD POLYPS REMOVED DURING THE COLONOSCOPY: No aspirin products for 7 days or as instructed.  No alcohol for 7 days or as instructed.  Eat a soft diet for the next 24 hours.  FINDING OUT THE RESULTS OF YOUR TEST Not all test results are available during your visit. If your test results are not back during the visit, make an appointment with your caregiver to find out the  results. Do not assume everything is normal if you have not heard from your caregiver or the medical facility. It is important for you to follow up on all of your test results.  SEEK IMMEDIATE MEDICAL ATTENTION IF: You have more than a spotting of blood in your stool.  Your belly is swollen (abdominal distention).  You are nauseated or vomiting.  You have a temperature over 101.  You have abdominal pain or discomfort that is severe or gets worse throughout the day.    3 polyps removed in your colon today  Colon polyp and diverticulosis information provided  Other recommendations to follow pending review of pathology report  At patient request, called Netta Corrigan at [phone] -rolled to voicemail.  Left a detailed message.

## 2021-04-28 NOTE — Op Note (Addendum)
[facility] Patient Name: Craig Cole Procedure Date: 04/28/2021 8:41 AM MRN: [id number] Date of Birth: [birth year] Attending MD: Norvel Richards , MD CSN: [id number] Age: 60 Admit Type: Outpatient Procedure:                Colonoscopy Indications:              High risk colon cancer surveillance: Personal                            history of colonic polyps Providers:                Norvel Richards, MD, Janeece Riggers, RN, Randa Spike, Technician Referring MD:              Medicines:                Propofol per Anesthesia Complications:            No immediate complications. Estimated Blood Loss:     Estimated blood loss was minimal. Procedure:                Pre-Anesthesia Assessment:                           - Prior to the procedure, a History and Physical                            was performed, and patient medications and                            allergies were reviewed. [name] tolerance of                            previous anesthesia was also reviewed. The risks                            and benefits of the procedure and the sedation                            options and risks were discussed with the patient.                            All questions were answered, and informed consent                            was obtained. Prior Anticoagulants: The patient has                            taken no previous anticoagulant or antiplatelet                            agents. ASA Grade Assessment: II - A patient with  mild systemic disease. After reviewing the risks                            and benefits, the patient was deemed in                            satisfactory condition to undergo the procedure.                           After obtaining informed consent, the colonoscope                            was passed under direct vision. Throughout the                            procedure, the  patient's blood pressure, pulse, and                            oxygen saturations were monitored continuously. The                            [id number]) scope was introduced through the                            anus and advanced to the the cecum, identified by                            appendiceal orifice and ileocecal valve. The                            colonoscopy was performed without difficulty. The                            patient tolerated the procedure well. The quality                            of the bowel preparation was adequate. Scope In: 9:08:24 AM Scope Out: 9:26:38 AM Scope Withdrawal Time: 0 hours 12 minutes 45 seconds  Total Procedure Duration: 0 hours 18 minutes 14 seconds  Findings:      The perianal and digital rectal examinations were normal.      Three semi-pedunculated polyps were found in the descending colon and       ileocecal valve. The polyps were 3 to 4 mm in size. Estimated blood loss       was minimal.      Scattered medium-mouthed diverticula were found in the entire colon.      Non-bleeding internal hemorrhoids were found during retroflexion. The       hemorrhoids were moderate, medium-sized and Grade I (internal       hemorrhoids that do not prolapse).      The exam was otherwise without abnormality on direct and retroflexion       views. Impression:               - Three 3 to 4 mm polyps in the descending colon  and at the ileocecal valve.                           - Diverticulosis in the entire examined colon.                           - Non-bleeding internal hemorrhoids.                           - The examination was otherwise normal on direct                            and retroflexion views. Moderate Sedation:      Moderate (conscious) sedation was personally administered by an       anesthesia professional. The following parameters were monitored: oxygen       saturation, heart rate, blood pressure,  respiratory rate, EKG, adequacy       of pulmonary ventilation, and response to care. Recommendation:           - Patient has a contact number available for                            emergencies. The signs and symptoms of potential                            delayed complications were discussed with the                            patient. Return to normal activities tomorrow.                            Written discharge instructions were provided to the                            patient.                           - Resume previous diet.                           - Continue present medications.                           - Repeat colonoscopy after studies are complete for                            surveillance.                           - Return to GI office (date not yet determined). Procedure Code(s):        --- Professional ---                           [phone], Colonoscopy, flexible; diagnostic, including                            collection of  specimen(s) by brushing or washing,                            when performed (separate procedure) Diagnosis Code(s):        --- Professional ---                           Z86.010, Personal history of colonic polyps                           K63.5, Polyp of colon                           K64.0, First degree hemorrhoids                           K57.30, Diverticulosis of large intestine without                            perforation or abscess without bleeding CPT copyright 2019 [employer]. All rights reserved. The codes documented in this report are preliminary and upon coder review may  be revised to meet current compliance requirements. Cristopher Estimable. Jamaar Howes, MD Norvel Richards, MD 04/28/2021 9:36:33 AM This report has been signed electronically. Number of Addenda: 1 Addendum Number: 1   Addendum Date: 05/05/2021 11:36:43 AM      polyps found were removed via cold snare technique Cristopher Estimable. Tien Aispuro, MD Norvel Richards, MD 05/05/2021 11:37:35 AM This report has been signed electronically.

## 2021-04-28 NOTE — Transfer of Care (Signed)
Immediate Anesthesia Transfer of Care Note  Patient: Craig Cole  Procedure(s) Performed: COLONOSCOPY WITH PROPOFOL POLYPECTOMY  Patient Location: PACU  Anesthesia Type:General  Level of Consciousness: oriented  Airway & Oxygen Therapy: Patient Spontanous Breathing  Post-op Assessment: Report given to RN, Post -op Vital signs reviewed and stable and Patient moving all extremities X 4  Post vital signs: Reviewed and stable  Last Vitals:  Vitals Value Taken Time  BP    Temp    Pulse    Resp    SpO2      Last Pain:  Vitals:   04/28/21 0903  TempSrc:   PainSc: 0-No pain      Patients Stated Pain Goal: 3 (83/66/29 [id number])  Complications: No notable events documented.

## 2021-04-28 NOTE — Anesthesia Preprocedure Evaluation (Addendum)
Anesthesia Evaluation  Patient identified by MRN, date of birth, ID band Patient awake    Reviewed: Allergy & Precautions, NPO status , Patient's Chart, lab work & pertinent test results  Airway Mallampati: II  TM Distance: >3 FB Neck ROM: Full    Dental  (+) Dental Advisory Given, Implants   Pulmonary sleep apnea and Continuous Positive Airway Pressure Ventilation , former smoker,    Pulmonary exam normal breath sounds clear to auscultation       Cardiovascular Exercise Tolerance: Good hypertension, Pt. on medications Normal cardiovascular exam Rhythm:Regular Rate:Normal     Neuro/Psych negative neurological ROS  negative psych ROS   GI/Hepatic Neg liver ROS, GERD  Medicated and Controlled,  Endo/Other  negative endocrine ROS  Renal/GU negative Renal ROS  negative genitourinary   Musculoskeletal negative musculoskeletal ROS (+)   Abdominal   Peds negative pediatric ROS (+)  Hematology negative hematology ROS (+)   Anesthesia Other Findings Left retinal detachment sx  Reproductive/Obstetrics negative OB ROS                            Anesthesia Physical Anesthesia Plan  ASA: 3  Anesthesia Plan: General   Post-op Pain Management:    Induction: Intravenous  PONV Risk Score and Plan: TIVA  Airway Management Planned: Nasal Cannula, Natural Airway and Simple Face Mask  Additional Equipment:   Intra-op Plan:   Post-operative Plan:   Informed Consent: I have reviewed the patients History and Physical, chart, labs and discussed the procedure including the risks, benefits and alternatives for the proposed anesthesia with the patient or authorized representative who has indicated his/her understanding and acceptance.     Dental advisory given  Plan Discussed with: CRNA and Surgeon  Anesthesia Plan Comments:        Anesthesia Quick Evaluation

## 2021-04-28 NOTE — H&P (Signed)
@LOGO @   Primary Care Physician:  Rosita Fire, MD Primary Gastroenterologist:  Dr. Gala Romney  Pre-Procedure History & Physical: HPI:  Craig Cole is a 60 y.o. male here for here for surveillance colonoscopy.  Reported history of polyps previously.  However, records from [place] revealed patient had rare diverticulosis but no polyps reported several years ago at last TCS.  Patient reports polyps being removed previous to his last colonoscopy.   Past Medical History:  Diagnosis Date   Cancer [facility])    skin cancer   GERD (gastroesophageal reflux disease)    Glaucoma    High risk sexual behavior    on PrEP for HIV   Hyperlipidemia    Hypertension    Sleep apnea     Past Surgical History:  Procedure Laterality Date   COLONOSCOPY  05/25/2012   [facility]; rare diverticulum.   RETINAL DETACHMENT SURGERY Left    TONSILLECTOMY      Prior to Admission medications   Medication Sig Start Date End Date Taking? Authorizing Provider  aspirin EC 81 MG tablet Take 81 mg by mouth every evening.   Yes [provider]  Cholecalciferol (VITAMIN D3 PO) Take 1 tablet by mouth daily.   Yes [provider]  dorzolamide-timolol (COSOPT) 22.3-6.8 MG/ML ophthalmic solution Place 1 drop into the left eye 2 (two) times daily.   Yes [provider]  emtricitabine-tenofovir AF (DESCOVY) 200-25 MG tablet Take 1 tablet by mouth daily. Patient taking differently: Take 1 tablet by mouth every evening. 03/28/21  Yes Kuppelweiser, Cassie L, RPH-CPP  esomeprazole (NEXIUM) 40 MG capsule Take 40 mg by mouth every morning. 05/31/19  Yes [provider]  hydrALAZINE (APRESOLINE) 25 MG tablet Take 25 mg by mouth 2 (two) times daily. 01/27/21  Yes [provider]  ibuprofen (ADVIL) 200 MG tablet Take 800 mg by mouth every 8 (eight) hours as needed (pain.).   Yes [provider]  lisinopril (ZESTRIL) 40 MG tablet Take 40 mg by  mouth in the morning. 06/27/20  Yes [provider]  MAGNESIUM PO Take 1 tablet by mouth daily.   Yes [provider]  MATZIM LA 240 MG 24 hr tablet Take 240 mg by mouth in the morning. 02/25/21  Yes [provider]  montelukast (SINGULAIR) 10 MG tablet Take 10 mg by mouth at bedtime. 05/31/19  Yes [provider]  Multiple Vitamins-Minerals (ZINC PO) Take 1 tablet by mouth daily.   Yes [provider]  simvastatin (ZOCOR) 40 MG tablet Take 40 mg by mouth at bedtime. 05/31/19  Yes [provider]  tadalafil (CIALIS) 5 MG tablet Take 5 mg by mouth in the morning. 09/27/20  Yes [provider]  Tafluprost, PF, 0.0015 % SOLN Place 1 drop into both eyes at bedtime.   Yes [provider]  tamsulosin (FLOMAX) 0.4 MG CAPS capsule Take 0.4 mg by mouth at bedtime. 05/31/19  Yes [provider]  polyethylene glycol-electrolytes (NULYTELY) 420 g solution As directed 04/02/21   Akshar Starnes, Cristopher Estimable, MD    Allergies as of 04/02/2021   (No Known Allergies)    Family History  Problem Relation Age of Onset   Hypertension Mother    Diabetes Mother    Myasthenia gravis Father    Prostate cancer Brother    Colon cancer Neg Hx     Social History   Socioeconomic History   Marital status: Single    Spouse name: Not on file  Number of children: Not on file   Years of education: Not on file   Highest education level: Not on file  Occupational History   Not on file  Tobacco Use   Smoking status: Former    Packs/day: 1.00    Types: Cigarettes    Quit date: 25    Years since quitting: 23.8   Smokeless tobacco: Never  Vaping Use   Vaping Use: Never used  Substance and Sexual Activity   Alcohol use: Not Currently    Comment: rare   Drug use: Never   Sexual activity: Not on file  Other Topics Concern   Not on file  Social History Narrative   Not on file   Social Determinants of Health   Financial Resource Strain: Not on  file  Food Insecurity: Not on file  Transportation Needs: Not on file  Physical Activity: Not on file  Stress: Not on file  Social Connections: Not on file  Intimate Partner Violence: Not on file    Review of Systems: See HPI, otherwise negative ROS  Physical Exam: BP 137/79   Temp 97.8 F (36.6 C) (Oral)   Resp 12   Ht 6\' 5"  (1.956 m)   Wt (!) 148.8 kg   SpO2 96%   BMI 38.90 kg/m  General:   Alert,  Well-developed, well-nourished, pleasant and cooperative in NAD Neck:  Supple; no masses or thyromegaly. No significant cervical adenopathy. Lungs:  Clear throughout to auscultation.   No wheezes, crackles, or rhonchi. No acute distress. Heart:  Regular rate and rhythm; no murmurs, clicks, rubs,  or gallops. Abdomen: Non-distended, normal bowel sounds.  Soft and nontender without appreciable mass or hepatosplenomegaly.  Pulses:  Normal pulses noted. Extremities:  Without clubbing or edema.  Impression/Plan: 60 year old gentleman here for screening colonoscopy.  Based on records obtained, no history of colonic adenoma at last procedure but patient reports polyps prior to that.  Patient is here for a colonoscopy. The risks, benefits, limitations, alternatives and imponderables have been reviewed with the patient. Questions have been answered. All parties are agreeable.     Notice: This dictation was prepared with Dragon dictation along with smaller phrase technology. Any transcriptional errors that result from this process are unintentional and may not be corrected upon review.

## 2021-04-28 NOTE — Anesthesia Postprocedure Evaluation (Signed)
Anesthesia Post Note  Patient: Craig Cole  Procedure(s) Performed: COLONOSCOPY WITH PROPOFOL POLYPECTOMY  Patient location during evaluation: Endoscopy Anesthesia Type: General Level of consciousness: awake and alert Pain management: pain level controlled Vital Signs Assessment: post-procedure vital signs reviewed and stable Respiratory status: spontaneous breathing, nonlabored ventilation, respiratory function stable and patient connected to nasal cannula oxygen Cardiovascular status: blood pressure returned to baseline and stable Postop Assessment: no apparent nausea or vomiting Anesthetic complications: no   No notable events documented.   Last Vitals:  Vitals:   04/28/21 0930 04/28/21 0933  BP: (!) 81/51 (!) 97/56  Pulse: 64   Resp: 18   Temp: 36.6 C   SpO2: 97%     Last Pain:  Vitals:   04/28/21 0933  TempSrc:   PainSc: 0-No pain                 Talitha Givens

## 2021-04-29 LAB — SURGICAL PATHOLOGY

## 2021-04-30 ENCOUNTER — Encounter: Payer: Self-pay | Admitting: Internal Medicine

## 2021-05-01 DIAGNOSIS — R531 Weakness: Secondary | ICD-10-CM | POA: Diagnosis not present

## 2021-05-01 DIAGNOSIS — M2560 Stiffness of unspecified joint, not elsewhere classified: Secondary | ICD-10-CM | POA: Diagnosis not present

## 2021-05-01 DIAGNOSIS — R2689 Other abnormalities of gait and mobility: Secondary | ICD-10-CM | POA: Diagnosis not present

## 2021-05-01 DIAGNOSIS — M545 Low back pain, unspecified: Secondary | ICD-10-CM | POA: Diagnosis not present

## 2021-05-05 ENCOUNTER — Encounter (HOSPITAL_COMMUNITY): Payer: Self-pay | Admitting: Internal Medicine

## 2021-05-08 DIAGNOSIS — M2560 Stiffness of unspecified joint, not elsewhere classified: Secondary | ICD-10-CM | POA: Diagnosis not present

## 2021-05-08 DIAGNOSIS — R531 Weakness: Secondary | ICD-10-CM | POA: Diagnosis not present

## 2021-05-08 DIAGNOSIS — R2689 Other abnormalities of gait and mobility: Secondary | ICD-10-CM | POA: Diagnosis not present

## 2021-05-08 DIAGNOSIS — M545 Low back pain, unspecified: Secondary | ICD-10-CM | POA: Diagnosis not present

## 2021-05-13 DIAGNOSIS — M2560 Stiffness of unspecified joint, not elsewhere classified: Secondary | ICD-10-CM | POA: Diagnosis not present

## 2021-05-13 DIAGNOSIS — M545 Low back pain, unspecified: Secondary | ICD-10-CM | POA: Diagnosis not present

## 2021-05-13 DIAGNOSIS — R531 Weakness: Secondary | ICD-10-CM | POA: Diagnosis not present

## 2021-05-13 DIAGNOSIS — R2689 Other abnormalities of gait and mobility: Secondary | ICD-10-CM | POA: Diagnosis not present

## 2021-05-20 DIAGNOSIS — M2560 Stiffness of unspecified joint, not elsewhere classified: Secondary | ICD-10-CM | POA: Diagnosis not present

## 2021-05-20 DIAGNOSIS — R531 Weakness: Secondary | ICD-10-CM | POA: Diagnosis not present

## 2021-05-20 DIAGNOSIS — R2689 Other abnormalities of gait and mobility: Secondary | ICD-10-CM | POA: Diagnosis not present

## 2021-05-20 DIAGNOSIS — M545 Low back pain, unspecified: Secondary | ICD-10-CM | POA: Diagnosis not present

## 2021-05-25 DIAGNOSIS — G4733 Obstructive sleep apnea (adult) (pediatric): Secondary | ICD-10-CM | POA: Diagnosis not present

## 2021-06-02 DIAGNOSIS — I10 Essential (primary) hypertension: Secondary | ICD-10-CM | POA: Diagnosis not present

## 2021-06-02 DIAGNOSIS — H401111 Primary open-angle glaucoma, right eye, mild stage: Secondary | ICD-10-CM | POA: Diagnosis not present

## 2021-06-02 DIAGNOSIS — F5104 Psychophysiologic insomnia: Secondary | ICD-10-CM | POA: Diagnosis not present

## 2021-06-02 DIAGNOSIS — N401 Enlarged prostate with lower urinary tract symptoms: Secondary | ICD-10-CM | POA: Diagnosis not present

## 2021-06-02 DIAGNOSIS — R600 Localized edema: Secondary | ICD-10-CM | POA: Diagnosis not present

## 2021-06-02 DIAGNOSIS — E669 Obesity, unspecified: Secondary | ICD-10-CM | POA: Diagnosis not present

## 2021-06-02 DIAGNOSIS — E785 Hyperlipidemia, unspecified: Secondary | ICD-10-CM | POA: Diagnosis not present

## 2021-06-02 DIAGNOSIS — K219 Gastro-esophageal reflux disease without esophagitis: Secondary | ICD-10-CM | POA: Diagnosis not present

## 2021-06-02 DIAGNOSIS — H401123 Primary open-angle glaucoma, left eye, severe stage: Secondary | ICD-10-CM | POA: Diagnosis not present

## 2021-06-02 DIAGNOSIS — G4733 Obstructive sleep apnea (adult) (pediatric): Secondary | ICD-10-CM | POA: Diagnosis not present

## 2021-06-10 DIAGNOSIS — I10 Essential (primary) hypertension: Secondary | ICD-10-CM | POA: Diagnosis not present

## 2021-06-11 ENCOUNTER — Other Ambulatory Visit: Payer: Self-pay | Admitting: Pharmacist

## 2021-06-11 DIAGNOSIS — Z79899 Other long term (current) drug therapy: Secondary | ICD-10-CM

## 2021-06-25 DIAGNOSIS — G4733 Obstructive sleep apnea (adult) (pediatric): Secondary | ICD-10-CM | POA: Diagnosis not present

## 2021-06-26 ENCOUNTER — Encounter: Payer: Self-pay | Admitting: Pharmacist

## 2021-06-27 ENCOUNTER — Other Ambulatory Visit: Payer: Self-pay | Admitting: Pharmacist

## 2021-06-27 DIAGNOSIS — Z79899 Other long term (current) drug therapy: Secondary | ICD-10-CM

## 2021-06-27 MED ORDER — DESCOVY 200-25 MG PO TABS: 1.0000 | ORAL_TABLET | Freq: Every day | ORAL | 0 refills | Status: DC

## 2021-06-30 ENCOUNTER — Ambulatory Visit: Payer: BC Managed Care – PPO | Admitting: Pharmacist

## 2021-07-14 ENCOUNTER — Other Ambulatory Visit: Payer: Self-pay | Admitting: Pharmacist

## 2021-07-14 DIAGNOSIS — Z79899 Other long term (current) drug therapy: Secondary | ICD-10-CM

## 2021-07-26 DIAGNOSIS — G4733 Obstructive sleep apnea (adult) (pediatric): Secondary | ICD-10-CM | POA: Diagnosis not present

## 2021-08-06 ENCOUNTER — Ambulatory Visit (INDEPENDENT_AMBULATORY_CARE_PROVIDER_SITE_OTHER): Payer: BC Managed Care – PPO | Admitting: Pharmacist

## 2021-08-06 ENCOUNTER — Other Ambulatory Visit: Payer: Self-pay

## 2021-08-06 ENCOUNTER — Ambulatory Visit (INDEPENDENT_AMBULATORY_CARE_PROVIDER_SITE_OTHER): Payer: BC Managed Care – PPO

## 2021-08-06 DIAGNOSIS — Z113 Encounter for screening for infections with a predominantly sexual mode of transmission: Secondary | ICD-10-CM | POA: Diagnosis not present

## 2021-08-06 DIAGNOSIS — Z961 Presence of intraocular lens: Secondary | ICD-10-CM | POA: Diagnosis not present

## 2021-08-06 DIAGNOSIS — Z23 Encounter for immunization: Secondary | ICD-10-CM | POA: Diagnosis not present

## 2021-08-06 DIAGNOSIS — H401111 Primary open-angle glaucoma, right eye, mild stage: Secondary | ICD-10-CM | POA: Diagnosis not present

## 2021-08-06 DIAGNOSIS — Z79899 Other long term (current) drug therapy: Secondary | ICD-10-CM

## 2021-08-06 DIAGNOSIS — H2511 Age-related nuclear cataract, right eye: Secondary | ICD-10-CM | POA: Diagnosis not present

## 2021-08-06 DIAGNOSIS — H26492 Other secondary cataract, left eye: Secondary | ICD-10-CM | POA: Diagnosis not present

## 2021-08-06 NOTE — Progress Notes (Signed)
Date:  08/06/2021   HPI: Craig Cole is a 61 y.o. male who presents to the [place] clinic for HIV PrEP follow-up.  Insured   [x]    Uninsured  []    Patient Active Problem List   Diagnosis Date Noted   History of colon polyps 03/27/2021   On pre-exposure prophylaxis for HIV 09/10/2020   High risk sexual behavior 09/10/2020    Patient's Medications  New Prescriptions   No medications on file  Previous Medications   ASPIRIN EC 81 MG TABLET    Take 81 mg by mouth every evening.   CHOLECALCIFEROL (VITAMIN D3 PO)    Take 1 tablet by mouth daily.   DORZOLAMIDE-TIMOLOL (COSOPT) 22.3-6.8 MG/ML OPHTHALMIC SOLUTION    Place 1 drop into the left eye 2 (two) times daily.   EMTRICITABINE-TENOFOVIR AF (DESCOVY) 200-25 MG TABLET    Take 1 tablet by mouth daily.   ESOMEPRAZOLE (NEXIUM) 40 MG CAPSULE    Take 40 mg by mouth every morning.   HYDRALAZINE (APRESOLINE) 25 MG TABLET    Take 25 mg by mouth 2 (two) times daily.   IBUPROFEN (ADVIL) 200 MG TABLET    Take 800 mg by mouth every 8 (eight) hours as needed (pain.).   LISINOPRIL (ZESTRIL) 40 MG TABLET    Take 40 mg by mouth in the morning.   MAGNESIUM PO    Take 1 tablet by mouth daily.   MATZIM LA 240 MG 24 HR TABLET    Take 240 mg by mouth in the morning.   MONTELUKAST (SINGULAIR) 10 MG TABLET    Take 10 mg by mouth at bedtime.   MULTIPLE VITAMINS-MINERALS (ZINC PO)    Take 1 tablet by mouth daily.   POLYETHYLENE GLYCOL-ELECTROLYTES (NULYTELY) 420 G SOLUTION    As directed   SIMVASTATIN (ZOCOR) 40 MG TABLET    Take 40 mg by mouth at bedtime.   TADALAFIL (CIALIS) 5 MG TABLET    Take 5 mg by mouth in the morning.   TAFLUPROST, PF, 0.0015 % SOLN    Place 1 drop into both eyes at bedtime.   TAMSULOSIN (FLOMAX) 0.4 MG CAPS CAPSULE    Take 0.4 mg by mouth at bedtime.  Modified Medications   No medications on file  Discontinued Medications   No medications on file    Allergies: No Known Allergies  Past Medical History: Past Medical  History:  Diagnosis Date   Cancer ([place])    skin cancer   GERD (gastroesophageal reflux disease)    Glaucoma    High risk sexual behavior    on PrEP for HIV   Hyperlipidemia    Hypertension    Sleep apnea     Social History: Social History   Socioeconomic History   Marital status: Single    Spouse name: Not on file   Number of children: Not on file   Years of education: Not on file   Highest education level: Not on file  Occupational History   Not on file  Tobacco Use   Smoking status: Former    Packs/day: 1.00    Types: Cigarettes    Quit date: 1999    Years since quitting: 24.1   Smokeless tobacco: Never  Vaping Use   Vaping Use: Never used  Substance and Sexual Activity   Alcohol use: Not Currently    Comment: rare   Drug use: Never   Sexual activity: Not on file  Other Topics Concern   Not on  file  Social History Narrative   Not on file   Social Determinants of Health   Financial Resource Strain: Not on file  Food Insecurity: Not on file  Transportation Needs: Not on file  Physical Activity: Not on file  Stress: Not on file  Social Connections: Not on file    CHL HIV PREP FLOWSHEET RESULTS 07/12/2019  Insurance Status Insured  Gender at birth Male  Gender identity cis-Male  Risk for HIV Condomless vaginal or anal intercourse;Hx of STI  Sex Partners Men only  # sex partners past 3-6 mos 4-6  Sex activity preferences Oral;Receptive  Condom use No  HIV symptoms? N/A    Labs:  SCr: Lab Results  Component Value Date   CREATININE 0.70 (L) 10/30/2020   CREATININE 0.73 07/05/2020   CREATININE 0.72 06/10/2020   CREATININE 0.77 10/17/2019   CREATININE 0.73 07/12/2019   HIV Lab Results  Component Value Date   HIV NON-REACTIVE 03/27/2021   HIV NON-REACTIVE 12/02/2020   HIV NON-REACTIVE 09/10/2020   HIV NON-REACTIVE 06/10/2020   HIV NON-REACTIVE 03/11/2020   Hepatitis B Lab Results  Component Value Date   HEPBSAB NON-REACTIVE 07/12/2019    HEPBSAG NON-REACTIVE 07/12/2019   Hepatitis C Lab Results  Component Value Date   HEPCAB NON-REACTIVE 07/12/2019   Hepatitis A Lab Results  Component Value Date   HAV REACTIVE (A) 07/12/2019   RPR and STI Lab Results  Component Value Date   LABRPR REACTIVE (A) 09/10/2020   LABRPR REACTIVE (A) 03/11/2020   LABRPR REACTIVE (A) 07/12/2019   RPRTITER 1:4 (H) 09/10/2020   RPRTITER 1:4 (H) 03/11/2020   RPRTITER 1:8 (H) 07/12/2019    STI Results GC CT  12/02/2020 Negative Negative  12/02/2020 Negative Negative  12/02/2020 Negative Negative  09/10/2020 Negative Negative  09/10/2020 Negative Positive(A)  09/10/2020 Negative Negative  03/11/2020 Negative Negative  07/12/2019 Negative Negative    Assessment: Craig Cole is here today for HIV PrEP follow up. He continues to take Descovy every day without any missed doses. He tolerates it well without side effects.  No new partners since last visit and no exposures to anything that he is aware of. Screened for acute HIV symptoms such as fatigue, muscle aches, rash, sore throat, lymphadenopathy, headache, night sweats, nausea/vomiting/diarrhea, and fever. Denies any symptoms. Will screen him today for HIV and other STIs. Last kidney check was in May 2022. He qualifies for q16m SCr monitoring (age >52), so will check that today as well. Will refill his Descovy if he remains HIV negative and see him back in 3 months.   He had both MPX vaccines back in August and September and his flu vaccine in October. Will administer the COVID bivalent booster today.  Plan: - HIV antibody, BMP, RPR, urine cytology today - Descovy x 3 months if HIV negative - F/u with me on 5/15 at 145pm  Sandria Mcenroe L. Eber Hong, PharmD, BCIDP, AAHIVP, CPP Clinical Pharmacist Practitioner [place] [facility] 08/06/2021, 2:00 PM

## 2021-08-06 NOTE — Progress Notes (Signed)
° °  Covid-19 Vaccination Clinic  Name:  Craig Cole    MRN: [id number] DOB: 1961/03/22  08/06/2021  [name] was observed post Covid-19 immunization for 15 minutes without incident. He was provided with Vaccine Information Sheet and instruction to access the V-Safe system.   [name] was instructed to call 911 with any severe reactions post vaccine: Difficulty breathing  Swelling of face and throat  A fast heartbeat  A bad rash all over body  Dizziness and weakness   Immunizations Administered     Name Date Dose VIS Date Route   Pfizer Covid-19 Vaccine Bivalent Booster 08/06/2021  2:21 PM 0.3 mL 02/19/2021 Intramuscular   Manufacturer: [place]   Lot: [id number]   [place]: [place]. Yocelyn Brocious, PharmD, BCIDP, AAHIVP, CPP Clinical Pharmacist Practitioner [place] [facility] 08/06/2021, 2:24 PM

## 2021-08-07 ENCOUNTER — Other Ambulatory Visit: Payer: Self-pay | Admitting: Pharmacist

## 2021-08-07 DIAGNOSIS — Z79899 Other long term (current) drug therapy: Secondary | ICD-10-CM

## 2021-08-07 MED ORDER — DESCOVY 200-25 MG PO TABS: 1.0000 | ORAL_TABLET | Freq: Every day | ORAL | 2 refills | Status: DC

## 2021-08-08 LAB — BASIC METABOLIC PANEL
BUN: 7 mg/dL (ref 7–25)
CO2: 27 mmol/L (ref 20–32)
Calcium: 9.4 mg/dL (ref 8.6–10.3)
Chloride: 107 mmol/L (ref 98–110)
Creat: 0.7 mg/dL (ref 0.70–1.35)
Glucose, Bld: 100 mg/dL — ABNORMAL HIGH (ref 65–99)
Potassium: 4 mmol/L (ref 3.5–5.3)
Sodium: 141 mmol/L (ref 135–146)

## 2021-08-08 LAB — RPR TITER: RPR Titer: 1:4 {titer} — ABNORMAL HIGH

## 2021-08-08 LAB — FLUORESCENT TREPONEMAL AB(FTA)-IGG-BLD: Fluorescent Treponemal ABS: REACTIVE — AB

## 2021-08-08 LAB — HIV ANTIBODY (ROUTINE TESTING W REFLEX): HIV 1&2 Ab, 4th Generation: NONREACTIVE

## 2021-08-08 LAB — C. TRACHOMATIS/N. GONORRHOEAE RNA
C. trachomatis RNA, TMA: NOT DETECTED
N. gonorrhoeae RNA, TMA: NOT DETECTED

## 2021-08-08 LAB — RPR: RPR Ser Ql: REACTIVE — AB

## 2021-08-15 ENCOUNTER — Encounter: Payer: Self-pay | Admitting: Pharmacist

## 2021-10-01 DIAGNOSIS — K219 Gastro-esophageal reflux disease without esophagitis: Secondary | ICD-10-CM | POA: Diagnosis not present

## 2021-10-01 DIAGNOSIS — H26492 Other secondary cataract, left eye: Secondary | ICD-10-CM | POA: Diagnosis not present

## 2021-10-01 DIAGNOSIS — Z0001 Encounter for general adult medical examination with abnormal findings: Secondary | ICD-10-CM | POA: Diagnosis not present

## 2021-10-01 DIAGNOSIS — I10 Essential (primary) hypertension: Secondary | ICD-10-CM | POA: Diagnosis not present

## 2021-10-01 DIAGNOSIS — E785 Hyperlipidemia, unspecified: Secondary | ICD-10-CM | POA: Diagnosis not present

## 2021-10-10 DIAGNOSIS — H2511 Age-related nuclear cataract, right eye: Secondary | ICD-10-CM | POA: Diagnosis not present

## 2021-10-27 ENCOUNTER — Other Ambulatory Visit: Payer: Self-pay | Admitting: Pharmacist

## 2021-10-27 DIAGNOSIS — Z79899 Other long term (current) drug therapy: Secondary | ICD-10-CM

## 2021-10-29 NOTE — Telephone Encounter (Signed)
No more refills until patient follows up with clinic for appointment. ?

## 2021-10-31 ENCOUNTER — Ambulatory Visit: Payer: BC Managed Care – PPO | Admitting: Pharmacist

## 2021-11-03 ENCOUNTER — Ambulatory Visit: Payer: BC Managed Care – PPO | Admitting: Pharmacist

## 2021-12-08 ENCOUNTER — Ambulatory Visit (INDEPENDENT_AMBULATORY_CARE_PROVIDER_SITE_OTHER): Payer: BC Managed Care – PPO | Admitting: Pharmacist

## 2021-12-08 ENCOUNTER — Other Ambulatory Visit: Payer: Self-pay

## 2021-12-08 DIAGNOSIS — Z79899 Other long term (current) drug therapy: Secondary | ICD-10-CM | POA: Diagnosis not present

## 2021-12-08 NOTE — Progress Notes (Signed)
Date:  12/08/2021   HPI: Craig Cole is a 61 y.o. male who presents to the [place] clinic for HIV PrEP follow-up.  Insured   '[x]'$    Uninsured  '[]'$    Patient Active Problem List   Diagnosis Date Noted   History of colon polyps 03/27/2021   On pre-exposure prophylaxis for HIV 09/10/2020   High risk sexual behavior 09/10/2020    Patient's Medications  New Prescriptions   No medications on file  Previous Medications   ASPIRIN EC 81 MG TABLET    Take 81 mg by mouth every evening.   CHOLECALCIFEROL (VITAMIN D3 PO)    Take 1 tablet by mouth daily.   DESCOVY 200-25 MG TABLET    TAKE ONE TABLET BY MOUTH ONCE DAILY WITH OR WITHOUT FOOD.   DORZOLAMIDE-TIMOLOL (COSOPT) 22.3-6.8 MG/ML OPHTHALMIC SOLUTION    Place 1 drop into the left eye 2 (two) times daily.   ESOMEPRAZOLE (NEXIUM) 40 MG CAPSULE    Take 40 mg by mouth every morning.   FUROSEMIDE (LASIX) 20 MG TABLET    TAKE 1 TABLET BY MOUTH DAILY AS NEEDED FOR LOWER LEG SWELLING   HYDRALAZINE (APRESOLINE) 25 MG TABLET    Take 25 mg by mouth 2 (two) times daily.   IBUPROFEN (ADVIL) 200 MG TABLET    Take 800 mg by mouth every 8 (eight) hours as needed (pain.).   LISINOPRIL (ZESTRIL) 40 MG TABLET    Take 40 mg by mouth in the morning.   MAGNESIUM PO    Take 1 tablet by mouth daily.   MATZIM LA 240 MG 24 HR TABLET    Take 240 mg by mouth in the morning.   MONTELUKAST (SINGULAIR) 10 MG TABLET    Take 10 mg by mouth at bedtime.   MULTIPLE VITAMINS-MINERALS (ZINC PO)    Take 1 tablet by mouth daily.   POLYETHYLENE GLYCOL-ELECTROLYTES (NULYTELY) 420 G SOLUTION    As directed   SIMVASTATIN (ZOCOR) 40 MG TABLET    Take 40 mg by mouth at bedtime.   TADALAFIL (CIALIS) 5 MG TABLET    Take 5 mg by mouth in the morning.   TAFLUPROST, PF, 0.0015 % SOLN    Place 1 drop into both eyes at bedtime.   TAMSULOSIN (FLOMAX) 0.4 MG CAPS CAPSULE    Take 0.4 mg by mouth at bedtime.  Modified Medications   No medications on file  Discontinued Medications   No  medications on file    Allergies: No Known Allergies  Past Medical History: Past Medical History:  Diagnosis Date   Cancer ([place])    skin cancer   GERD (gastroesophageal reflux disease)    Glaucoma    High risk sexual behavior    on PrEP for HIV   Hyperlipidemia    Hypertension    Sleep apnea     Social History: Social History   Socioeconomic History   Marital status: Single    Spouse name: Not on file   Number of children: Not on file   Years of education: Not on file   Highest education level: Not on file  Occupational History   Not on file  Tobacco Use   Smoking status: Former    Packs/day: 1.00    Types: Cigarettes    Quit date: 1999    Years since quitting: 24.4   Smokeless tobacco: Never  Vaping Use   Vaping Use: Never used  Substance and Sexual Activity   Alcohol use: Not Currently  Comment: rare   Drug use: Never   Sexual activity: Not on file  Other Topics Concern   Not on file  Social History Narrative   Not on file   Social Determinants of Health   Financial Resource Strain: Not on file  Food Insecurity: Not on file  Transportation Needs: Not on file  Physical Activity: Not on file  Stress: Not on file  Social Connections: Not on file       07/12/2019   10:25 AM  CHL HIV PREP FLOWSHEET RESULTS  Insurance Status Insured  Gender at birth Male  Gender identity cis-Male  Risk for HIV Condomless vaginal or anal intercourse;Hx of STI  Sex Partners Men only  # sex partners past 3-6 mos 4-6  Sex activity preferences Oral;Receptive  Condom use No  HIV symptoms? N/A    Labs:  SCr: Lab Results  Component Value Date   CREATININE 0.70 08/06/2021   CREATININE 0.70 (L) 10/30/2020   CREATININE 0.73 07/05/2020   CREATININE 0.72 06/10/2020   CREATININE 0.77 10/17/2019   HIV Lab Results  Component Value Date   HIV NON-REACTIVE 08/06/2021   HIV NON-REACTIVE 03/27/2021   HIV NON-REACTIVE 12/02/2020   HIV NON-REACTIVE 09/10/2020   HIV  NON-REACTIVE 06/10/2020   Hepatitis B Lab Results  Component Value Date   HEPBSAB NON-REACTIVE 07/12/2019   HEPBSAG NON-REACTIVE 07/12/2019   Hepatitis C Lab Results  Component Value Date   HEPCAB NON-REACTIVE 07/12/2019   Hepatitis A Lab Results  Component Value Date   HAV REACTIVE (A) 07/12/2019   RPR and STI Lab Results  Component Value Date   LABRPR REACTIVE (A) 08/06/2021   LABRPR REACTIVE (A) 09/10/2020   LABRPR REACTIVE (A) 03/11/2020   LABRPR REACTIVE (A) 07/12/2019   RPRTITER 1:4 (H) 08/06/2021   RPRTITER 1:4 (H) 09/10/2020   RPRTITER 1:4 (H) 03/11/2020   RPRTITER 1:8 (H) 07/12/2019    STI Results GC CT  12/02/2020 11:59 AM Negative    Negative    Negative  Negative    Negative    Negative   09/10/2020  9:56 AM Negative    Negative    Negative  Negative    Positive    Negative   03/11/2020 11:45 AM Negative  Negative   07/12/2019 11:02 AM Negative  Negative     Assessment: Craig Cole is here today for his 3 month follow up for PrEP. He is doing well on Descovy and having no issues with tolerability or access to the medication. He has about 3 weeks remaining of his current fill. No new partners since last visit and politely declines STI testing today. Screened for acute HIV symptoms such as fatigue, muscle aches, rash, sore throat, lymphadenopathy, headache, night sweats, nausea/vomiting/diarrhea, and fever. Denies any symptoms. Will check a HIV antibody today and send in a 90 day supply if he remains negative. Of note, he was vaccinated for Hepatitis B a few years ago. Will check for immunity today.    Plan: - HIV antibody and Hepatitis B surf ab today - Descovy x 3 months if HIV negative - F/u with me on 9/18 at 145pm  Ameisha Mcclellan L. Eber Hong, PharmD, BCIDP, AAHIVP, CPP Clinical Pharmacist Practitioner [place] [facility] 12/08/2021, 2:00 PM

## 2021-12-09 ENCOUNTER — Other Ambulatory Visit: Payer: Self-pay | Admitting: Pharmacist

## 2021-12-09 DIAGNOSIS — Z79899 Other long term (current) drug therapy: Secondary | ICD-10-CM

## 2021-12-09 LAB — HEPATITIS B SURFACE ANTIBODY,QUALITATIVE: Hep B S Ab: NONREACTIVE

## 2021-12-09 LAB — HIV ANTIBODY (ROUTINE TESTING W REFLEX): HIV 1&2 Ab, 4th Generation: NONREACTIVE

## 2021-12-09 MED ORDER — DESCOVY 200-25 MG PO TABS: 1.0000 | ORAL_TABLET | Freq: Every day | ORAL | 0 refills | Status: DC

## 2021-12-25 DIAGNOSIS — G4730 Sleep apnea, unspecified: Secondary | ICD-10-CM | POA: Diagnosis not present

## 2021-12-25 DIAGNOSIS — Z87891 Personal history of nicotine dependence: Secondary | ICD-10-CM | POA: Diagnosis not present

## 2021-12-25 DIAGNOSIS — H25811 Combined forms of age-related cataract, right eye: Secondary | ICD-10-CM | POA: Diagnosis not present

## 2021-12-26 DIAGNOSIS — Z85820 Personal history of malignant melanoma of skin: Secondary | ICD-10-CM | POA: Diagnosis not present

## 2021-12-26 DIAGNOSIS — C44719 Basal cell carcinoma of skin of left lower limb, including hip: Secondary | ICD-10-CM | POA: Diagnosis not present

## 2021-12-26 DIAGNOSIS — Z1283 Encounter for screening for malignant neoplasm of skin: Secondary | ICD-10-CM | POA: Diagnosis not present

## 2021-12-26 DIAGNOSIS — Z08 Encounter for follow-up examination after completed treatment for malignant neoplasm: Secondary | ICD-10-CM | POA: Diagnosis not present

## 2021-12-26 DIAGNOSIS — C44712 Basal cell carcinoma of skin of right lower limb, including hip: Secondary | ICD-10-CM | POA: Diagnosis not present

## 2021-12-26 DIAGNOSIS — D225 Melanocytic nevi of trunk: Secondary | ICD-10-CM | POA: Diagnosis not present

## 2022-02-19 ENCOUNTER — Other Ambulatory Visit: Payer: Self-pay | Admitting: Pharmacist

## 2022-02-19 DIAGNOSIS — Z79899 Other long term (current) drug therapy: Secondary | ICD-10-CM

## 2022-02-24 ENCOUNTER — Other Ambulatory Visit: Payer: Self-pay | Admitting: Physician Assistant

## 2022-02-24 DIAGNOSIS — M545 Low back pain, unspecified: Secondary | ICD-10-CM

## 2022-03-09 ENCOUNTER — Ambulatory Visit: Payer: BC Managed Care – PPO | Admitting: Pharmacist

## 2022-03-09 ENCOUNTER — Ambulatory Visit (INDEPENDENT_AMBULATORY_CARE_PROVIDER_SITE_OTHER): Payer: BC Managed Care – PPO | Admitting: Pharmacist

## 2022-03-09 ENCOUNTER — Other Ambulatory Visit: Payer: Self-pay

## 2022-03-09 DIAGNOSIS — Z79899 Other long term (current) drug therapy: Secondary | ICD-10-CM | POA: Diagnosis not present

## 2022-03-09 DIAGNOSIS — Z08 Encounter for follow-up examination after completed treatment for malignant neoplasm: Secondary | ICD-10-CM | POA: Diagnosis not present

## 2022-03-09 DIAGNOSIS — Z85828 Personal history of other malignant neoplasm of skin: Secondary | ICD-10-CM | POA: Diagnosis not present

## 2022-03-09 MED ORDER — DESCOVY 200-25 MG PO TABS: 1.0000 | ORAL_TABLET | Freq: Every day | ORAL | 0 refills | Status: DC

## 2022-03-09 NOTE — Progress Notes (Signed)
Date:  03/09/2022   HPI: Craig Cole is a 61 y.o. male who presents to the [place] clinic for HIV PrEP follow-up.  Insured   '[x]'$    Uninsured  '[]'$    Patient Active Problem List   Diagnosis Date Noted   History of colon polyps 03/27/2021   On pre-exposure prophylaxis for HIV 09/10/2020   High risk sexual behavior 09/10/2020    Patient's Medications  New Prescriptions   No medications on file  Previous Medications   ASPIRIN EC 81 MG TABLET    Take 81 mg by mouth every evening.   CHOLECALCIFEROL (VITAMIN D3 PO)    Take 1 tablet by mouth daily.   DORZOLAMIDE-TIMOLOL (COSOPT) 22.3-6.8 MG/ML OPHTHALMIC SOLUTION    Place 1 drop into the left eye 2 (two) times daily.   EMTRICITABINE-TENOFOVIR AF (DESCOVY) 200-25 MG TABLET    Take 1 tablet by mouth daily.   ESOMEPRAZOLE (NEXIUM) 40 MG CAPSULE    Take 40 mg by mouth every morning.   FUROSEMIDE (LASIX) 20 MG TABLET    TAKE 1 TABLET BY MOUTH DAILY AS NEEDED FOR LOWER LEG SWELLING   HYDRALAZINE (APRESOLINE) 25 MG TABLET    Take 25 mg by mouth 2 (two) times daily.   IBUPROFEN (ADVIL) 200 MG TABLET    Take 800 mg by mouth every 8 (eight) hours as needed (pain.).   LISINOPRIL (ZESTRIL) 40 MG TABLET    Take 40 mg by mouth in the morning.   MAGNESIUM PO    Take 1 tablet by mouth daily.   MATZIM LA 240 MG 24 HR TABLET    Take 240 mg by mouth in the morning.   MONTELUKAST (SINGULAIR) 10 MG TABLET    Take 10 mg by mouth at bedtime.   MULTIPLE VITAMINS-MINERALS (ZINC PO)    Take 1 tablet by mouth daily.   POLYETHYLENE GLYCOL-ELECTROLYTES (NULYTELY) 420 G SOLUTION    As directed   SIMVASTATIN (ZOCOR) 40 MG TABLET    Take 40 mg by mouth at bedtime.   TADALAFIL (CIALIS) 5 MG TABLET    Take 5 mg by mouth in the morning.   TAFLUPROST, PF, 0.0015 % SOLN    Place 1 drop into both eyes at bedtime.   TAMSULOSIN (FLOMAX) 0.4 MG CAPS CAPSULE    Take 0.4 mg by mouth at bedtime.  Modified Medications   No medications on file  Discontinued Medications    No medications on file    Allergies: No Known Allergies  Past Medical History: Past Medical History:  Diagnosis Date   Cancer ([place])    skin cancer   GERD (gastroesophageal reflux disease)    Glaucoma    High risk sexual behavior    on PrEP for HIV   Hyperlipidemia    Hypertension    Sleep apnea     Social History: Social History   Socioeconomic History   Marital status: Single    Spouse name: Not on file   Number of children: Not on file   Years of education: Not on file   Highest education level: Not on file  Occupational History   Not on file  Tobacco Use   Smoking status: Former    Packs/day: 1.00    Types: Cigarettes    Quit date: 1999    Years since quitting: 24.7   Smokeless tobacco: Never  Vaping Use   Vaping Use: Never used  Substance and Sexual Activity   Alcohol use: Not Currently  Comment: rare   Drug use: Never   Sexual activity: Not on file  Other Topics Concern   Not on file  Social History Narrative   Not on file   Social Determinants of Health   Financial Resource Strain: Not on file  Food Insecurity: Not on file  Transportation Needs: Not on file  Physical Activity: Not on file  Stress: Not on file  Social Connections: Not on file       07/12/2019   10:25 AM  CHL HIV PREP FLOWSHEET RESULTS  Insurance Status Insured  Gender at birth Male  Gender identity cis-Male  Risk for HIV Condomless vaginal or anal intercourse;Hx of STI  Sex Partners Men only  # sex partners past 3-6 mos 4-6  Sex activity preferences Oral;Receptive  Condom use No  HIV symptoms? N/A    Labs:  SCr: Lab Results  Component Value Date   CREATININE 0.70 08/06/2021   CREATININE 0.70 (L) 10/30/2020   CREATININE 0.73 07/05/2020   CREATININE 0.72 06/10/2020   CREATININE 0.77 10/17/2019   HIV Lab Results  Component Value Date   HIV NON-REACTIVE 12/08/2021   HIV NON-REACTIVE 08/06/2021   HIV NON-REACTIVE 03/27/2021   HIV NON-REACTIVE 12/02/2020    HIV NON-REACTIVE 09/10/2020   Hepatitis B Lab Results  Component Value Date   HEPBSAB NON-REACTIVE 12/08/2021   HEPBSAG NON-REACTIVE 07/12/2019   Hepatitis C Lab Results  Component Value Date   HEPCAB NON-REACTIVE 07/12/2019   Hepatitis A Lab Results  Component Value Date   HAV REACTIVE (A) 07/12/2019   RPR and STI Lab Results  Component Value Date   LABRPR REACTIVE (A) 08/06/2021   LABRPR REACTIVE (A) 09/10/2020   LABRPR REACTIVE (A) 03/11/2020   LABRPR REACTIVE (A) 07/12/2019   RPRTITER 1:4 (H) 08/06/2021   RPRTITER 1:4 (H) 09/10/2020   RPRTITER 1:4 (H) 03/11/2020   RPRTITER 1:8 (H) 07/12/2019    STI Results GC CT  12/02/2020 11:59 AM Negative    Negative    Negative  Negative    Negative    Negative   09/10/2020  9:56 AM Negative    Negative    Negative  Negative    Positive    Negative   03/11/2020 11:45 AM Negative  Negative   07/12/2019 11:02 AM Negative  Negative     Assessment: Craig Cole is here for his HIV PrEP follow up appointment. Continues to do well on Descovy with no problems or missed doses. He is in need of a refill. Declines STI testing today. Will check a HIV antibody and see him back in 3 months.   Plan: - HIV antibody today - Descovy x 3 months if HIV negative - F/u on 12/22 with [name]. Craig Cole, PharmD, BCIDP, AAHIVP, CPP Clinical Pharmacist Practitioner Infectious Diseases [place] [facility] 03/09/2022, 9:55 AM

## 2022-03-10 ENCOUNTER — Other Ambulatory Visit: Payer: Self-pay | Admitting: Pharmacist

## 2022-03-10 DIAGNOSIS — Z79899 Other long term (current) drug therapy: Secondary | ICD-10-CM

## 2022-03-10 LAB — HIV ANTIBODY (ROUTINE TESTING W REFLEX): HIV 1&2 Ab, 4th Generation: NONREACTIVE

## 2022-04-14 DIAGNOSIS — Z23 Encounter for immunization: Secondary | ICD-10-CM | POA: Diagnosis not present

## 2022-04-14 DIAGNOSIS — K219 Gastro-esophageal reflux disease without esophagitis: Secondary | ICD-10-CM | POA: Diagnosis not present

## 2022-04-14 DIAGNOSIS — I10 Essential (primary) hypertension: Secondary | ICD-10-CM | POA: Diagnosis not present

## 2022-06-01 ENCOUNTER — Other Ambulatory Visit: Payer: Self-pay | Admitting: Pharmacist

## 2022-06-01 DIAGNOSIS — Z79899 Other long term (current) drug therapy: Secondary | ICD-10-CM

## 2022-06-08 ENCOUNTER — Other Ambulatory Visit: Payer: Self-pay

## 2022-06-08 ENCOUNTER — Ambulatory Visit (INDEPENDENT_AMBULATORY_CARE_PROVIDER_SITE_OTHER): Payer: BC Managed Care – PPO

## 2022-06-08 ENCOUNTER — Ambulatory Visit (INDEPENDENT_AMBULATORY_CARE_PROVIDER_SITE_OTHER): Payer: BC Managed Care – PPO | Admitting: Pharmacist

## 2022-06-08 DIAGNOSIS — Z23 Encounter for immunization: Secondary | ICD-10-CM | POA: Diagnosis not present

## 2022-06-08 DIAGNOSIS — G4733 Obstructive sleep apnea (adult) (pediatric): Secondary | ICD-10-CM | POA: Diagnosis not present

## 2022-06-08 DIAGNOSIS — N400 Enlarged prostate without lower urinary tract symptoms: Secondary | ICD-10-CM | POA: Diagnosis not present

## 2022-06-08 DIAGNOSIS — Z79899 Other long term (current) drug therapy: Secondary | ICD-10-CM | POA: Diagnosis not present

## 2022-06-08 DIAGNOSIS — R5383 Other fatigue: Secondary | ICD-10-CM | POA: Diagnosis not present

## 2022-06-08 DIAGNOSIS — I10 Essential (primary) hypertension: Secondary | ICD-10-CM | POA: Diagnosis not present

## 2022-06-08 MED ORDER — DESCOVY 200-25 MG PO TABS: 1.0000 | ORAL_TABLET | Freq: Every day | ORAL | 0 refills | Status: DC

## 2022-06-08 NOTE — Progress Notes (Signed)
Date:  06/08/2022   HPI: Craig Cole is a 61 y.o. male who presents to the [facility] for HIV PrEP follow-up.  Insured   '[x]'$    Uninsured  '[]'$    Patient Active Problem List   Diagnosis Date Noted   History of colon polyps 03/27/2021   On pre-exposure prophylaxis for HIV 09/10/2020   High risk sexual behavior 09/10/2020    Patient's Medications  New Prescriptions   No medications on file  Previous Medications   ASPIRIN EC 81 MG TABLET    Take 81 mg by mouth every evening.   CHOLECALCIFEROL (VITAMIN D3 PO)    Take 1 tablet by mouth daily.   DORZOLAMIDE-TIMOLOL (COSOPT) 22.3-6.8 MG/ML OPHTHALMIC SOLUTION    Place 1 drop into the left eye 2 (two) times daily.   EMTRICITABINE-TENOFOVIR AF (DESCOVY) 200-25 MG TABLET    Take 1 tablet by mouth daily.   ESOMEPRAZOLE (NEXIUM) 40 MG CAPSULE    Take 40 mg by mouth every morning.   FUROSEMIDE (LASIX) 20 MG TABLET    TAKE 1 TABLET BY MOUTH DAILY AS NEEDED FOR LOWER LEG SWELLING   HYDRALAZINE (APRESOLINE) 25 MG TABLET    Take 25 mg by mouth 2 (two) times daily.   IBUPROFEN (ADVIL) 200 MG TABLET    Take 800 mg by mouth every 8 (eight) hours as needed (pain.).   LISINOPRIL (ZESTRIL) 40 MG TABLET    Take 40 mg by mouth in the morning.   MAGNESIUM PO    Take 1 tablet by mouth daily.   MATZIM LA 240 MG 24 HR TABLET    Take 240 mg by mouth in the morning.   MONTELUKAST (SINGULAIR) 10 MG TABLET    Take 10 mg by mouth at bedtime.   MULTIPLE VITAMINS-MINERALS (ZINC PO)    Take 1 tablet by mouth daily.   POLYETHYLENE GLYCOL-ELECTROLYTES (NULYTELY) 420 G SOLUTION    As directed   SIMVASTATIN (ZOCOR) 40 MG TABLET    Take 40 mg by mouth at bedtime.   TADALAFIL (CIALIS) 5 MG TABLET    Take 5 mg by mouth in the morning.   TAFLUPROST, PF, 0.0015 % SOLN    Place 1 drop into both eyes at bedtime.   TAMSULOSIN (FLOMAX) 0.4 MG CAPS CAPSULE    Take 0.4 mg by mouth at bedtime.  Modified Medications   No medications on file  Discontinued Medications    No medications on file    Allergies: No Known Allergies  Past Medical History: Past Medical History:  Diagnosis Date   Cancer ([place])    skin cancer   GERD (gastroesophageal reflux disease)    Glaucoma    High risk sexual behavior    on PrEP for HIV   Hyperlipidemia    Hypertension    Sleep apnea     Social History: Social History   Socioeconomic History   Marital status: Single    Spouse name: Not on file   Number of children: Not on file   Years of education: Not on file   Highest education level: Not on file  Occupational History   Not on file  Tobacco Use   Smoking status: Former    Packs/day: 1.00    Types: Cigarettes    Quit date: 1999    Years since quitting: 24.9   Smokeless tobacco: Never  Vaping Use   Vaping Use: Never used  Substance and Sexual Activity   Alcohol use: Not Currently  Comment: rare   Drug use: Never   Sexual activity: Not on file  Other Topics Concern   Not on file  Social History Narrative   Not on file   Social Determinants of Health   Financial Resource Strain: Not on file  Food Insecurity: Not on file  Transportation Needs: Not on file  Physical Activity: Not on file  Stress: Not on file  Social Connections: Not on file       07/12/2019   10:25 AM  CH[facility] HIV PREP FLOWSHEET RESULTS  Insurance Status Insured  Gender at birth Male  Gender identity cis-Male  Risk for HIV Condomless vaginal or anal intercourse;Hx of STI  Sex Partners Men only  # sex partners past 3-6 mos 4-6  Sex activity preferences Oral;Receptive  Condom use No  HIV symptoms? N/A    Labs:  SCr: Lab Results  Component Value Date   CREATININE 0.70 08/06/2021   CREATININE 0.70 (L) 10/30/2020   CREATININE 0.73 07/05/2020   CREATININE 0.72 06/10/2020   CREATININE 0.77 10/17/2019   HIV Lab Results  Component Value Date   HIV NON-REACTIVE 03/09/2022   HIV NON-REACTIVE 12/08/2021   HIV NON-REACTIVE 08/06/2021   HIV NON-REACTIVE 03/27/2021    HIV NON-REACTIVE 12/02/2020   Hepatitis B Lab Results  Component Value Date   HEPBSAB NON-REACTIVE 12/08/2021   HEPBSAG NON-REACTIVE 07/12/2019   Hepatitis C Lab Results  Component Value Date   HEPCAB NON-REACTIVE 07/12/2019   Hepatitis A Lab Results  Component Value Date   HAV REACTIVE (A) 07/12/2019   RPR and STI Lab Results  Component Value Date   LABRPR REACTIVE (A) 08/06/2021   LABRPR REACTIVE (A) 09/10/2020   LABRPR REACTIVE (A) 03/11/2020   LABRPR REACTIVE (A) 07/12/2019   RPRTITER 1:4 (H) 08/06/2021   RPRTITER 1:4 (H) 09/10/2020   RPRTITER 1:4 (H) 03/11/2020   RPRTITER 1:8 (H) 07/12/2019    STI Results GC CT  12/02/2020 11:59 AM Negative    Negative    Negative  Negative    Negative    Negative   09/10/2020  9:56 AM Negative    Negative    Negative  Negative    Positive    Negative   03/11/2020 11:45 AM Negative  Negative   07/12/2019 11:02 AM Negative  Negative     Assessment: Craig Cole is here for PrEP follow up. Doing well on Descovy. No issues or side effects. He gets a 90 day supply from [facility]. Screened for acute HIV symptoms such as fatigue, muscle aches, rash, sore throat, lymphadenopathy, headache, night sweats, nausea/vomiting/diarrhea, and fever. Denies any symptoms.  Politely declines STI testing today. Will check HIV status and refill his Descovy for 3 more months.   He received his COVID vaccine today and has already received his flu vaccine from his PCP.  Plan: - HIV antibody today - COVID vaccine today - Descovy x 3 months if HIV negative - F/u with me in March  Craig Cole L. Eber Hong, PharmD, BCIDP, AAHIVP, CPP Clinical Pharmacist Practitioner Infectious Diseases [place] 06/08/2022, 10:11 AM

## 2022-06-09 ENCOUNTER — Telehealth: Payer: Self-pay

## 2022-06-09 LAB — HIV ANTIBODY (ROUTINE TESTING W REFLEX): HIV 1&2 Ab, 4th Generation: NONREACTIVE

## 2022-06-09 NOTE — Telephone Encounter (Signed)
Received TOC records from [facility] for sleep lab. Placed in sleep lab mailbox

## 2022-06-12 ENCOUNTER — Ambulatory Visit: Payer: BC Managed Care – PPO | Admitting: Pharmacist

## 2022-06-19 ENCOUNTER — Ambulatory Visit
Admission: EM | Admit: 2022-06-19 | Discharge: 2022-06-19 | Disposition: A | Discharge status: Home / Self Care | Payer: BC Managed Care – PPO | Attending: Family Medicine | Admitting: Family Medicine

## 2022-06-19 DIAGNOSIS — U071 COVID-19: Secondary | ICD-10-CM

## 2022-06-19 MED ORDER — PROMETHAZINE-DM 6.25-15 MG/5ML PO SYRP: 5.0000 mL | ORAL_SOLUTION | Freq: Four times a day (QID) | ORAL | 0 refills | Status: DC | PRN

## 2022-06-19 MED ORDER — NIRMATRELVIR/RITONAVIR (PAXLOVID)TABLET: 3.0000 | ORAL_TABLET | Freq: Two times a day (BID) | ORAL | 0 refills | Status: AC

## 2022-06-19 NOTE — ED Triage Notes (Signed)
Pt reports he tested positive for covid x 1 day.  He has some aches and pains in his body, warm feeling, and stomach pain. Took ibuprofen but no relief.

## 2022-06-19 NOTE — ED Provider Notes (Signed)
[facility]    CSN: [id number] Arrival date & time: 06/19/22  1117      History   Chief Complaint No chief complaint on file.   HPI Craig Cole is a 61 y.o. male.   Presenting today with 1 day history of congestion, body aches, headache, sweats, abdominal pain.  Denies chest pain, shortness of breath, nausea vomiting or diarrhea.  Taking ibuprofen with mild temporary benefit.  Tested positive for COVID at home.    Past Medical History:  Diagnosis Date   Cancer [facility])    skin cancer   GERD (gastroesophageal reflux disease)    Glaucoma    High risk sexual behavior    on PrEP for HIV   Hyperlipidemia    Hypertension    Sleep apnea     Patient Active Problem List   Diagnosis Date Noted   History of colon polyps 03/27/2021   On pre-exposure prophylaxis for HIV 09/10/2020   High risk sexual behavior 09/10/2020    Past Surgical History:  Procedure Laterality Date   COLONOSCOPY  05/25/2012   [facility]; rare diverticulum.   COLONOSCOPY WITH PROPOFOL N/A 04/28/2021   Procedure: COLONOSCOPY WITH PROPOFOL;  Surgeon: Daneil Dolin, MD;  [facility];  Service: Endoscopy;  Laterality: N/A;  8:45am   POLYPECTOMY  04/28/2021   Procedure: POLYPECTOMY;  Surgeon: Daneil Dolin, MD;  [facility];  Service: Endoscopy;;   RETINAL DETACHMENT SURGERY Left    TONSILLECTOMY         Home Medications    Prior to Admission medications   Medication Sig Start Date End Date Taking? Authorizing Provider  nirmatrelvir/ritonavir (PAXLOVID) 20 x 150 MG & 10 x '100MG'$  TABS Take 3 tablets by mouth 2 (two) times daily for 5 days. Patient GFR is >60.Take nirmatrelvir (150 mg) two tablets twice daily for 5 days and ritonavir (100 mg) one tablet twice daily for 5 days. 06/19/22 06/24/22 Yes Volney American, PA-C  promethazine-dextromethorphan (PROMETHAZINE-DM) 6.25-15 MG/5ML syrup Take 5 mLs by mouth 4 (four) times daily  as needed. 06/19/22  Yes Volney American, PA-C  aspirin EC 81 MG tablet Take 81 mg by mouth every evening.    [provider]  Cholecalciferol (VITAMIN D3 PO) Take 1 tablet by mouth daily.    [provider]  dorzolamide-timolol (COSOPT) 22.3-6.8 MG/ML ophthalmic solution Place 1 drop into the left eye 2 (two) times daily.    [provider]  emtricitabine-tenofovir AF (DESCOVY) 200-25 MG tablet Take 1 tablet by mouth daily. 06/08/22   Kuppelweiser, Cassie L, RPH-CPP  esomeprazole (NEXIUM) 40 MG capsule Take 40 mg by mouth every morning. 05/31/19   [provider]  furosemide (LASIX) 20 MG tablet TAKE 1 TABLET BY MOUTH DAILY AS NEEDED FOR LOWER LEG SWELLING 06/02/21   [provider]  hydrALAZINE (APRESOLINE) 25 MG tablet Take 25 mg by mouth 2 (two) times daily. 01/27/21   [provider]  ibuprofen (ADVIL) 200 MG tablet Take 800 mg by mouth every 8 (eight) hours as needed (pain.).    [provider]  lisinopril (ZESTRIL) 40 MG tablet Take 40 mg by mouth in the morning. 06/27/20   [provider]  MAGNESIUM PO Take 1 tablet by mouth daily.    [provider]  MATZIM LA 240 MG 24 hr tablet Take 240 mg by mouth in the morning. 02/25/21   [provider]  montelukast (SINGULAIR) 10 MG tablet Take  10 mg by mouth at bedtime. 05/31/19   [provider]  Multiple Vitamins-Minerals (ZINC PO) Take 1 tablet by mouth daily.    [provider]  polyethylene glycol-electrolytes (NULYTELY) 420 g solution As directed 04/02/21   Rourk, Cristopher Estimable, MD  simvastatin (ZOCOR) 40 MG tablet Take 40 mg by mouth at bedtime. 05/31/19   [provider]  tadalafil (CIALIS) 5 MG tablet Take 5 mg by mouth in the morning. 09/27/20   [provider]  Tafluprost, PF, 0.0015 % SOLN Place 1 drop into both eyes at bedtime.    [provider]  tamsulosin (FLOMAX) 0.4 MG CAPS capsule Take 0.4 mg by mouth at  bedtime. 05/31/19   [provider]    Family History Family History  Problem Relation Age of Onset   Hypertension Mother    Diabetes Mother    Myasthenia gravis Father    Prostate cancer Brother    Colon cancer Neg Hx     Social History Social History   Tobacco Use   Smoking status: Former    Packs/day: 1.00    Types: Cigarettes    Quit date: 1999    Years since quitting: 25.0   Smokeless tobacco: Never  Vaping Use   Vaping Use: Never used  Substance Use Topics   Alcohol use: Not Currently    Comment: rare   Drug use: Never     Allergies   Patient has no known allergies.   Review of Systems Review of Systems Per HPI  Physical Exam Triage Vital Signs ED Triage Vitals  Enc Vitals Group     BP 06/19/22 1239 (!) 157/103     Pulse Rate 06/19/22 1239 (!) 101     Resp 06/19/22 1239 18     Temp 06/19/22 1239 98.8 F (37.1 C)     Temp Source 06/19/22 1239 Oral     SpO2 06/19/22 1239 97 %     Weight --      Height --      Head Circumference --      Peak Flow --      Pain Score 06/19/22 1241 0     Pain Loc --      Pain Edu? --      Excl. in [place]? --    No data found.  Updated Vital Signs BP (!) 157/103 (BP Location: Right Arm)   Pulse (!) 101   Temp 98.8 F (37.1 C) (Oral)   Resp 18   SpO2 97%   Visual Acuity Right Eye Distance:   Left Eye Distance:   Bilateral Distance:    Right Eye Near:   Left Eye Near:    Bilateral Near:     Physical Exam Vitals and nursing note reviewed.  Constitutional:      Appearance: He is well-developed.  HENT:     Head: Atraumatic.     Right Ear: External ear normal.     Left Ear: External ear normal.     Nose: Rhinorrhea present.     Mouth/Throat:     Pharynx: Posterior oropharyngeal erythema present. No oropharyngeal exudate.  Eyes:     Conjunctiva/sclera: Conjunctivae normal.     Pupils: Pupils are equal, round, and reactive to light.  Cardiovascular:     Rate and Rhythm: Normal rate and regular  rhythm.  Pulmonary:     Effort: Pulmonary effort is normal. No respiratory distress.     Breath sounds: No wheezing or rales.  Musculoskeletal:  General: Normal range of motion.     Cervical back: Normal range of motion and neck supple.  Lymphadenopathy:     Cervical: No cervical adenopathy.  Skin:    General: Skin is warm and dry.  Neurological:     Mental Status: He is alert and oriented to person, place, and time.  Psychiatric:        Behavior: Behavior normal.      UC Treatments / Results  Labs (all labs ordered are listed, but only abnormal results are displayed) Labs Reviewed - No data to display  EKG   Radiology No results found.  Procedures Procedures (including critical care time)  Medications Ordered in UC Medications - No data to display  Initial Impression / Assessment and Plan / UC Course  I have reviewed the triage vital signs and the nursing notes.  Pertinent labs & imaging results that were available during my care of the patient were reviewed by me and considered in my medical decision making (see chart for details).     Vitals and exam overall reassuring today and consistent with COVID-19.  Treat with Paxlovid, Phenergan DM, nasal sprays and other over-the-counter remedies.  Discussed discontinuation of simvastatin for period of time while taking the Paxlovid.  Return precautions reviewed.  Final Clinical Impressions(s) / UC Diagnoses   Final diagnoses:  COVID     Discharge Instructions      Make sure to discontinue your simvastatin at least 12 hours prior to taking your first dose of Paxlovid and for 5 days after completion of the medication.  You may take Coricidin HBP, Flonase, plain Mucinex in addition for your symptoms.    ED Prescriptions     Medication Sig Dispense Auth. Provider   nirmatrelvir/ritonavir (PAXLOVID) 20 x 150 MG & 10 x '100MG'$  TABS Take 3 tablets by mouth 2 (two) times daily for 5 days. Patient GFR is >60.Take  nirmatrelvir (150 mg) two tablets twice daily for 5 days and ritonavir (100 mg) one tablet twice daily for 5 days. 30 tablet Volney American, [place]   promethazine-dextromethorphan (PROMETHAZINE-DM) 6.25-15 MG/5ML syrup Take 5 mLs by mouth 4 (four) times daily as needed. 100 mL Volney American, [place]      PDMP not reviewed this encounter.   Volney American, [place] 06/19/22 1320

## 2022-06-19 NOTE — Discharge Instructions (Signed)
Make sure to discontinue your simvastatin at least 12 hours prior to taking your first dose of Paxlovid and for 5 days after completion of the medication.  You may take Coricidin HBP, Flonase, plain Mucinex in addition for your symptoms.

## 2022-07-21 ENCOUNTER — Ambulatory Visit (INDEPENDENT_AMBULATORY_CARE_PROVIDER_SITE_OTHER): Payer: No Typology Code available for payment source | Admitting: Internal Medicine

## 2022-07-21 ENCOUNTER — Encounter: Payer: Self-pay | Admitting: Internal Medicine

## 2022-07-21 VITALS — BP 123/84 | HR 65 | Temp 97.8°F | Ht 77.0 in | Wt 352.0 lb

## 2022-07-21 DIAGNOSIS — Z86010 Personal history of colonic polyps: Secondary | ICD-10-CM | POA: Diagnosis not present

## 2022-07-21 DIAGNOSIS — K5909 Other constipation: Secondary | ICD-10-CM

## 2022-07-21 NOTE — Patient Instructions (Addendum)
It was good to see you again today!  As discussed, I do not feel like you have diverticulitis at this time.  Your blood pressure was minimally elevated today.  We will recheck it.  It is more likely that you are having symptomatic constipation  Continue taking your daily fiber supplement without change  Begin MiraLAX 17 g or 1 capful of powder in 8 ounces of water at bedtime - take it daily.  Back off to every other day if your stools become too loose.  Agent is entirely safe to take on a regular basis  Call me in 2 weeks and let me know how you are doing  Plan to see you back in the office in 3 months  If MiraLAX is not satisfactory in taking care of your symptoms, further evaluation may be needed.  Plan for surveillance colonoscopy (history of polyps) in 2027.

## 2022-07-21 NOTE — Progress Notes (Unsigned)
Primary Care Physician:  Carrolyn Meiers, MD Primary Gastroenterologist:  Dr.   Pre-Procedure History & Physical: HPI:  Craig Cole is a 62 y.o. male here for for further evaluation of vague abdominal bloating and increased stool frequency since the Christmas holidays were about 1 month in duration.  Bowel function slowed down some.  Takes generic Metamucil without fail.  Has done so for years.  Takes OTC laxative about once a week with some relief in his symptoms.  Does have known left-sided diverticulosis.  He has a history of diverticulitis many years ago in [place].  He differentiate his current symptoms as being very different than his localized left-sided pain associated with diverticulitis in years past.  No bleeding.  Small adenomas removed from his colon 2022; due for surveillance 2027.  Diastolic blood pressure minimally elevated today.   Past Medical History:  Diagnosis Date   Cancer [facility])    skin cancer   GERD (gastroesophageal reflux disease)    Glaucoma    High risk sexual behavior    on PrEP for HIV   Hyperlipidemia    Hypertension    Sleep apnea     Past Surgical History:  Procedure Laterality Date   COLONOSCOPY  05/25/2012   [facility]; rare diverticulum.   COLONOSCOPY WITH PROPOFOL N/A 04/28/2021   Procedure: COLONOSCOPY WITH PROPOFOL;  Surgeon: Daneil Dolin, MD;  [facility];  Service: Endoscopy;  Laterality: N/A;  8:45am   POLYPECTOMY  04/28/2021   Procedure: POLYPECTOMY;  Surgeon: Daneil Dolin, MD;  [facility];  Service: Endoscopy;;   RETINAL DETACHMENT SURGERY Left    TONSILLECTOMY      Prior to Admission medications   Medication Sig Start Date End Date Taking? Authorizing Provider  aspirin EC 81 MG tablet Take 81 mg by mouth every evening.   Yes [provider]  dorzolamide-timolol (COSOPT) 22.3-6.8 MG/ML ophthalmic solution Place 1 drop into the left eye 2 (two) times  daily.   Yes [provider]  emtricitabine-tenofovir AF (DESCOVY) 200-25 MG tablet Take 1 tablet by mouth daily. 06/08/22  Yes Kuppelweiser, Cassie L, RPH-CPP  esomeprazole (NEXIUM) 40 MG capsule Take 40 mg by mouth every morning. 05/31/19  Yes [provider]  furosemide (LASIX) 20 MG tablet TAKE 1 TABLET BY MOUTH DAILY AS NEEDED FOR LOWER LEG SWELLING 06/02/21  Yes [provider]  hydrALAZINE (APRESOLINE) 25 MG tablet Take 25 mg by mouth 2 (two) times daily. 01/27/21  Yes [provider]  lisinopril (ZESTRIL) 40 MG tablet Take 40 mg by mouth in the morning. 06/27/20  Yes [provider]  MATZIM LA 240 MG 24 hr tablet Take 240 mg by mouth in the morning. 02/25/21  Yes [provider]  montelukast (SINGULAIR) 10 MG tablet Take 10 mg by mouth at bedtime. 05/31/19  Yes [provider]  Multiple Vitamins-Minerals (ZINC PO) Take 1 tablet by mouth daily.   Yes [provider]  simvastatin (ZOCOR) 40 MG tablet Take 40 mg by mouth at bedtime. 05/31/19  Yes [provider]  tadalafil (CIALIS) 5 MG tablet Take 5 mg by mouth in the morning. 09/27/20  Yes [provider]  Tafluprost, PF, 0.0015 % SOLN Place 1 drop into both eyes at bedtime.   Yes [provider]  tamsulosin (FLOMAX) 0.4 MG CAPS capsule Take 0.4 mg by mouth at bedtime. 05/31/19  Yes [provider]    Allergies as of 07/21/2022   (  No Known Allergies)    Family History  Problem Relation Age of Onset   Hypertension Mother    Diabetes Mother    Myasthenia gravis Father    Prostate cancer Brother    Colon cancer Neg Hx     Social History   Socioeconomic History   Marital status: Single    Spouse name: Not on file   Number of children: Not on file   Years of education: Not on file   Highest education level: Not on file  Occupational History   Not on file  Tobacco Use   Smoking status: Former    Packs/day: 1.00    Types:  Cigarettes    Quit date: 1999    Years since quitting: 25.0   Smokeless tobacco: Never  Vaping Use   Vaping Use: Never used  Substance and Sexual Activity   Alcohol use: Not Currently    Comment: rare   Drug use: Never   Sexual activity: Not Currently  Other Topics Concern   Not on file  Social History Narrative   Not on file   Social Determinants of Health   Financial Resource Strain: Not on file  Food Insecurity: Not on file  Transportation Needs: Not on file  Physical Activity: Not on file  Stress: Not on file  Social Connections: Not on file  Intimate Partner Violence: Not on file    Review of Systems: See HPI, otherwise negative ROS  Physical Exam: BP (!) 135/91 (BP Location: Right Arm, Patient Position: Sitting, Cuff Size: Large)   Pulse 62   Temp 97.8 F (36.6 C) (Oral)   Ht '6\' 5"'$  (1.956 m)   Wt (!) 352 lb (159.7 kg)   SpO2 97%   BMI 41.74 kg/m  General:   Alert,  Well-developed, well-nourished, pleasant and cooperative in NAD Central obesity present. Lungs:  Clear throughout to auscultation.   No wheezes, crackles, or rhonchi. No acute distress. Heart:  Regular rate and rhythm; no murmurs, clicks, rubs,  or gallops. Abdomen: Central obesity.  Positive bowel sounds soft very minimal diffuse tenderness without apparent organomegaly or mass appreciated.   Pulses:  Normal pulses noted. Extremities:  Without clubbing or edema.  Impression/Plan: 62 year old gentleman with insidious onset constipation with associated bloating over the past 1 month.  No other symptoms. His abdominal exam is benign today.  I suspect he does have symptomatic constipation at this time.  I do not feel he has inflammatory/infectious process.  History of diverticulosis and distant history of diverticulitis.  Adenomas removed 2022; due for colonoscopy 2027.  Diastolic BP minimally elevated today.  Recommendations: As discussed, I do not feel like you have diverticulitis at this  time.  Your blood pressure was minimally elevated today.  We will recheck it.  It is more likely that you are having symptomatic constipation  Continue taking your daily fiber supplement without change  Begin MiraLAX 17 g or 1 capful of powder in 8 ounces of water at bedtime - take it daily.  Back off to every other day if your stools become too loose.  Agent is entirely safe to take on a regular basis  Call me in 2 weeks and let me know how you are doing  Plan to see you back in the office in 3 months  If MiraLAX is not satisfactory in taking care of your symptoms, further evaluation may be needed.  Plan for surveillance colonoscopy (history of polyps) in 2027.   Notice: This dictation was prepared  with Dragon dictation along with smaller phrase technology. Any transcriptional errors that result from this process are unintentional and may not be corrected upon review.

## 2022-08-24 ENCOUNTER — Other Ambulatory Visit (HOSPITAL_COMMUNITY): Payer: Self-pay | Admitting: Gerontology

## 2022-08-24 ENCOUNTER — Ambulatory Visit (HOSPITAL_COMMUNITY)
Admission: RE | Admit: 2022-08-24 | Discharge: 2022-08-24 | Disposition: A | Discharge status: Home / Self Care | Payer: No Typology Code available for payment source | Source: Ambulatory Visit | Attending: Gerontology | Admitting: Gerontology

## 2022-08-24 DIAGNOSIS — R059 Cough, unspecified: Secondary | ICD-10-CM | POA: Insufficient documentation

## 2022-08-26 ENCOUNTER — Ambulatory Visit
Admission: EM | Admit: 2022-08-26 | Discharge: 2022-08-26 | Disposition: A | Discharge status: Home / Self Care | Payer: BC Managed Care – PPO | Attending: Nurse Practitioner | Admitting: Nurse Practitioner

## 2022-08-26 DIAGNOSIS — J069 Acute upper respiratory infection, unspecified: Secondary | ICD-10-CM

## 2022-08-26 MED ORDER — PROMETHAZINE-DM 6.25-15 MG/5ML PO SYRP
5.0000 mL | ORAL_SOLUTION | Freq: Every evening | ORAL | 0 refills | Status: DC | PRN
Start: 2022-08-26 — End: 2022-10-07

## 2022-08-26 MED ORDER — BENZONATATE 100 MG PO CAPS: 100.0000 mg | ORAL_CAPSULE | Freq: Three times a day (TID) | ORAL | 0 refills | Status: DC | PRN

## 2022-08-26 NOTE — Discharge Instructions (Signed)
You have a viral upper respiratory infection.  Symptoms should improve over the next week to 10 days.  If you develop chest pain or shortness of breath, go to the emergency room.  Some things that can make you feel better are: - Increased rest - Increasing fluid with water/sugar free electrolytes - Acetaminophen and ibuprofen as needed for fever/pain - Salt water gargling, chloraseptic spray and throat lozenges - OTC guaifenesin (Mucinex) 600 mg twice daily for congestion - Saline sinus flushes or a neti pot - Humidifying the air -Tessalon Perles during the day as needed for dry cough and cough syrup at nighttime as needed for dry cough

## 2022-08-26 NOTE — ED Triage Notes (Signed)
Pt reports cough, nasal congestion and fever x 3 days. Taking ibuprofen.

## 2022-08-26 NOTE — ED Provider Notes (Signed)
[facility]    CSN: [id number] Arrival date & time: 08/26/22  1639      History   Chief Complaint Chief Complaint  Patient presents with   Fever   Cough    HPI Craig Cole is a 62 y.o. male.   Patient presents today with 3 day history of cough and feeling poorly.  Also having body aches, chills, dry cough, shortness of breath, nasal congestion, runny nose, post nasal drainage, decreased appetite, and fatigue.  Patient denies congestion, sore throat, congested cough, chest pain with coughing, wheezing, chest tightness, chest congestion, sinus pressure, headache, ear pain, abdominal pain, nausea, vomiting, diarrhea, loss of taste, loss of smell, and rash. No known sick contacts. Has taken alka seltzer plus which seems to help with symptoms temporarily.    Past Medical History:  Diagnosis Date   Cancer [facility])    skin cancer   GERD (gastroesophageal reflux disease)    Glaucoma    High risk sexual behavior    on PrEP for HIV   Hyperlipidemia    Hypertension    Sleep apnea     Patient Active Problem List   Diagnosis Date Noted   History of colon polyps 03/27/2021   On pre-exposure prophylaxis for HIV 09/10/2020   High risk sexual behavior 09/10/2020    Past Surgical History:  Procedure Laterality Date   COLONOSCOPY  05/25/2012   [facility]; rare diverticulum.   COLONOSCOPY WITH PROPOFOL N/A 04/28/2021   Procedure: COLONOSCOPY WITH PROPOFOL;  Surgeon: Daneil Dolin, MD;  Location: [facility];  Service: Endoscopy;  Laterality: N/A;  8:45am   POLYPECTOMY  04/28/2021   Procedure: POLYPECTOMY;  Surgeon: Daneil Dolin, MD;  Location: [facility];  Service: Endoscopy;;   RETINAL DETACHMENT SURGERY Left    TONSILLECTOMY         Home Medications    Prior to Admission medications   Medication Sig Start Date End Date Taking? Authorizing Provider  benzonatate (TESSALON) 100 MG capsule Take 1 capsule (100 mg total) by  mouth 3 (three) times daily as needed for cough. Do not take with alcohol or while driving or operating heavy machinery.  May cause drowsiness. 08/26/22  Yes Eulogio Bear, NP  promethazine-dextromethorphan (PROMETHAZINE-DM) 6.25-15 MG/5ML syrup Take 5 mLs by mouth at bedtime as needed for cough. Do not take with alcohol or while driving or operating heavy machinery.  May cause drowsiness. 08/26/22  Yes Eulogio Bear, NP  aspirin EC 81 MG tablet Take 81 mg by mouth every evening.    [provider]  dorzolamide-timolol (COSOPT) 22.3-6.8 MG/ML ophthalmic solution Place 1 drop into the left eye 2 (two) times daily.    [provider]  emtricitabine-tenofovir AF (DESCOVY) 200-25 MG tablet Take 1 tablet by mouth daily. 06/08/22   Kuppelweiser, Cassie L, RPH-CPP  esomeprazole (NEXIUM) 40 MG capsule Take 40 mg by mouth every morning. 05/31/19   [provider]  furosemide (LASIX) 20 MG tablet TAKE 1 TABLET BY MOUTH DAILY AS NEEDED FOR LOWER LEG SWELLING 06/02/21   [provider]  hydrALAZINE (APRESOLINE) 25 MG tablet Take 25 mg by mouth 2 (two) times daily. 01/27/21   [provider]  lisinopril (ZESTRIL) 40 MG tablet Take 40 mg by mouth in the morning. 06/27/20   [provider]  MATZIM LA 240 MG 24 hr tablet Take 240 mg by mouth in the morning. 02/25/21   [provider]  montelukast (SINGULAIR) 10  MG tablet Take 10 mg by mouth at bedtime. 05/31/19   [provider]  Multiple Vitamins-Minerals (ZINC PO) Take 1 tablet by mouth daily.    [provider]  simvastatin (ZOCOR) 40 MG tablet Take 40 mg by mouth at bedtime. 05/31/19   [provider]  tadalafil (CIALIS) 5 MG tablet Take 5 mg by mouth in the morning. 09/27/20   [provider]  Tafluprost, PF, 0.0015 % SOLN Place 1 drop into both eyes at bedtime.    [provider]  tamsulosin (FLOMAX) 0.4 MG CAPS capsule Take 0.4 mg by mouth at bedtime.  05/31/19   [provider]    Family History Family History  Problem Relation Age of Onset   Hypertension Mother    Diabetes Mother    Myasthenia gravis Father    Prostate cancer Brother    Colon cancer Neg Hx     Social History Social History   Tobacco Use   Smoking status: Former    Packs/day: 1.00    Types: Cigarettes    Quit date: 1999    Years since quitting: 25.1   Smokeless tobacco: Never  Vaping Use   Vaping Use: Never used  Substance Use Topics   Alcohol use: Not Currently    Comment: rare   Drug use: Never     Allergies   Patient has no known allergies.   Review of Systems Review of Systems Per HPI  Physical Exam Triage Vital Signs ED Triage Vitals  Enc Vitals Group     BP 08/26/22 1644 126/80     Pulse Rate 08/26/22 1644 94     Resp 08/26/22 1644 20     Temp 08/26/22 1644 98 F (36.7 C)     Temp Source 08/26/22 1644 Oral     SpO2 08/26/22 1644 97 %     Weight --      Height --      Head Circumference --      Peak Flow --      Pain Score 08/26/22 1645 0     Pain Loc --      Pain Edu? --      Excl. in [place]? --    No data found.  Updated Vital Signs BP 126/80 (BP Location: Right Arm)   Pulse 94   Temp 98 F (36.7 C) (Oral)   Resp 20   SpO2 97%   Visual Acuity Right Eye Distance:   Left Eye Distance:   Bilateral Distance:    Right Eye Near:   Left Eye Near:    Bilateral Near:     Physical Exam Vitals and nursing note reviewed.  Constitutional:      General: He is not in acute distress.    Appearance: Normal appearance. He is not ill-appearing or toxic-appearing.  HENT:     Head: Normocephalic and atraumatic.     Right Ear: Tympanic membrane, ear canal and external ear normal.     Left Ear: Tympanic membrane, ear canal and external ear normal.     Nose: Congestion and rhinorrhea present.     Mouth/Throat:     Mouth: Mucous membranes are moist.     Pharynx: Oropharynx is clear. Posterior oropharyngeal erythema  present. No oropharyngeal exudate.  Eyes:     General: No scleral icterus.    Extraocular Movements: Extraocular movements intact.  Cardiovascular:     Rate and Rhythm: Normal rate and regular rhythm.  Pulmonary:     Effort:  Pulmonary effort is normal. No respiratory distress.     Breath sounds: Normal breath sounds. No wheezing, rhonchi or rales.  Abdominal:     General: Abdomen is flat. Bowel sounds are normal. There is no distension.     Palpations: Abdomen is soft.  Musculoskeletal:     Cervical back: Normal range of motion and neck supple.  Lymphadenopathy:     Cervical: No cervical adenopathy.  Skin:    General: Skin is warm and dry.     Coloration: Skin is not jaundiced or pale.     Findings: No erythema or rash.  Neurological:     Mental Status: He is alert and oriented to person, place, and time.  Psychiatric:        Mood and Affect: Mood normal.        Behavior: Behavior normal. Behavior is cooperative.      UC Treatments / Results  Labs (all labs ordered are listed, but only abnormal results are displayed) Labs Reviewed - No data to display  EKG   Radiology No results found.  Procedures Procedures (including critical care time)  Medications Ordered in UC Medications - No data to display  Initial Impression / Assessment and Plan / UC Course  I have reviewed the triage vital signs and the nursing notes.  Pertinent labs & imaging results that were available during my care of the patient were reviewed by me and considered in my medical decision making (see chart for details).   Patient is well-appearing, normotensive, afebrile, not tachycardic, not tachypneic, oxygenating well on room air.    1. Viral URI with cough Suspect viral etiology Influenza testing deferred given length of symptoms, out of window for treatment with Tamiflu COVID-19 test deferred; he tested positive in late December 2023 Vital signs and examination are reassuring  today Supportive care discussed Start cough suppressant ER and return precautions discussed Note given for work  The patient was given the opportunity to ask questions.  All questions answered to their satisfaction.  The patient is in agreement to this plan.    Final Clinical Impressions(s) / UC Diagnoses   Final diagnoses:  Viral URI with cough     Discharge Instructions      You have a viral upper respiratory infection.  Symptoms should improve over the next week to 10 days.  If you develop chest pain or shortness of breath, go to the emergency room.  Some things that can make you feel better are: - Increased rest - Increasing fluid with water/sugar free electrolytes - Acetaminophen and ibuprofen as needed for fever/pain - Salt water gargling, chloraseptic spray and throat lozenges - OTC guaifenesin (Mucinex) 600 mg twice daily for congestion - Saline sinus flushes or a neti pot - Humidifying the air -Tessalon Perles during the day as needed for dry cough and cough syrup at nighttime as needed for dry cough     ED Prescriptions     Medication Sig Dispense Auth. Provider   benzonatate (TESSALON) 100 MG capsule Take 1 capsule (100 mg total) by mouth 3 (three) times daily as needed for cough. Do not take with alcohol or while driving or operating heavy machinery.  May cause drowsiness. 21 capsule Noemi Chapel A, NP   promethazine-dextromethorphan (PROMETHAZINE-DM) 6.25-15 MG/5ML syrup Take 5 mLs by mouth at bedtime as needed for cough. Do not take with alcohol or while driving or operating heavy machinery.  May cause drowsiness. 118 mL Eulogio Bear, NP  PDMP not reviewed this encounter.   Eulogio Bear, NP 08/26/22 [phone]

## 2022-08-27 ENCOUNTER — Encounter: Payer: Self-pay | Admitting: Radiology

## 2022-08-31 ENCOUNTER — Encounter: Payer: Self-pay | Admitting: Neurology

## 2022-08-31 ENCOUNTER — Ambulatory Visit (INDEPENDENT_AMBULATORY_CARE_PROVIDER_SITE_OTHER): Payer: No Typology Code available for payment source | Admitting: Neurology

## 2022-08-31 ENCOUNTER — Ambulatory Visit (INDEPENDENT_AMBULATORY_CARE_PROVIDER_SITE_OTHER): Payer: No Typology Code available for payment source | Admitting: Pharmacist

## 2022-08-31 ENCOUNTER — Other Ambulatory Visit: Payer: Self-pay

## 2022-08-31 VITALS — BP 94/68 | HR 66 | Ht 77.0 in | Wt 337.0 lb

## 2022-08-31 DIAGNOSIS — R079 Chest pain, unspecified: Secondary | ICD-10-CM | POA: Insufficient documentation

## 2022-08-31 DIAGNOSIS — G4733 Obstructive sleep apnea (adult) (pediatric): Secondary | ICD-10-CM

## 2022-08-31 DIAGNOSIS — Z79899 Other long term (current) drug therapy: Secondary | ICD-10-CM

## 2022-08-31 DIAGNOSIS — E669 Obesity, unspecified: Secondary | ICD-10-CM | POA: Diagnosis not present

## 2022-08-31 DIAGNOSIS — R5383 Other fatigue: Secondary | ICD-10-CM | POA: Insufficient documentation

## 2022-08-31 DIAGNOSIS — R0601 Orthopnea: Secondary | ICD-10-CM | POA: Insufficient documentation

## 2022-08-31 DIAGNOSIS — Z2981 Encounter for HIV pre-exposure prophylaxis: Secondary | ICD-10-CM | POA: Diagnosis not present

## 2022-08-31 NOTE — Progress Notes (Signed)
SLEEP MEDICINE CLINIC    Provider:  Larey Seat, MD  Primary Care Physician:  Carrolyn Meiers, MD [place] [address]     Referring Provider: Phillips Odor, [place],  [place] [address]          Chief Complaint according to patient   Patient presents with:     New Patient (Initial Visit)           HISTORY OF PRESENT ILLNESS:  08-31-2022:  Craig Cole , "Craig Cole" is a  Caucasian 62 y.o. male patient who is seen upon referral on 08/31/2022 from [facility] for transfer and continuity of sleep care. the patient reports having been on CPAP for along time and he got his last CPAP machine just about 12 months ago. 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 He struggles for all his adut life with obesity, has an enlarged abdominal girth and sleeps with elevated  head of bed, almost reclined seated. He has since noted less nocturia. He feels his current CPAP is not doing enough as he remains excessively sleepy, he sleeps often with an open mouth. He uses a nasal mask, no chin strap.   Chief concern according to patient :  " I don't get enough air through the machine"    I have the pleasure of seeing Craig Cole 08/31/22 a right-handed male with a known sleep disorder.    The patient had the first sleep study in [place] , [place],  in 1990- and later has another sleep study after falling asleep at work ( 1998)   with a result of  OSA.  Sleep relevant medical history: Nocturia, Tonsillectomy as an adult-  morbid obesity, lost weight ( 150 pounds ) through weight watchers, was 215 pounds, regained during Covid. Knee and hip arthritis,  no TBI, no sinus surgery.    Family medical /sleep history: mother on CPAP with OSA.    Social history:  Patient is retired from [employer], now working as Corporate treasurer,  and lives in a household with sister, not partner. Pets are 2 cats and one dog-  t. Tobacco use- quit 1999.  ETOH use : seldomly,  Caffeine intake in form of Coffee( 2-3 cups a day) Soda( 2-3/ week) Tea ( /) or energy drinks Exercise limitations through pain and obesity, SOB.         Sleep habits are as follows: [name] dinner time is between 7-8 PM. The patient goes to bed at 10 PM and continues to sleep for 7 hours, wakes for 2-3 bathroom breaks.   The preferred sleep position is reclined, elevated , with the support of several pillows.  Dreams are reportedly frequent/vivid.   The patient wakes up with an alarm.   5 AM is the usual rise time. He reports not fully feeling refreshed or restored in AM, with symptoms such as dry mouth, sinus congestion, . Naps are taken infrequently , falling asleep in front of the TV, not scheduled.       Craig Cole describes orthopnea the feeling of getting air easier than the head of bed is elevated while he sleeps reclined.  He has a high degree of fatigue but not an elevated degree of daytime sleepiness.  Looking at his sleep studies he was a mild to moderate sleep apnea and at one time in the past sleep connect sleep test actually failed to identify sleep apnea  at all he has always been a heavy snorer however his last sleep study in the sleep lab was at [facility] in November 2021 and at that time he had an AHI of 16.3 which is mild to moderate and an RDI of 35.5 which indicates that he was loud snoring.  He did not have central apnea CO2 was not measured.  The majority of events were so-called obstructive hypopneas so he is prone to shallow breathing.  He did not suffer from restless limb movements at night.  His oxygen saturation reached a nadir of 84% which is noncritical and was present mainly during REM sleep 31% of total REM sleep were in hypoxia.  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  The general AHI was 22.1/h the REM sleep AHI was 56.2/h clearly  elevated.   Review of Systems: Out of a complete 14 system review, the patient complains of only the following symptoms, and all other reviewed systems are negative.:   Orthopnea.  Fatigue, sleepiness , not snoring, fragmented sleep, Insomnia, Nocturia    How likely are you to doze in the following situations: 0 = not likely, 1 = slight chance, 2 = moderate chance, 3 = high chance   Sitting and Reading? Watching Television? Sitting inactive in a public place (theater or meeting)? As a passenger in a car for an hour without a break? Lying down in the afternoon when circumstances permit? Sitting and talking to someone? Sitting quietly after lunch without alcohol? In a car, while stopped for a few minutes in traffic?   Total = 8/ 24 points  on cpap.   FSS endorsed at 45/ 63 points.   Chest pain with inhalation   Social History   Socioeconomic History   Marital status: Single    Spouse name: Not on file   Number of children: Not on file   Years of education: Not on file   Highest education level: Not on file  Occupational History   Not on file  Tobacco Use   Smoking status: Former    Packs/day: 1.00    Types: Cigarettes    Quit date: 88    Years since quitting: 25.2   Smokeless tobacco: Never  Vaping Use   Vaping Use: Never used  Substance and Sexual Activity   Alcohol use: Not Currently    Comment: rare   Drug use: Never   Sexual activity: Not Currently  Other Topics Concern   Not on file  Social History Narrative   Not on file   Social Determinants of Health   Financial Resource Strain: Not on file  Food Insecurity: Not on file  Transportation Needs: Not on file  Physical Activity: Not on file  Stress: Not on file  Social Connections: Not on file    Family History  Problem Relation Age of Onset   Hypertension Mother    Diabetes Mother    Myasthenia gravis Father    Prostate cancer Brother    Colon cancer Neg Hx     Past Medical History:   Diagnosis Date   Cancer ([place])    skin cancer   GERD (gastroesophageal reflux disease)    Glaucoma    High risk sexual behavior    on PrEP for HIV   Hyperlipidemia    Hypertension    Sleep apnea     Past Surgical History:  Procedure Laterality Date   COLONOSCOPY  05/25/2012  [facility]; rare diverticulum.   COLONOSCOPY WITH PROPOFOL N/A 04/28/2021   Procedure: COLONOSCOPY WITH PROPOFOL;  Surgeon: Daneil Dolin, MD;  Location: [facility];  Service: Endoscopy;  Laterality: N/A;  8:45am   POLYPECTOMY  04/28/2021   Procedure: POLYPECTOMY;  Surgeon: Daneil Dolin, MD;  Location: [facility];  Service: Endoscopy;;   RETINAL DETACHMENT SURGERY Left    TONSILLECTOMY       Current Outpatient Medications on File Prior to Visit  Medication Sig Dispense Refill   aspirin EC 81 MG tablet Take 81 mg by mouth every evening.     benzonatate (TESSALON) 100 MG capsule Take 1 capsule (100 mg total) by mouth 3 (three) times daily as needed for cough. Do not take with alcohol or while driving or operating heavy machinery.  May cause drowsiness. 21 capsule 0   dorzolamide-timolol (COSOPT) 22.3-6.8 MG/ML ophthalmic solution Place 1 drop into the left eye 2 (two) times daily.     emtricitabine-tenofovir AF (DESCOVY) 200-25 MG tablet Take 1 tablet by mouth daily. 90 tablet 0   esomeprazole (NEXIUM) 40 MG capsule Take 40 mg by mouth every morning.     furosemide (LASIX) 20 MG tablet TAKE 1 TABLET BY MOUTH DAILY AS NEEDED FOR LOWER LEG SWELLING     hydrALAZINE (APRESOLINE) 25 MG tablet Take 25 mg by mouth 2 (two) times daily.     lisinopril (ZESTRIL) 40 MG tablet Take 40 mg by mouth in the morning.     MATZIM LA 240 MG 24 hr tablet Take 240 mg by mouth in the morning.     montelukast (SINGULAIR) 10 MG tablet Take 10 mg by mouth at bedtime.     Multiple Vitamins-Minerals (ZINC PO) Take 1 tablet by mouth daily.     promethazine-dextromethorphan (PROMETHAZINE-DM)  6.25-15 MG/5ML syrup Take 5 mLs by mouth at bedtime as needed for cough. Do not take with alcohol or while driving or operating heavy machinery.  May cause drowsiness. 118 mL 0   simvastatin (ZOCOR) 40 MG tablet Take 40 mg by mouth at bedtime.     tadalafil (CIALIS) 5 MG tablet Take 5 mg by mouth in the morning.     Tafluprost, PF, 0.0015 % SOLN Place 1 drop into both eyes at bedtime.     tamsulosin (FLOMAX) 0.4 MG CAPS capsule Take 0.4 mg by mouth at bedtime.     No current facility-administered medications on file prior to visit.    No Known Allergies   DIAGNOSTIC DATA (LABS, IMAGING, TESTING) - I reviewed patient records, labs, notes, testing and imaging myself where available.  Lab Results  Component Value Date   WBC 6.6 10/30/2020   HGB 15.0 10/30/2020   HCT 42.9 10/30/2020   MCV 88 10/30/2020   PLT 168 10/30/2020      Component Value Date/Time   NA 141 08/06/2021 1421   NA 137 10/30/2020 1026   K 4.0 08/06/2021 1421   CL 107 08/06/2021 1421   CO2 27 08/06/2021 1421   GLUCOSE 100 (H) 08/06/2021 1421   BUN 7 08/06/2021 1421   BUN 11 10/30/2020 1026   CREATININE 0.70 08/06/2021 1421   CALCIUM 9.4 08/06/2021 1421   PROT 6.9 10/30/2020 1026   ALBUMIN 4.5 10/30/2020 1026   AST 21 10/30/2020 1026   ALT 23 10/30/2020 1026   ALKPHOS 107 10/30/2020 1026   BILITOT 0.7 10/30/2020 1026   GFRNONAA >60 07/05/2020 0735   No results found for: "CHOL", "  HDL", "[place]", "LDLDIRECT", "TRIG", "CHOLHDL" No results found for: "HGBA1C" No results found for: "VITAMINB12" No results found for: "TSH"  PHYSICAL EXAM:  Today's Vitals   08/31/22 0939  BP: 94/68  Pulse: 66  Weight: (!) 337 lb (152.9 kg)  Height: '6\' 5"'$  (1.956 m)   Body mass index is 39.96 kg/m.   Wt Readings from Last 3 Encounters:  08/31/22 (!) 337 lb (152.9 kg)  07/21/22 (!) 352 lb (159.7 kg)  04/28/21 (!) 328 lb (148.8 kg)     Ht Readings from Last 3 Encounters:  08/31/22 '6\' 5"'$  (1.956 m)  07/21/22 '6\' 5"'$   (1.956 m)  04/28/21 '6\' 5"'$  (1.956 m)      General: The patient is awake, alert and appears not in acute distress. The patient is well groomed. He has facial hair.  Head: Normocephalic, atraumatic. Neck is supple. Mallampati 3.  neck circumference:18 inches . Nasal airflow partially patent.  Retrognathia is  seen.  Dental status: biological, crowded.  Cardiovascular:  Regular rate and cardiac rhythm by pulse,  without distended neck veins. Respiratory: Lungs are clear to auscultation.  Skin:  Without evidence of ankle edema, or rash. Trunk: [name] posture is erect.   NEUROLOGIC EXAM: The patient is awake and alert, oriented to place and time.   Memory subjective described as intact.  Attention span & concentration ability appears normal.  Speech is fluent,  without  dysarthria, dysphonia or aphasia.  Mood and affect are appropriate.   Cranial nerves: no loss of smell or taste reported  Pupils are equal and  round, but not briskly reactive to light. Funduscopic exam deferred. Status post cataract surgery and treatment  for retinal detachment in the left eye. Laser.  Extraocular movements in vertical and horizontal planes were intact and without nystagmus.  No Diplopia. Visual fields by finger perimetry are intact. Hearing was intact to soft voice and finger rubbing.    Facial sensation intact to fine touch.  Facial motor strength is symmetric and tongue and uvula move midline.  Neck ROM : rotation, tilt and flexion extension were normal for age and shoulder shrug was symmetrical.    Motor exam:  Symmetric bulk, tone and ROM.   Normal tone without cog -wheeling, symmetric grip strength .   Sensory:  Fine touch and vibration were normal.  Proprioception tested in the upper extremities was normal.   Coordination: Rapid alternating movements in the fingers/hands were of normal speed.  The Finger-to-nose maneuver was intact without evidence of ataxia, dysmetria or tremor.   Gait  and station: Patient could rise unassisted from a seated position, walked without assistive device.  Stance is of normal width/ base and the patient turned with 3 steps ( observation).  Toe and heel walk were deferred.  Deep tendon reflexes: in the  upper and lower extremities are symmetric and intact.  Babinski response was deferred .    ASSESSMENT AND PLAN 62 y.o. year old male  here with: His oxygen saturation reached a nadir of 84% which is noncritical and was present mainly during REM sleep 31% of total REM sleep were in hypoxia.  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  The general AHI was 22.1/h the REM sleep AHI was 56.2/h clearly elevated.  The patient is a very compliant CPAP user I was able to look back on 90 days of use and there is 100% compliance for days and hours of usage.  Average 7 hours 44 minutes.  His machine is set between 10 and 20 cm water with 3 cm EPR.  Air leakage is high 57 L a minute.  This seems to be mostly oral air leakage.  The 95th percentile pressure is close to 13 cm water he has total AHI of 5.7/h with unknown events making up 2.9 of those.  His risk factor for apnea clearly identified by his BMI which is close to 40 at this time.  He is a former smoker but does not carry a diagnosis of COPD according to his chart. Had a chest Xray last week. He has had a viral infect and had chest pain .  I ordered an ONO on CPAP and refitting to reduce the air leak, RV in 3 months , can be virtual. If hypoxemia is identified , he will need to return to the lab, possibly neededing BiPAP or biPAP ST>      1) Craig Cole is highly compliant CPAP user but his auto titration device clearly indicates a high oral air leak.  He feels ineffectively helped by CPAP- orthopnea is reported and before he slept elevated , he had Nocturia.    I would like for him to be refitted for a different interface or uses nasal  mask in conjunction with a chinstrap.  In the meantime I will order a overnight pulse oximetry while using his CPAP at night.  This will be a test to see if he has hypoxia which could also explain some of the remaining fatigue he complains about.  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   Most recently he had a viral infection according to his primary care notes this may well still be some postviral fatigue contributing as well as the chest pain on inhalation which is improving at this time.  He has REM sleep dependent Apnea , this will not respond to a dental device or Inspire.   I was very impressed with his low blood pressure readings 94/68.    I plan to follow up either personally or through our NP within months.   I would like to thank Carrolyn Meiers, MD and Phillips Odor, [place],  [place] [address] for allowing me to meet with and to take care of this pleasant patient.    After spending a total time of  45  minutes face to face and additional time for physical and neurologic examination, review of laboratory studies,  personal review of imaging studies, reports and results of other testing and review of referral information / records as far as provided in visit,   Electronically signed by: Larey Seat, MD 08/31/2022 9:53 AM  [facility] [place] certified by The [employer] of Sleep Medicine and Diplomate of the [employer] of Sleep Medicine. Board certified In Neurology through the [employer], Fellow of the [employer] of Neurology. Medical Director of [employer].

## 2022-08-31 NOTE — Patient Instructions (Signed)

## 2022-08-31 NOTE — Progress Notes (Signed)
Order from today's visit was printed and faxed to [employer]. Received confirmation that fax went through.

## 2022-08-31 NOTE — Progress Notes (Signed)
Date:  08/31/2022   HPI: Gamal Enderby is a 62 y.o. male who presents to the [facility] for HIV PrEP follow-up.  Insured   '[x]'$    Uninsured  '[]'$    Patient Active Problem List   Diagnosis Date Noted   Obesity, Class II, BMI 35-39.9 08/31/2022   OSA on CPAP 08/31/2022   Chest pain at rest 08/31/2022   Orthopnea 08/31/2022   Fatigue 08/31/2022   History of colon polyps 03/27/2021   On pre-exposure prophylaxis for HIV 09/10/2020   High risk sexual behavior 09/10/2020    Patient's Medications  New Prescriptions   No medications on file  Previous Medications   ASPIRIN EC 81 MG TABLET    Take 81 mg by mouth every evening.   BENZONATATE (TESSALON) 100 MG CAPSULE    Take 1 capsule (100 mg total) by mouth 3 (three) times daily as needed for cough. Do not take with alcohol or while driving or operating heavy machinery.  May cause drowsiness.   DORZOLAMIDE-TIMOLOL (COSOPT) 22.3-6.8 MG/ML OPHTHALMIC SOLUTION    Place 1 drop into the left eye 2 (two) times daily.   EMTRICITABINE-TENOFOVIR AF (DESCOVY) 200-25 MG TABLET    Take 1 tablet by mouth daily.   ESOMEPRAZOLE (NEXIUM) 40 MG CAPSULE    Take 40 mg by mouth every morning.   FUROSEMIDE (LASIX) 20 MG TABLET    TAKE 1 TABLET BY MOUTH DAILY AS NEEDED FOR LOWER LEG SWELLING   HYDRALAZINE (APRESOLINE) 25 MG TABLET    Take 25 mg by mouth 2 (two) times daily.   LISINOPRIL (ZESTRIL) 40 MG TABLET    Take 40 mg by mouth in the morning.   MATZIM LA 240 MG 24 HR TABLET    Take 240 mg by mouth in the morning.   MONTELUKAST (SINGULAIR) 10 MG TABLET    Take 10 mg by mouth at bedtime.   MULTIPLE VITAMINS-MINERALS (ZINC PO)    Take 1 tablet by mouth daily.   PROMETHAZINE-DEXTROMETHORPHAN (PROMETHAZINE-DM) 6.25-15 MG/5ML SYRUP    Take 5 mLs by mouth at bedtime as needed for cough. Do not take with alcohol or while driving or operating heavy machinery.  May cause drowsiness.   SIMVASTATIN (ZOCOR) 40 MG TABLET    Take 40 mg by mouth at bedtime.    TADALAFIL (CIALIS) 5 MG TABLET    Take 5 mg by mouth in the morning.   TAFLUPROST, PF, 0.0015 % SOLN    Place 1 drop into both eyes at bedtime.   TAMSULOSIN (FLOMAX) 0.4 MG CAPS CAPSULE    Take 0.4 mg by mouth at bedtime.  Modified Medications   No medications on file  Discontinued Medications   No medications on file    Allergies: No Known Allergies  Past Medical History: Past Medical History:  Diagnosis Date   Cancer ([place])    skin cancer   GERD (gastroesophageal reflux disease)    Glaucoma    High risk sexual behavior    on PrEP for HIV   Hyperlipidemia    Hypertension    Sleep apnea     Social History: Social History   Socioeconomic History   Marital status: Single    Spouse name: Not on file   Number of children: Not on file   Years of education: Not on file   Highest education level: Not on file  Occupational History   Not on file  Tobacco Use   Smoking status: Former    Packs/day: 1.00  Types: Cigarettes    Quit date: 23    Years since quitting: 25.2   Smokeless tobacco: Never  Vaping Use   Vaping Use: Never used  Substance and Sexual Activity   Alcohol use: Not Currently    Comment: rare   Drug use: Never   Sexual activity: Not Currently  Other Topics Concern   Not on file  Social History Narrative   Not on file   Social Determinants of Health   Financial Resource Strain: Not on file  Food Insecurity: Not on file  Transportation Needs: Not on file  Physical Activity: Not on file  Stress: Not on file  Social Connections: Not on file       07/12/2019   10:25 AM  [facility] HIV PREP FLOWSHEET RESULTS  Insurance Status Insured  Gender at birth Male  Gender identity cis-Male  Risk for HIV Condomless vaginal or anal intercourse;Hx of STI  Sex Partners Men only  # sex partners past 3-6 mos 4-6  Sex activity preferences Oral;Receptive  Condom use No  HIV symptoms? N/A    Labs:  SCr: Lab Results  Component Value Date   CREATININE 0.70  08/06/2021   CREATININE 0.70 (L) 10/30/2020   CREATININE 0.73 07/05/2020   CREATININE 0.72 06/10/2020   CREATININE 0.77 10/17/2019   HIV Lab Results  Component Value Date   HIV NON-REACTIVE 06/08/2022   HIV NON-REACTIVE 03/09/2022   HIV NON-REACTIVE 12/08/2021   HIV NON-REACTIVE 08/06/2021   HIV NON-REACTIVE 03/27/2021   Hepatitis B Lab Results  Component Value Date   HEPBSAB NON-REACTIVE 12/08/2021   HEPBSAG NON-REACTIVE 07/12/2019   Hepatitis C Lab Results  Component Value Date   HEPCAB NON-REACTIVE 07/12/2019   Hepatitis A Lab Results  Component Value Date   HAV REACTIVE (A) 07/12/2019   RPR and STI Lab Results  Component Value Date   LABRPR REACTIVE (A) 08/06/2021   LABRPR REACTIVE (A) 09/10/2020   LABRPR REACTIVE (A) 03/11/2020   LABRPR REACTIVE (A) 07/12/2019   RPRTITER 1:4 (H) 08/06/2021   RPRTITER 1:4 (H) 09/10/2020   RPRTITER 1:4 (H) 03/11/2020   RPRTITER 1:8 (H) 07/12/2019    STI Results GC CT  12/02/2020 11:59 AM Negative    Negative    Negative  Negative    Negative    Negative   09/10/2020  9:56 AM Negative    Negative    Negative  Negative    Positive    Negative   03/11/2020 11:45 AM Negative  Negative   07/12/2019 11:02 AM Negative  Negative     Assessment: Marden Noble comes in today to follow up for HIV PrEP. He continues to do well on Descovy and takes it every day. He receives it from [facility] and prefers a 90 day supply. No issues or side effects. He has not been sexually active lately and has had no new partners so he politely declines STI testing today. Will check a HIV antibody test and a BMP to monitor his kidney function. He lives in [place] and really prefers to coordinate his doctor visits on the same day. He is requesting his follow up to be in August. He is low risk so I do not mind to help him coordinate. Will send in 4 months of Descovy if/when his HIV test comes back negative. He is up-to-date on his  vaccines.   Plan: - HIV antibody and BMP today - Descovy x 4 months if HIV negative - F/u with me on  8/13 at 1130am  Hagar Sadiq L. Juma [place], PharmD, BCIDP, AAHIVP, CPP Clinical Pharmacist Practitioner Infectious Diseases [facility] 08/31/2022, 11:05 AM

## 2022-09-01 ENCOUNTER — Other Ambulatory Visit: Payer: Self-pay | Admitting: Pharmacist

## 2022-09-01 DIAGNOSIS — Z79899 Other long term (current) drug therapy: Secondary | ICD-10-CM

## 2022-09-01 LAB — BASIC METABOLIC PANEL
BUN: 19 mg/dL (ref 7–25)
CO2: 21 mmol/L (ref 20–32)
Calcium: 9.4 mg/dL (ref 8.6–10.3)
Chloride: 108 mmol/L (ref 98–110)
Creat: 0.78 mg/dL (ref 0.70–1.35)
Glucose, Bld: 106 mg/dL — ABNORMAL HIGH (ref 65–99)
Potassium: 4 mmol/L (ref 3.5–5.3)
Sodium: 142 mmol/L (ref 135–146)

## 2022-09-01 LAB — HIV ANTIBODY (ROUTINE TESTING W REFLEX): HIV 1&2 Ab, 4th Generation: NONREACTIVE

## 2022-09-01 MED ORDER — DESCOVY 200-25 MG PO TABS: 1.0000 | ORAL_TABLET | Freq: Every day | ORAL | 0 refills | Status: DC

## 2022-09-02 ENCOUNTER — Telehealth: Payer: Self-pay | Admitting: Neurology

## 2022-09-02 DIAGNOSIS — R0902 Hypoxemia: Secondary | ICD-10-CM

## 2022-09-02 DIAGNOSIS — R5383 Other fatigue: Secondary | ICD-10-CM

## 2022-09-02 DIAGNOSIS — G4733 Obstructive sleep apnea (adult) (pediatric): Secondary | ICD-10-CM

## 2022-09-02 DIAGNOSIS — E66812 Obesity, class 2: Secondary | ICD-10-CM

## 2022-09-02 DIAGNOSIS — E669 Obesity, unspecified: Secondary | ICD-10-CM

## 2022-09-02 NOTE — Telephone Encounter (Signed)
Received a report from [facility] where the patient c/o ONO on CPAP. This test was completed on 08/31/2022 22:57-3/13/2024 at 05:03 am.  Will have Dr Dohmeier review and advise of recommendation.

## 2022-09-03 ENCOUNTER — Encounter: Payer: Self-pay | Admitting: Neurology

## 2022-09-03 NOTE — Telephone Encounter (Signed)
Dr Dohmeier reviewed the ONO on CPAP and found that the oxygen was less than 90% for a total of 19 min and 28 sec. There was some fluctuation with the heart rate and looks like the heart rate jumped up as high as 208 at one point. Dr Dohmeier would like to bring him in for a CPAP titration to bleed oxygen if needed.

## 2022-09-06 ENCOUNTER — Ambulatory Visit
Admission: EM | Admit: 2022-09-06 | Discharge: 2022-09-06 | Disposition: A | Discharge status: Home / Self Care | Payer: BC Managed Care – PPO | Attending: Nurse Practitioner | Admitting: Nurse Practitioner

## 2022-09-06 DIAGNOSIS — R110 Nausea: Secondary | ICD-10-CM

## 2022-09-06 MED ORDER — ONDANSETRON 4 MG PO TBDP: 4.0000 mg | ORAL_TABLET | Freq: Three times a day (TID) | ORAL | 0 refills | Status: DC | PRN

## 2022-09-06 NOTE — ED Provider Notes (Signed)
[facility]    CSN: [id number] Arrival date & time: 09/06/22  1306      History   Chief Complaint Chief Complaint  Patient presents with   Nausea    HPI Craig Cole is a 62 y.o. male.   The history is provided by the patient.   The patient presents for complaints of nausea, intermittent abdominal pain, and difficulty breathing.  Symptoms have been present for the past 3 to 4 weeks.  Patient states he has spoken to his primary care physician who is ordering a test to check his breathing.  Patient states that with regard to his breathing, he does get winded when he is at rest.  In fact, he states that he was at church singing, and became short of breath during singing.  With regard to his abdominal pain, pain is intermittent, and located in the bilateral upper quadrants.  He states that he has experienced nausea over the past several weeks as well when eating certain foods.  States that he only has nausea with certain foods.  Patient denies fever, chills, diarrhea, gas, bloating, urinary frequency, urgency, or hesitancy.  Patient states he did speak to GI about his symptoms over the last several weeks, and they thought it may be constipation.  States he has been taking MiraLAX, which has helped his symptoms.  He states that he has also been taking ibuprofen for his abdominal pain.  Past Medical History:  Diagnosis Date   Cancer ([place])    skin cancer   GERD (gastroesophageal reflux disease)    Glaucoma    High risk sexual behavior    on PrEP for HIV   Hyperlipidemia    Hypertension    Sleep apnea     Patient Active Problem List   Diagnosis Date Noted   Obesity, Class II, BMI 35-39.9 08/31/2022   OSA on CPAP 08/31/2022   Chest pain at rest 08/31/2022   Orthopnea 08/31/2022   Fatigue 08/31/2022   History of colon polyps 03/27/2021   On pre-exposure prophylaxis for HIV 09/10/2020   High risk sexual behavior 09/10/2020    Past Surgical History:  Procedure  Laterality Date   COLONOSCOPY  05/25/2012   [facility]; rare diverticulum.   COLONOSCOPY WITH PROPOFOL N/A 04/28/2021   Procedure: COLONOSCOPY WITH PROPOFOL;  Surgeon: Daneil Dolin, MD;  Location: [facility];  Service: Endoscopy;  Laterality: N/A;  8:45am   POLYPECTOMY  04/28/2021   Procedure: POLYPECTOMY;  Surgeon: Daneil Dolin, MD;  Location: [facility];  Service: Endoscopy;;   RETINAL DETACHMENT SURGERY Left    TONSILLECTOMY         Home Medications    Prior to Admission medications   Medication Sig Start Date End Date Taking? Authorizing Provider  ondansetron (ZOFRAN-ODT) 4 MG disintegrating tablet Take 1 tablet (4 mg total) by mouth every 8 (eight) hours as needed for nausea or vomiting. 09/06/22  Yes Raijon Lindfors-Warren, Alda Lea, NP  aspirin EC 81 MG tablet Take 81 mg by mouth every evening.    [provider]  benzonatate (TESSALON) 100 MG capsule Take 1 capsule (100 mg total) by mouth 3 (three) times daily as needed for cough. Do not take with alcohol or while driving or operating heavy machinery.  May cause drowsiness. 08/26/22   Eulogio Bear, NP  dorzolamide-timolol (COSOPT) 22.3-6.8 MG/ML ophthalmic solution Place 1 drop into the left eye 2 (two) times daily.    [provider]  emtricitabine-tenofovir AF (DESCOVY) 200-25 MG tablet Take 1 tablet by mouth daily. 09/01/22   Kuppelweiser, Cassie L, RPH-CPP  esomeprazole (NEXIUM) 40 MG capsule Take 40 mg by mouth every morning. 05/31/19   [provider]  furosemide (LASIX) 20 MG tablet TAKE 1 TABLET BY MOUTH DAILY AS NEEDED FOR LOWER LEG SWELLING 06/02/21   [provider]  hydrALAZINE (APRESOLINE) 25 MG tablet Take 25 mg by mouth 2 (two) times daily. 01/27/21   [provider]  lisinopril (ZESTRIL) 40 MG tablet Take 40 mg by mouth in the morning. 06/27/20   [provider]  MATZIM LA 240 MG 24 hr tablet Take 240 mg by mouth in the morning.  02/25/21   [provider]  montelukast (SINGULAIR) 10 MG tablet Take 10 mg by mouth at bedtime. 05/31/19   [provider]  Multiple Vitamins-Minerals (ZINC PO) Take 1 tablet by mouth daily.    [provider]  promethazine-dextromethorphan (PROMETHAZINE-DM) 6.25-15 MG/5ML syrup Take 5 mLs by mouth at bedtime as needed for cough. Do not take with alcohol or while driving or operating heavy machinery.  May cause drowsiness. 08/26/22   Eulogio Bear, NP  simvastatin (ZOCOR) 40 MG tablet Take 40 mg by mouth at bedtime. 05/31/19   [provider]  tadalafil (CIALIS) 5 MG tablet Take 5 mg by mouth in the morning. 09/27/20   [provider]  Tafluprost, PF, 0.0015 % SOLN Place 1 drop into both eyes at bedtime.    [provider]  tamsulosin (FLOMAX) 0.4 MG CAPS capsule Take 0.4 mg by mouth at bedtime. 05/31/19   [provider]    Family History Family History  Problem Relation Age of Onset   Hypertension Mother    Diabetes Mother    Myasthenia gravis Father    Prostate cancer Brother    Colon cancer Neg Hx     Social History Social History   Tobacco Use   Smoking status: Former    Packs/day: 1    Types: Cigarettes    Quit date: 1999    Years since quitting: 25.2   Smokeless tobacco: Never  Vaping Use   Vaping Use: Never used  Substance Use Topics   Alcohol use: Not Currently    Comment: rare   Drug use: Never     Allergies   Patient has no known allergies.   Review of Systems Review of Systems Per HPI  Physical Exam Triage Vital Signs ED Triage Vitals  Enc Vitals Group     BP 09/06/22 1505 98/67     Pulse Rate 09/06/22 1505 (!) 103     Resp 09/06/22 1505 18     Temp 09/06/22 1505 98 F (36.7 C)     Temp Source 09/06/22 1505 Oral     SpO2 09/06/22 1505 95 %     Weight --      Height --      Head Circumference --      Peak Flow --      Pain Score 09/06/22 1506 0     Pain Loc --      Pain Edu? --       Excl. in [place]? --    No data found.  Updated Vital Signs BP 98/67 (BP Location: Right Arm)   Pulse (!) 103   Temp 98 F (36.7 C) (Oral)   Resp 18   SpO2 95%   Visual Acuity Right Eye Distance:   Left Eye Distance:  Bilateral Distance:    Right Eye Near:   Left Eye Near:    Bilateral Near:     Physical Exam Vitals and nursing note reviewed.  Constitutional:      General: He is not in acute distress.    Appearance: Normal appearance.  HENT:     Head: Normocephalic.  Eyes:     Extraocular Movements: Extraocular movements intact.     Pupils: Pupils are equal, round, and reactive to light.  Cardiovascular:     Rate and Rhythm: Regular rhythm. Tachycardia present.     Pulses: Normal pulses.     Heart sounds: Normal heart sounds.  Pulmonary:     Effort: Pulmonary effort is normal. No respiratory distress.     Breath sounds: Normal breath sounds. No stridor. No wheezing, rhonchi or rales.  Abdominal:     General: Bowel sounds are normal.     Palpations: Abdomen is soft.     Tenderness: There is no abdominal tenderness.  Musculoskeletal:     Cervical back: Normal range of motion.  Skin:    General: Skin is warm and dry.  Neurological:     General: No focal deficit present.     Mental Status: He is alert and oriented to person, place, and time.  Psychiatric:        Mood and Affect: Mood normal.        Behavior: Behavior normal.      UC Treatments / Results  Labs (all labs ordered are listed, but only abnormal results are displayed) Labs Reviewed - No data to display  EKG   Radiology No results found.  Procedures Procedures (including critical care time)  Medications Ordered in UC Medications - No data to display  Initial Impression / Assessment and Plan / UC Course  I have reviewed the triage vital signs and the nursing notes.  Pertinent labs & imaging results that were available during my care of the patient were reviewed by me and considered in my  medical decision making (see chart for details).  The patient is well-appearing, he is in no acute distress, vital signs are stable.  Patient resents for complaints of shortness of breath, nausea, and abdominal pain.  Symptoms have been present for the past 3 to 4 weeks.  No concern for acute abdomen at this time as patient has no abdominal tenderness, and has no fever.  Patient is following up with his primary care physician at this time for his breathing.  With regard to his nausea and abdominal pain, patient was prescribed Zofran 4 mg as needed.  Patient was advised to try an elimination diet or a gluten-free diet to see if this will help his symptoms.  Patient advised that if symptoms do not improve, recommend that he follow-up with GI for further evaluation.  Discussed strict ER follow-up precautions with the patient.  Patient is in agreement with this plan of care and verbalizes understanding.  All questions were answered.  Patient stable for discharge.   Final Clinical Impressions(s) / UC Diagnoses   Final diagnoses:  Nausea without vomiting     Discharge Instructions      Take medication as prescribed. Increase fluids and allow for plenty of rest. As discussed, recommend an elimination diet to see if this will help with your symptoms.  An elimination diet consist of removing things from your diet that irritate or aggravate your symptoms.  Also consider foods that are gluten-free to see if this helps with your nausea.  As discussed, recommend eating 6 smaller meals instead of 3 large meals per day. Continue over-the-counter Tylenol as needed for pain, fever, or general discomfort. If you develop worsening nausea with new fever, chills, abdominal pain, or other concerns, please follow-up in the emergency department. Recommend that you follow-up with gastroenterology and with your primary care physician as discussed. Follow-up as needed.     ED Prescriptions     Medication Sig  Dispense Auth. Provider   ondansetron (ZOFRAN-ODT) 4 MG disintegrating tablet Take 1 tablet (4 mg total) by mouth every 8 (eight) hours as needed for nausea or vomiting. 20 tablet Orva Riles-Warren, Alda Lea, NP      PDMP not reviewed this encounter.   Tish Men, NP 09/06/22 1610

## 2022-09-06 NOTE — ED Triage Notes (Signed)
Pt reports difficulty breathing, nausea, abdominal pain when eating x 3-4 weeks. Reports he has history of diverticulitis and wants to know if can be related to this.

## 2022-09-06 NOTE — Discharge Instructions (Signed)
Take medication as prescribed. Increase fluids and allow for plenty of rest. As discussed, recommend an elimination diet to see if this will help with your symptoms.  An elimination diet consist of removing things from your diet that irritate or aggravate your symptoms.  Also consider foods that are gluten-free to see if this helps with your nausea. As discussed, recommend eating 6 smaller meals instead of 3 large meals per day. Continue over-the-counter Tylenol as needed for pain, fever, or general discomfort. If you develop worsening nausea with new fever, chills, abdominal pain, or other concerns, please follow-up in the emergency department. Recommend that you follow-up with gastroenterology and with your primary care physician as discussed. Follow-up as needed.

## 2022-09-09 ENCOUNTER — Other Ambulatory Visit (HOSPITAL_COMMUNITY): Payer: Self-pay | Admitting: Radiology

## 2022-09-09 DIAGNOSIS — J45909 Unspecified asthma, uncomplicated: Secondary | ICD-10-CM

## 2022-09-14 ENCOUNTER — Other Ambulatory Visit: Payer: Self-pay | Admitting: Nurse Practitioner

## 2022-09-15 ENCOUNTER — Inpatient Hospital Stay (HOSPITAL_COMMUNITY)
Admission: EM | Admit: 2022-09-15 | Discharge: 2022-09-19 | DRG: 309 | Disposition: A | Discharge status: Home / Self Care | Payer: No Typology Code available for payment source | Attending: Family Medicine | Admitting: Family Medicine

## 2022-09-15 ENCOUNTER — Other Ambulatory Visit: Payer: Self-pay

## 2022-09-15 ENCOUNTER — Emergency Department (HOSPITAL_COMMUNITY): Payer: No Typology Code available for payment source

## 2022-09-15 ENCOUNTER — Encounter (HOSPITAL_COMMUNITY): Payer: Self-pay | Admitting: *Deleted

## 2022-09-15 DIAGNOSIS — Z8042 Family history of malignant neoplasm of prostate: Secondary | ICD-10-CM | POA: Diagnosis not present

## 2022-09-15 DIAGNOSIS — R000 Tachycardia, unspecified: Secondary | ICD-10-CM | POA: Diagnosis present

## 2022-09-15 DIAGNOSIS — Z1152 Encounter for screening for COVID-19: Secondary | ICD-10-CM

## 2022-09-15 DIAGNOSIS — Z8249 Family history of ischemic heart disease and other diseases of the circulatory system: Secondary | ICD-10-CM

## 2022-09-15 DIAGNOSIS — G4733 Obstructive sleep apnea (adult) (pediatric): Secondary | ICD-10-CM

## 2022-09-15 DIAGNOSIS — I10 Essential (primary) hypertension: Secondary | ICD-10-CM | POA: Diagnosis not present

## 2022-09-15 DIAGNOSIS — I864 Gastric varices: Secondary | ICD-10-CM | POA: Diagnosis present

## 2022-09-15 DIAGNOSIS — Z85828 Personal history of other malignant neoplasm of skin: Secondary | ICD-10-CM

## 2022-09-15 DIAGNOSIS — K219 Gastro-esophageal reflux disease without esophagitis: Secondary | ICD-10-CM | POA: Diagnosis not present

## 2022-09-15 DIAGNOSIS — J4520 Mild intermittent asthma, uncomplicated: Secondary | ICD-10-CM

## 2022-09-15 DIAGNOSIS — Z833 Family history of diabetes mellitus: Secondary | ICD-10-CM

## 2022-09-15 DIAGNOSIS — Z6841 Body Mass Index (BMI) 40.0 and over, adult: Secondary | ICD-10-CM

## 2022-09-15 DIAGNOSIS — C787 Secondary malignant neoplasm of liver and intrahepatic bile duct: Secondary | ICD-10-CM | POA: Diagnosis present

## 2022-09-15 DIAGNOSIS — J45909 Unspecified asthma, uncomplicated: Secondary | ICD-10-CM | POA: Diagnosis present

## 2022-09-15 DIAGNOSIS — Z7251 High risk heterosexual behavior: Secondary | ICD-10-CM

## 2022-09-15 DIAGNOSIS — E782 Mixed hyperlipidemia: Secondary | ICD-10-CM

## 2022-09-15 DIAGNOSIS — R0989 Other specified symptoms and signs involving the circulatory and respiratory systems: Secondary | ICD-10-CM | POA: Diagnosis present

## 2022-09-15 DIAGNOSIS — E785 Hyperlipidemia, unspecified: Secondary | ICD-10-CM | POA: Diagnosis present

## 2022-09-15 DIAGNOSIS — H409 Unspecified glaucoma: Secondary | ICD-10-CM | POA: Diagnosis present

## 2022-09-15 DIAGNOSIS — Z79899 Other long term (current) drug therapy: Secondary | ICD-10-CM

## 2022-09-15 DIAGNOSIS — I4891 Unspecified atrial fibrillation: Secondary | ICD-10-CM | POA: Diagnosis not present

## 2022-09-15 DIAGNOSIS — R110 Nausea: Secondary | ICD-10-CM | POA: Diagnosis present

## 2022-09-15 DIAGNOSIS — D72829 Elevated white blood cell count, unspecified: Secondary | ICD-10-CM | POA: Diagnosis present

## 2022-09-15 DIAGNOSIS — R0781 Pleurodynia: Secondary | ICD-10-CM | POA: Diagnosis present

## 2022-09-15 DIAGNOSIS — J019 Acute sinusitis, unspecified: Secondary | ICD-10-CM | POA: Diagnosis present

## 2022-09-15 DIAGNOSIS — K8689 Other specified diseases of pancreas: Secondary | ICD-10-CM | POA: Diagnosis present

## 2022-09-15 DIAGNOSIS — K5900 Constipation, unspecified: Secondary | ICD-10-CM | POA: Diagnosis present

## 2022-09-15 DIAGNOSIS — Z7982 Long term (current) use of aspirin: Secondary | ICD-10-CM | POA: Diagnosis not present

## 2022-09-15 DIAGNOSIS — Z87891 Personal history of nicotine dependence: Secondary | ICD-10-CM

## 2022-09-15 DIAGNOSIS — R0609 Other forms of dyspnea: Secondary | ICD-10-CM | POA: Diagnosis present

## 2022-09-15 DIAGNOSIS — C252 Malignant neoplasm of tail of pancreas: Secondary | ICD-10-CM | POA: Diagnosis present

## 2022-09-15 LAB — COMPREHENSIVE METABOLIC PANEL
ALT: 39 U/L (ref 0–44)
AST: 44 U/L — ABNORMAL HIGH (ref 15–41)
Albumin: 2.7 g/dL — ABNORMAL LOW (ref 3.5–5.0)
Alkaline Phosphatase: 165 U/L — ABNORMAL HIGH (ref 38–126)
Anion gap: 8 (ref 5–15)
BUN: 15 mg/dL (ref 8–23)
CO2: 22 mmol/L (ref 22–32)
Calcium: 9.1 mg/dL (ref 8.9–10.3)
Chloride: 103 mmol/L (ref 98–111)
Creatinine, Ser: 0.58 mg/dL — ABNORMAL LOW (ref 0.61–1.24)
GFR, Estimated: >60 mL/min (ref >60)
Glucose, Bld: 119 mg/dL — ABNORMAL HIGH (ref 70–99)
Potassium: 3.9 mmol/L (ref 3.5–5.1)
Sodium: 133 mmol/L — ABNORMAL LOW (ref 135–145)
Total Bilirubin: 1.2 mg/dL (ref 0.3–1.2)
Total Protein: 5.9 g/dL — ABNORMAL LOW (ref 6.5–8.1)

## 2022-09-15 LAB — URINALYSIS, ROUTINE W REFLEX MICROSCOPIC
Bilirubin Urine: NEGATIVE
Glucose, UA: NEGATIVE mg/dL
Hgb urine dipstick: NEGATIVE
Ketones, ur: NEGATIVE mg/dL
Leukocytes,Ua: NEGATIVE
Nitrite: NEGATIVE
Protein, ur: NEGATIVE mg/dL
Specific Gravity, Urine: 1.02 (ref 1.005–1.030)
pH: 5 (ref 5.0–8.0)

## 2022-09-15 LAB — CBC WITH DIFFERENTIAL/PLATELET
Abs Immature Granulocytes: 0.15 10*3/uL — ABNORMAL HIGH (ref 0.00–0.07)
Basophils Absolute: 0.1 10*3/uL (ref 0.0–0.1)
Basophils Relative: 0 %
Eosinophils Absolute: 2 10*3/uL — ABNORMAL HIGH (ref 0.0–0.5)
Eosinophils Relative: 9 %
HCT: 44.3 % (ref 39.0–52.0)
Hemoglobin: 14 g/dL (ref 13.0–17.0)
Immature Granulocytes: 1 %
Lymphocytes Relative: 4 %
Lymphs Abs: 0.8 10*3/uL (ref 0.7–4.0)
MCH: 27.1 pg (ref 26.0–34.0)
MCHC: 31.6 g/dL (ref 30.0–36.0)
MCV: 85.9 fL (ref 80.0–100.0)
Monocytes Absolute: 1.8 10*3/uL — ABNORMAL HIGH (ref 0.1–1.0)
Monocytes Relative: 8 %
Neutro Abs: 16.7 10*3/uL — ABNORMAL HIGH (ref 1.7–7.7)
Neutrophils Relative %: 78 %
Platelets: 179 10*3/uL (ref 150–400)
RBC: 5.16 MIL/uL (ref 4.22–5.81)
RDW: 15.5 % (ref 11.5–15.5)
WBC: 21.5 10*3/uL — ABNORMAL HIGH (ref 4.0–10.5)
nRBC: 0 % (ref 0.0–0.2)

## 2022-09-15 LAB — PROTIME-INR
INR: 1.3 — ABNORMAL HIGH (ref 0.8–1.2)
Prothrombin Time: 15.8 seconds — ABNORMAL HIGH (ref 11.4–15.2)

## 2022-09-15 LAB — RESP PANEL BY RT-PCR (RSV, FLU A&B, COVID)  RVPGX2
Influenza A by PCR: NEGATIVE
Influenza B by PCR: NEGATIVE
Resp Syncytial Virus by PCR: NEGATIVE
SARS Coronavirus 2 by RT PCR: NEGATIVE

## 2022-09-15 LAB — MAGNESIUM: Magnesium: 1.9 mg/dL (ref 1.7–2.4)

## 2022-09-15 LAB — TROPONIN I (HIGH SENSITIVITY)
Troponin I (High Sensitivity): 7 ng/L (ref <18)
Troponin I (High Sensitivity): 7 ng/L (ref <18)

## 2022-09-15 LAB — LACTIC ACID, PLASMA
Lactic Acid, Venous: 1.1 mmol/L (ref 0.5–1.9)
Lactic Acid, Venous: 1.2 mmol/L (ref 0.5–1.9)

## 2022-09-15 LAB — TSH: TSH: 1.39 u[IU]/mL (ref 0.350–4.500)

## 2022-09-15 LAB — BRAIN NATRIURETIC PEPTIDE: B Natriuretic Peptide: 159 pg/mL — ABNORMAL HIGH (ref 0.0–100.0)

## 2022-09-15 LAB — PROCALCITONIN: Procalcitonin: 0.28 ng/mL

## 2022-09-15 MED ORDER — ALBUTEROL SULFATE (2.5 MG/3ML) 0.083% IN NEBU: 2.5000 mg | INHALATION_SOLUTION | Freq: Four times a day (QID) | RESPIRATORY_TRACT | Status: DC | PRN

## 2022-09-15 MED ORDER — IBUPROFEN 400 MG PO TABS
400.0000 mg | ORAL_TABLET | Freq: Four times a day (QID) | ORAL | Status: DC | PRN
  Administered 2022-09-15 – 2022-09-16 (×2): 400 mg via ORAL
  Filled 2022-09-15 (×2): qty 1

## 2022-09-15 MED ORDER — MORPHINE SULFATE (PF) 2 MG/ML IV SOLN: 2.0000 mg | INTRAVENOUS | Status: DC | PRN

## 2022-09-15 MED ORDER — TAMSULOSIN HCL 0.4 MG PO CAPS
0.4000 mg | ORAL_CAPSULE | Freq: Every day | ORAL | Status: DC
  Administered 2022-09-15 – 2022-09-18 (×4): 0.4 mg via ORAL
  Filled 2022-09-15 (×4): qty 1

## 2022-09-15 MED ORDER — ONDANSETRON HCL 4 MG/2ML IJ SOLN: 4.0000 mg | Freq: Four times a day (QID) | INTRAMUSCULAR | Status: DC | PRN

## 2022-09-15 MED ORDER — CHLORHEXIDINE GLUCONATE CLOTH 2 % EX PADS
6.0000 | MEDICATED_PAD | Freq: Every day | CUTANEOUS | Status: DC
  Administered 2022-09-15 – 2022-09-19 (×4): 6 via TOPICAL

## 2022-09-15 MED ORDER — IOHEXOL 350 MG/ML SOLN
100.0000 mL | Freq: Once | INTRAVENOUS | Status: AC | PRN
  Administered 2022-09-15: 100 mL via INTRAVENOUS

## 2022-09-15 MED ORDER — SIMVASTATIN 20 MG PO TABS
40.0000 mg | ORAL_TABLET | Freq: Every day | ORAL | Status: DC
  Administered 2022-09-15: 40 mg via ORAL
  Filled 2022-09-15: qty 2

## 2022-09-15 MED ORDER — PANTOPRAZOLE SODIUM 40 MG PO TBEC
40.0000 mg | DELAYED_RELEASE_TABLET | Freq: Every day | ORAL | Status: DC
  Administered 2022-09-15 – 2022-09-19 (×4): 40 mg via ORAL
  Filled 2022-09-15 (×5): qty 1

## 2022-09-15 MED ORDER — DILTIAZEM HCL-DEXTROSE 125-5 MG/125ML-% IV SOLN (PREMIX)
5.0000 mg/h | INTRAVENOUS | Status: AC
  Administered 2022-09-15 – 2022-09-16 (×2): 5 mg/h via INTRAVENOUS
  Filled 2022-09-15 (×2): qty 125

## 2022-09-15 MED ORDER — METOCLOPRAMIDE HCL 5 MG/ML IJ SOLN
10.0000 mg | Freq: Once | INTRAMUSCULAR | Status: AC
  Administered 2022-09-15: 10 mg via INTRAVENOUS
  Filled 2022-09-15: qty 2

## 2022-09-15 MED ORDER — OXYCODONE HCL 5 MG PO TABS: 5.0000 mg | ORAL_TABLET | ORAL | Status: DC | PRN

## 2022-09-15 MED ORDER — PNEUMOCOCCAL 20-VAL CONJ VACC 0.5 ML IM SUSY: 0.5000 mL | PREFILLED_SYRINGE | INTRAMUSCULAR | Status: DC

## 2022-09-15 MED ORDER — HYDRALAZINE HCL 20 MG/ML IJ SOLN: 10.0000 mg | Freq: Three times a day (TID) | INTRAMUSCULAR | Status: DC | PRN

## 2022-09-15 MED ORDER — ONDANSETRON HCL 4 MG PO TABS: 4.0000 mg | ORAL_TABLET | Freq: Four times a day (QID) | ORAL | Status: DC | PRN

## 2022-09-15 MED ORDER — SODIUM CHLORIDE 0.9 % IV SOLN: INTRAVENOUS | Status: DC

## 2022-09-15 MED ORDER — MONTELUKAST SODIUM 10 MG PO TABS
10.0000 mg | ORAL_TABLET | Freq: Every day | ORAL | Status: DC
  Administered 2022-09-15 – 2022-09-18 (×4): 10 mg via ORAL
  Filled 2022-09-15 (×4): qty 1

## 2022-09-15 MED ORDER — LACTATED RINGERS IV BOLUS
500.0000 mL | Freq: Once | INTRAVENOUS | Status: AC
  Administered 2022-09-15: 500 mL via INTRAVENOUS

## 2022-09-15 NOTE — ED Triage Notes (Signed)
Pt states he has been having sob for the last 3 months with some lethargy and loss of appetite

## 2022-09-15 NOTE — Progress Notes (Signed)
Pt arived to room 9 via stretcher on tele, pt A/Ox4 no complaints at this time, VSS, A-fib on the monitor.

## 2022-09-15 NOTE — ED Provider Notes (Signed)
[facility] Provider Note   CSN: [id number] Arrival date & time: 09/15/22  [id number]     History  Chief Complaint  Patient presents with   Shortness of Breath    Craig Cole is a 62 y.o. male.   Shortness of Breath Associated symptoms: cough   Patient presents for multiple complaints.  Medical history includes GERD, HLD, HTN, sleep apnea.  He reports that he had onset of recent symptoms 3 months ago.  They have been progressive.  They began with shortness of breath that was worsened with exertion and singing.  He subsequently had upper abdominal discomfort and was seen by his GI doctor.  He was started on laxatives and states that this did improve his abdominal discomfort.  He states that he has had ongoing "queasy" feeling in his upper abdomen.  He has not had vomiting or diarrhea.  He did stop taking the laxatives and has not had a bowel movement in the past several days.  Patient still endorses shortness of breath.  He did undergo a chest x-ray since the onset of the symptoms and this was clear.  He was seen in urgent care 9 days ago and prescribed Zofran.  In addition to his ongoing shortness of breath, patient reports ongoing nausea, dry mouth feeling, loss of taste, intermittent chills and feeling hot, night sweats, cough, poor appetite.      Home Medications Prior to Admission medications   Medication Sig Start Date End Date Taking? Authorizing Provider  aspirin EC 81 MG tablet Take 81 mg by mouth every evening.   Yes [provider]  DILT-XR 240 MG 24 hr capsule Take 240 mg by mouth daily. 06/29/22  Yes [provider]  emtricitabine-tenofovir AF (DESCOVY) 200-25 MG tablet Take 1 tablet by mouth daily. 09/01/22  Yes Kuppelweiser, Cassie L, RPH-CPP  esomeprazole (NEXIUM) 40 MG capsule Take 40 mg by mouth every morning. 05/31/19  Yes [provider]  furosemide (LASIX) 20 MG tablet TAKE 1 TABLET BY MOUTH DAILY AS  NEEDED FOR LOWER LEG SWELLING 06/02/21  Yes [provider]  hydrALAZINE (APRESOLINE) 25 MG tablet Take 25 mg by mouth 2 (two) times daily. 01/27/21  Yes [provider]  lisinopril (ZESTRIL) 40 MG tablet Take 40 mg by mouth in the morning. 06/27/20  Yes [provider]  montelukast (SINGULAIR) 10 MG tablet Take 10 mg by mouth at bedtime. 05/31/19  Yes [provider]  Multiple Vitamins-Minerals (ZINC PO) Take 1 tablet by mouth daily.   Yes [provider]  ondansetron (ZOFRAN-ODT) 4 MG disintegrating tablet Take 1 tablet (4 mg total) by mouth every 8 (eight) hours as needed for nausea or vomiting. 09/06/22  Yes Leath-Warren, Alda Lea, NP  promethazine-dextromethorphan (PROMETHAZINE-DM) 6.25-15 MG/5ML syrup Take 5 mLs by mouth at bedtime as needed for cough. Do not take with alcohol or while driving or operating heavy machinery.  May cause drowsiness. 08/26/22  Yes Eulogio Bear, NP  simvastatin (ZOCOR) 40 MG tablet Take 40 mg by mouth at bedtime. 05/31/19  Yes [provider]  tadalafil (CIALIS) 5 MG tablet Take 5 mg by mouth in the morning. 09/27/20  Yes [provider]  Tafluprost, PF, 0.0015 % SOLN Place 1 drop into both eyes at bedtime.   Yes [provider]  tamsulosin (FLOMAX) 0.4 MG CAPS capsule Take 0.4 mg by mouth at bedtime. 05/31/19  Yes [provider]  benzonatate (TESSALON) 100 MG capsule Take 1  capsule (100 mg total) by mouth 3 (three) times daily as needed for cough. Do not take with alcohol or while driving or operating heavy machinery.  May cause drowsiness. Patient not taking: Reported on 09/15/2022 08/26/22   Eulogio Bear, NP      Allergies    Patient has no known allergies.    Review of Systems   Review of Systems  Constitutional:  Positive for activity change, appetite change, chills and fatigue.  Respiratory:  Positive for cough and shortness of breath.   Gastrointestinal:  Positive for  nausea.  All other systems reviewed and are negative.   Physical Exam Updated Vital Signs BP 120/69   Pulse (!) 105   Temp 99.2 F (37.3 C) (Oral)   Resp (!) 29   Ht 6\' 5"  (1.956 m)   Wt (!) 149.7 kg   SpO2 98%   BMI 39.13 kg/m  Physical Exam Vitals and nursing note reviewed.  Constitutional:      General: He is not in acute distress.    Appearance: He is well-developed. He is not ill-appearing, toxic-appearing or diaphoretic.  HENT:     Head: Normocephalic and atraumatic.     Mouth/Throat:     Mouth: Mucous membranes are moist.  Eyes:     Extraocular Movements: Extraocular movements intact.     Conjunctiva/sclera: Conjunctivae normal.  Neck:     Vascular: No JVD.  Cardiovascular:     Rate and Rhythm: Normal rate and regular rhythm.     Heart sounds: No murmur heard. Pulmonary:     Effort: Pulmonary effort is normal. No tachypnea, accessory muscle usage or respiratory distress.     Breath sounds: Normal breath sounds. No decreased breath sounds, wheezing, rhonchi or rales.  Chest:     Chest wall: No tenderness.  Abdominal:     Palpations: Abdomen is soft.     Tenderness: There is no abdominal tenderness.  Musculoskeletal:        General: No swelling.     Cervical back: Neck supple.     Right lower leg: No edema.     Left lower leg: No edema.  Skin:    General: Skin is warm and dry.     Coloration: Skin is not cyanotic or pale.  Neurological:     General: No focal deficit present.     Mental Status: He is alert and oriented to person, place, and time.  Psychiatric:        Mood and Affect: Mood normal.        Behavior: Behavior normal.     ED Results / Procedures / Treatments   Labs (all labs ordered are listed, but only abnormal results are displayed) Labs Reviewed  COMPREHENSIVE METABOLIC PANEL - Abnormal; Notable for the following components:      Result Value   Sodium 133 (*)    Glucose, Bld 119 (*)    Creatinine, Ser 0.58 (*)    Total Protein 5.9  (*)    Albumin 2.7 (*)    AST 44 (*)    Alkaline Phosphatase 165 (*)    All other components within normal limits  BRAIN NATRIURETIC PEPTIDE - Abnormal; Notable for the following components:   B Natriuretic Peptide 159.0 (*)    All other components within normal limits  CBC WITH DIFFERENTIAL/PLATELET - Abnormal; Notable for the following components:   WBC 21.5 (*)    Neutro Abs 16.7 (*)    Monocytes Absolute 1.8 (*)  Eosinophils Absolute 2.0 (*)    Abs Immature Granulocytes 0.15 (*)    All other components within normal limits  URINALYSIS, ROUTINE W REFLEX MICROSCOPIC - Abnormal; Notable for the following components:   Color, Urine AMBER (*)    APPearance HAZY (*)    All other components within normal limits  PROTIME-INR - Abnormal; Notable for the following components:   Prothrombin Time 15.8 (*)    INR 1.3 (*)    All other components within normal limits  RESP PANEL BY RT-PCR (RSV, FLU A&B, COVID)  RVPGX2  CULTURE, BLOOD (ROUTINE X 2)  CULTURE, BLOOD (ROUTINE X 2)  MAGNESIUM  TSH  LACTIC ACID, PLASMA  LACTIC ACID, PLASMA  CANCER ANTIGEN 19-9  TROPONIN I (HIGH SENSITIVITY)  TROPONIN I (HIGH SENSITIVITY)    EKG EKG Interpretation  Date/Time:  Tuesday September 15 2022 09:28:35 EDT Ventricular Rate:  104 PR Interval:    QRS Duration: 136 QT Interval:  346 QTC Calculation: 456 R Axis:   -44 Text Interpretation: Atrial fibrillation Left bundle branch block Confirmed by Godfrey Pick ([id number]) on 09/15/2022 1:38:46 PM  Radiology CT ABDOMEN PELVIS W CONTRAST  Result Date: 09/15/2022 CLINICAL DATA:  Abdominal pain, shortness of breath, lethargy and loss of appetite for 3 months * Tracking Code: BO * EXAM: CT ABDOMEN AND PELVIS WITH CONTRAST TECHNIQUE: Multidetector CT imaging of the abdomen and pelvis was performed using the standard protocol following bolus administration of intravenous contrast. RADIATION DOSE REDUCTION: This exam was performed according to the departmental  dose-optimization program which includes automated exposure control, adjustment of the mA and/or kV according to patient size and/or use of iterative reconstruction technique. CONTRAST:  162mL OMNIPAQUE IOHEXOL 350 MG/ML SOLN COMPARISON:  None Available. FINDINGS: Lower chest: Please see separately reported examination of the chest. Hepatobiliary: Numerous hypodense lesions throughout the liver, largest lesion in the anterior liver dome, hepatic segment VIII measuring 4.8 x 4.2 cm (series 4, image 43). Severe hepatomegaly, maximum coronal span 26.5 cm (series 8, image 64). No gallstones, gallbladder wall thickening, or biliary dilatation. Pancreas: Hypodense mass arising from the tip of the pancreatic tail and involving the closely adjacent splenic hilum measuring 6.0 x 5.0 cm (series 4, image 40). No pancreatic ductal dilatation or surrounding inflammatory changes. Spleen: Splenomegaly, maximum coronal span 16.0 cm. Adrenals/Urinary Tract: Adrenal glands are unremarkable. Simple, benign bilateral renal cortical cysts, for which no further follow-up or characterization is required. Kidneys are otherwise normal, without renal calculi, solid lesion, or hydronephrosis. Bladder is unremarkable. Stomach/Bowel: Stomach is within normal limits. Appendix appears normal. No evidence of bowel wall thickening, distention, or inflammatory changes. Vascular/Lymphatic: Aortic atherosclerosis. 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 No enlarged abdominal or pelvic lymph nodes. Reproductive: No mass or other significant abnormality. Other: No abdominal wall hernia or abnormality. No ascites. Musculoskeletal: No acute or significant osseous findings. IMPRESSION: 1. Hypodense mass arising from the tip of the pancreatic tail and involving the closely adjacent splenic hilum measuring 6.0 x 5.0 cm consistent with primary pancreatic adenocarcinoma. 2. Numerous hypodense metastatic  lesions throughout the liver. 3. Severe hepatomegaly and splenomegaly. 4. 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 Aortic Atherosclerosis (ICD10-I70.0). Electronically Signed   By: Delanna Ahmadi M.D.   On: 09/15/2022 12:50   CT Angio Chest PE W and/or Wo Contrast  Result Date: 09/15/2022 CLINICAL DATA:  PE suspected, shortness of breath for 3 months * Tracking  Code: BO * EXAM: CT ANGIOGRAPHY CHEST WITH CONTRAST TECHNIQUE: Multidetector CT imaging of the chest was performed using the standard protocol during bolus administration of intravenous contrast. Multiplanar CT image reconstructions and MIPs were obtained to evaluate the vascular anatomy. RADIATION DOSE REDUCTION: This exam was performed according to the departmental dose-optimization program which includes automated exposure control, adjustment of the mA and/or kV according to patient size and/or use of iterative reconstruction technique. CONTRAST:  174mL OMNIPAQUE IOHEXOL 350 MG/ML SOLN COMPARISON:  None Available. FINDINGS: Cardiovascular: Satisfactory opacification of the pulmonary arteries to the segmental level. No evidence of pulmonary embolism. Normal heart size. Three-vessel coronary artery calcifications. No pericardial effusion. Aortic atherosclerosis. Mediastinum/Nodes: No enlarged mediastinal, hilar, or axillary lymph nodes. Thyroid gland, trachea, and esophagus demonstrate no significant findings. Lungs/Pleura: Trace right pleural effusion with associated scarring and or atelectasis of the right lung base. Upper Abdomen: Numerous hypodense lesions throughout the liver as well as a mass of the pancreatic tail (series 07/24/2003). Musculoskeletal: No chest wall abnormality. No acute osseous findings. Review of the MIP images confirms the above findings. IMPRESSION: 1. Negative examination for pulmonary embolism. 2. Trace right pleural effusion with associated scarring and or atelectasis of  the right lung base. 3. Numerous hypodense lesions throughout the liver as well as a mass of the pancreatic tail. Findings are highly concerning for primary pancreatic malignancy and hepatic metastatic disease; abdominal findings further discussed on forthcoming dedicated, separately reported examination of the abdomen and pelvis. 4. Coronary artery disease. Aortic Atherosclerosis (ICD10-I70.0). Electronically Signed   By: Delanna Ahmadi M.D.   On: 09/15/2022 12:44    Procedures Procedures    Medications Ordered in ED Medications  lactated ringers bolus 500 mL (has no administration in time range)  diltiazem (CARDIZEM) 125 mg in dextrose 5% 125 mL (1 mg/mL) infusion (has no administration in time range)  metoCLOPramide (REGLAN) injection 10 mg (10 mg Intravenous Given 09/15/22 0919)  iohexol (OMNIPAQUE) 350 MG/ML injection 100 mL (100 mLs Intravenous Contrast Given 09/15/22 1207)  lactated ringers bolus 500 mL (500 mLs Intravenous New Bag/Given 09/15/22 1239)    ED Course/ Medical Decision Making/ A&P                             Medical Decision Making Amount and/or Complexity of Data Reviewed Labs: ordered. Radiology: ordered.  Risk Prescription drug management.   This patient presents to the ED for concern of multiple complaints, this involves an extensive number of treatment options, and is a complaint that carries with it a high risk of complications and morbidity.  The differential diagnosis includes infection, metabolic derangements, neoplasm, PE, polypharmacy   Co morbidities that complicate the patient evaluation  GERD, HLD, HTN, sleep apnea   Additional history obtained:  Additional history obtained from N/A External records from outside source obtained and reviewed including EMR   Lab Tests:  I Ordered, and personally interpreted labs.  The pertinent results include: Leukocytosis is present.  Hemoglobin is normal.  There is mild elevation in alkaline phos and  decreases in serum protein and albumin.  Kidney function is preserved.  BNP is slightly elevated.  Troponin and lactate are normal.   Imaging Studies ordered:  I ordered imaging studies including CTA chest, CT of abdomen and pelvis I independently visualized and interpreted imaging which showed pancreatic tail mass concerning for primary adenocarcinoma with numerous metastatic lesions throughout liver.  There are findings consistent with occlusion of distal  splenic vein from mass effect causing splenic and gastric varices.  There is a trace right pleural effusion with associated atelectasis. I agree with the radiologist interpretation   Cardiac Monitoring: / EKG:  The patient was maintained on a cardiac monitor.  I personally viewed and interpreted the cardiac monitored which showed an underlying rhythm of: Atrial fibrillation   Consultations Obtained:  I requested consultation with the oncologist, Dr. Delton Coombes,  and discussed lab and imaging findings as well as pertinent plan - they recommend: Admission to medicine, check of CA 19-9, IR guided liver biopsy   Problem List / ED Course / Critical interventions / Medication management  Patient presents for multitude of complaints of symptoms that have been progressive over the past 3 months.  Among these include nausea, shortness of breath, poor appetite, chills, cough.  He is well-appearing on exam.  Abdomen is soft and without tenderness.  His breathing is unlabored and lungs are clear to auscultation.  Broad diagnostic workup was initiated.  Notable lab and imaging findings are detailed above.  Patient was informed of CT scan findings concerning for metastatic pancreatic cancer.  He stated that he was expecting something like that.  While in the ED, patient had atrial fibrillation.  Although initially heart rate was in the normal range, he subsequently had irregularly irregular tachycardia in the range of 120.  He also had soft blood  pressures which improved with IV fluids.  When asked about atrial fibrillation, patient denies any history of the same.  24 years ago, he was told that he had a "irregular heart rate".  It was not called atrial fibrillation the time.  He was started on diltiazem and has been taking diltiazem ever since.  He did take his dose this morning.  Given his leukocytosis, soft blood pressures and tachycardia, additional workup was obtained to assess for possible infection.  No source of infection was identified.  Lactate was normal.  Dilt gtt. was initiated.  I spoke with oncologist on-call, Dr. Delton Coombes, who recommends checking CA 19-9 level and admission to medicine.  Plan will be for IR guided liver biopsy while admitted with [facility] transport to [facility] for this procedure and transport back.  Patient was admitted for further management. I ordered medication including IV fluids for hypotension; Reglan for nausea; diltiazem for rate control Reevaluation of the patient after these medicines showed that the patient improved I have reviewed the patients home medicines and have made adjustments as needed   Social Determinants of Health:  Lives independently         Final Clinical Impression(s) / ED Diagnoses Final diagnoses:  Atrial fibrillation with RVR ([place])  Pancreatic mass    Rx / DC Orders ED Discharge Orders     None         Godfrey Pick, MD 09/15/22 1556

## 2022-09-15 NOTE — Assessment & Plan Note (Addendum)
-   Significant leukocytosis at 21.5 without an obvious sign of infection, WBC trending down slowly - COVID and flu negative - UA is not indicative of UTI - CT chest abdomen pelvis shows a pancreatic mass but no signs of infection - Lactic acid normal x 2 - Procalcitonin 0.28  - Continue to monitor -- does have sinusitis symptoms, treating with oral augmentin

## 2022-09-15 NOTE — Assessment & Plan Note (Signed)
Continue statin. 

## 2022-09-15 NOTE — ED Notes (Signed)
Got in touch with secretary she will have DR. Katragadda call DR. Dixon

## 2022-09-15 NOTE — ED Notes (Signed)
Phlebotomy at bedside.

## 2022-09-15 NOTE — Assessment & Plan Note (Addendum)
-   New finding on CT scan - Dr. Raliegh Ip was consulted from the ED recommended a CA 19-9, and a IR ultrasound-guided biopsy of the liver - Heart healthy diet now, n.p.o. after midnight - s/p IR biopsy of liver 09/18/22 -- pt to transport to [facility] and then return to [facility] after procedure  -- results from pathology to be available by next week per radiology -- urgent referral requested to Dr. Raliegh Ip to review and discuss pathology results

## 2022-09-15 NOTE — ED Notes (Signed)
MD notified re: pts BP sys 93, see MD orders for fluid bolus

## 2022-09-15 NOTE — Assessment & Plan Note (Addendum)
-   Continue Singulair - Albuterol as needed - Continue to monitor

## 2022-09-15 NOTE — Assessment & Plan Note (Addendum)
-   With associated dyspnea on exertion - Initially started on diltiazem drip and now transitioned to oral diltiazem and oral metoprolol increased to 50 mg BID  - Holding off on anticoagulation at this time as oncology is recommending IR guided liver biopsy - CHA2DS2-VASc 1 - Echo completed see below - Monitor and correct electrolytes as indicated - Troponin 7, 7 - No signs of infection - Check TSH---> 2.127 - Continue to monitor -- continue diltiazem 60 mg Q6h and metoprolol 50 mg BID

## 2022-09-15 NOTE — H&P (Signed)
History and Physical    Patient: Craig Cole [id number] DOB: [birth year] DOA: 09/15/2022 DOS: the patient was seen and examined on 09/15/2022 PCP: Carrolyn Meiers, MD  Patient coming from: Home  Chief Complaint:  Chief Complaint  Patient presents with   Shortness of Breath   HPI: Craig Cole is a 62 y.o. male with medical history significant of Skin cancer, GERD, hyperlipidemia, hypertension, presents the ED with a chief complaint of not feeling well. Patient reports this started 2 to 3 months ago shortness of breath.  Patient reports that he first noticed it while singing, as he is a Scientist, clinical (histocompatibility and immunogenetics).  He reports that he noticed he could not finish as many songs and he was feeling completely exhausted while singing.  He then noticed some rib pain with breathing.  He had a cough that was productive of clear sputum but has since gotten better.  Patient denies any chest pain, palpitations, audible wheezing.  He does have a history of asthma.  He used to have a rescue inhaler but after being started on Singulair he did not need the rescue inhaler, and has not been on it since.  Ports that his symptoms then became more related to gastrointestinal tract.  He saw his GI doctor was recommended to take a daily laxative.  He took his daily laxative he reports his abdominal pain went away.  Prior to the daily laxative his pain was described as sharp in the right upper quadrant and left upper quadrant.  It was intermittent.  Eating had no effect on it.  Patient has not had an appetite though and has lost some weight.  Patient reports that he noticed that some foods, i.e. hamburger, did provoke the pain, but eating in general did not make it worse.  Patient reports she has had hot flashes during the day due to cold sweats at night.  He reports he takes ibuprofen before bed and attributes waking up in a cold sweat to his fever breaking due to the ibuprofen.  The highest measured temp he had at home is 100.0.   Patient reports he is also had some nausea but no vomiting.  He has not had any hematochezia or melena.  His last normal meal was so long ago that he cannot remember.  This morning for breakfast he had 2 eggs and then he had for lunch some crackers in the ED.  His last normal bowel movement was day before yesterday.  He reports he has not been taking laxative the past couple days because he has not felt good enough.  Patient reports that in 1999 there was a mention of an irregular heartbeat during her hospitalization.  Since then he was following with cardiologist and was not told that he had an irregular heartbeat or atrial fibrillation again.  Patient reports his last visit with cardiology was prior to him moving to this area as it was before 2020.  Patient reports he does not feel palpitations or chest pain.  Patient does report that at the beginning of last week he thought he was getting better, and felt better for 2 weeks.  Then this past week he started getting worse again.  He was going to try to hold out to see his specialist, but he has not seen pulmonology until 17 April and he does not see GI until 29th.  You decided he could not wait that long and came into the ED today.  Patient does not smoke, does not drink, does not  use illicit drugs.  He is vaccinated for COVID and flu.  Patient started. Review of Systems: As mentioned in the history of present illness. All other systems reviewed and are negative. Past Medical History:  Diagnosis Date   Cancer [facility])    skin cancer   GERD (gastroesophageal reflux disease)    Glaucoma    High risk sexual behavior    on PrEP for HIV   Hyperlipidemia    Hypertension    Sleep apnea    Past Surgical History:  Procedure Laterality Date   COLONOSCOPY  05/25/2012   [facility]; rare diverticulum.   COLONOSCOPY WITH PROPOFOL N/A 04/28/2021   Procedure: COLONOSCOPY WITH PROPOFOL;  Surgeon: Daneil Dolin, MD;  Location:  [facility];  Service: Endoscopy;  Laterality: N/A;  8:45am   POLYPECTOMY  04/28/2021   Procedure: POLYPECTOMY;  Surgeon: Daneil Dolin, MD;  Location: [facility];  Service: Endoscopy;;   RETINAL DETACHMENT SURGERY Left    TONSILLECTOMY     Social History:  reports that he quit smoking about 25 years ago. His smoking use included cigarettes. He smoked an average of 1 pack per day. He has never used smokeless tobacco. He reports that he does not currently use alcohol. He reports that he does not use drugs.  No Known Allergies  Family History  Problem Relation Age of Onset   Hypertension Mother    Diabetes Mother    Myasthenia gravis Father    Prostate cancer Brother    Colon cancer Neg Hx     Prior to Admission medications   Medication Sig Start Date End Date Taking? Authorizing Provider  aspirin EC 81 MG tablet Take 81 mg by mouth every evening.   Yes [provider]  DILT-XR 240 MG 24 hr capsule Take 240 mg by mouth daily. 06/29/22  Yes [provider]  emtricitabine-tenofovir AF (DESCOVY) 200-25 MG tablet Take 1 tablet by mouth daily. 09/01/22  Yes Kuppelweiser, Cassie L, RPH-CPP  esomeprazole (NEXIUM) 40 MG capsule Take 40 mg by mouth every morning. 05/31/19  Yes [provider]  furosemide (LASIX) 20 MG tablet TAKE 1 TABLET BY MOUTH DAILY AS NEEDED FOR LOWER LEG SWELLING 06/02/21  Yes [provider]  hydrALAZINE (APRESOLINE) 25 MG tablet Take 25 mg by mouth 2 (two) times daily. 01/27/21  Yes [provider]  lisinopril (ZESTRIL) 40 MG tablet Take 40 mg by mouth in the morning. 06/27/20  Yes [provider]  montelukast (SINGULAIR) 10 MG tablet Take 10 mg by mouth at bedtime. 05/31/19  Yes [provider]  Multiple Vitamins-Minerals (ZINC PO) Take 1 tablet by mouth daily.   Yes [provider]  ondansetron (ZOFRAN-ODT) 4 MG disintegrating tablet Take 1 tablet (4 mg total) by mouth every 8 (eight) hours as needed  for nausea or vomiting. 09/06/22  Yes Leath-Warren, Alda Lea, NP  promethazine-dextromethorphan (PROMETHAZINE-DM) 6.25-15 MG/5ML syrup Take 5 mLs by mouth at bedtime as needed for cough. Do not take with alcohol or while driving or operating heavy machinery.  May cause drowsiness. 08/26/22  Yes Eulogio Bear, NP  simvastatin (ZOCOR) 40 MG tablet Take 40 mg by mouth at bedtime. 05/31/19  Yes [provider]  tadalafil (CIALIS) 5 MG tablet Take 5 mg by mouth in the morning. 09/27/20  Yes [provider]  Tafluprost, PF, 0.0015 % SOLN Place 1 drop into both eyes at bedtime.   Yes [provider]  tamsulosin (FLOMAX) 0.4  MG CAPS capsule Take 0.4 mg by mouth at bedtime. 05/31/19  Yes [provider]  benzonatate (TESSALON) 100 MG capsule Take 1 capsule (100 mg total) by mouth 3 (three) times daily as needed for cough. Do not take with alcohol or while driving or operating heavy machinery.  May cause drowsiness. Patient not taking: Reported on 09/15/2022 08/26/22   Eulogio Bear, NP    Physical Exam: Vitals:   09/15/22 1745 09/15/22 1830 09/15/22 1857 09/15/22 1859  BP:   109/78   Pulse: (!) 131 (!) 128 (!) 125   Resp: 19 16 14    Temp:    98.6 F (37 C)  TempSrc:    Oral  SpO2: 95% 96% 97%   Weight:      Height:       1.  General: Patient lying supine in bed,  no acute distress   2. Psychiatric: Alert and oriented x 3, mood and behavior normal for situation, pleasant and cooperative with exam   3. Neurologic: Speech and language are normal, face is symmetric, moves all 4 extremities voluntarily, at baseline without acute deficits on limited exam   4. HEENMT:  Head is atraumatic, normocephalic, pupils reactive to light, neck is supple, trachea is midline, mucous membranes are moist   5. Respiratory : Wheezing in the right lower lung field, no, rhonchi, rales, no cyanosis, no increase in work of breathing or accessory muscle use   6.  Cardiovascular : Heart rate tachycardic, rhythm is  irregular, no murmurs, rubs or gallops, no peripheral edema, peripheral pulses palpated   7. Gastrointestinal:  Abdomen is soft, nondistended, nontender to palpation bowel sounds active, no masses or organomegaly palpated   8. Skin:  Skin is warm, dry and intact without rashes, acute lesions, or ulcers on limited exam   9.Musculoskeletal:  No acute deformities or trauma, no asymmetry in tone, no peripheral edema, peripheral pulses palpated, no tenderness to palpation in the extremities  Data Reviewed: In the ED, Temp 99.2, heart rate 48-117, respiratory 13-31, blood pressure 89/57-122/82, satting 93-98% Leukocytosis to 21.5, hemoglobin 14.0 Chemistry is unremarkable Alk phos is 165-trending with CMP in the a.m. Albumin 2.7 AST slightly elevated at 44, ALT normal at the 9.  Will trend AST on the chemistry in the a.m., and if remaining, I may need to discontinue statin and other hepatotoxic agents CT chest abdomen pelvis shows pancreatic cancer with splenic and gastric varices and metastasis to the liver.  No PE. EKG shows a heart rate of 104, atrial fibrillation Patient was given Reglan, diltiazem, LR 500 Oncology consulted and recommended CA 19-9, and IR guided biopsy Admission requested for new onset A-fib and pancreatic cancer  Assessment and Plan: * Atrial fibrillation with RVR ([place]) - With associated dyspnea on exertion - Continue diltiazem drip - Holding off on anticoagulation at this time as oncology is recommending IR guided liver biopsy - CHA2DS2-VASc 1 - Echo in the a.m. - Monitor and correct electrolytes as indicated - Troponin 7, 7 - No signs of infection - Check TSH - Continue to monitor  Leukocytosis - Significant leukocytosis at 21.5 without an obvious sign of infection - COVID and flu negative - UA is not indicative of UTI - CT chest abdomen pelvis shows a pancreatic mass but no signs of infection - Lactic  acid normal x 2 - Procalcitonin pending - Continue to monitor  Asthma, chronic - With wheezing on the right-mild - Continue Singulair - Albuterol as needed - Continue to  monitor  GERD (gastroesophageal reflux disease) - Continue PPI  Hyperlipidemia - Continue statin  Essential hypertension - Holding antihypertensives at this time as patient was soft at 89/57 in the ED - Continue gentle hydration - I am back antihypertensives if patient's blood pressure starts to climb  Pancreatic mass - New finding on CT scan - Dr. Raliegh Ip was consulted from the ED recommended a CA 19-9, and a IR ultrasound-guided biopsy of the liver - Heart healthy diet now, n.p.o. after midnight - Continue to monitor      Advance Care Planning:   Code Status: Full Code  Consults: Oncology  Family Communication: Sister at bedside  Severity of Illness: The appropriate patient status for this patient is INPATIENT. Inpatient status is judged to be reasonable and necessary in order to provide the required intensity of service to ensure the patient's safety. [name] presenting symptoms, physical exam findings, and initial radiographic and laboratory data in the context of their chronic comorbidities is felt to place them at high risk for further clinical deterioration. Furthermore, it is not anticipated that the patient will be medically stable for discharge from the hospital within 2 midnights of admission.   * I certify that at the point of admission it is my clinical judgment that the patient will require inpatient hospital care spanning beyond 2 midnights from the point of admission due to high intensity of service, high risk for further deterioration and high frequency of surveillance required.*  Author: Rolla Plate, DO 09/15/2022 7:12 PM  For on call review [email or web address].

## 2022-09-15 NOTE — Assessment & Plan Note (Addendum)
-   now on diltiazem 240 mg and oral metoprolol 50 mg BID

## 2022-09-15 NOTE — Assessment & Plan Note (Signed)
Continue PPI ?

## 2022-09-16 ENCOUNTER — Inpatient Hospital Stay (HOSPITAL_COMMUNITY): Payer: No Typology Code available for payment source

## 2022-09-16 DIAGNOSIS — I4891 Unspecified atrial fibrillation: Secondary | ICD-10-CM | POA: Diagnosis not present

## 2022-09-16 DIAGNOSIS — J4520 Mild intermittent asthma, uncomplicated: Secondary | ICD-10-CM | POA: Diagnosis not present

## 2022-09-16 DIAGNOSIS — I10 Essential (primary) hypertension: Secondary | ICD-10-CM | POA: Diagnosis not present

## 2022-09-16 DIAGNOSIS — K8689 Other specified diseases of pancreas: Secondary | ICD-10-CM | POA: Diagnosis not present

## 2022-09-16 LAB — COMPREHENSIVE METABOLIC PANEL
ALT: 37 U/L (ref 0–44)
AST: 42 U/L — ABNORMAL HIGH (ref 15–41)
Albumin: 2.5 g/dL — ABNORMAL LOW (ref 3.5–5.0)
Alkaline Phosphatase: 173 U/L — ABNORMAL HIGH (ref 38–126)
Anion gap: 7 (ref 5–15)
BUN: 12 mg/dL (ref 8–23)
CO2: 24 mmol/L (ref 22–32)
Calcium: 9.2 mg/dL (ref 8.9–10.3)
Chloride: 106 mmol/L (ref 98–111)
Creatinine, Ser: 0.67 mg/dL (ref 0.61–1.24)
GFR, Estimated: >60 mL/min (ref >60)
Glucose, Bld: 105 mg/dL — ABNORMAL HIGH (ref 70–99)
Potassium: 3.8 mmol/L (ref 3.5–5.1)
Sodium: 137 mmol/L (ref 135–145)
Total Bilirubin: 1.1 mg/dL (ref 0.3–1.2)
Total Protein: 5.7 g/dL — ABNORMAL LOW (ref 6.5–8.1)

## 2022-09-16 LAB — ECHOCARDIOGRAM COMPLETE
Area-P 1/2: 4.49 cm2
Height: 77 in
MV M vel: 2.26 m/s
MV Peak grad: 20.5 mmHg
S' Lateral: 3.2 cm
Weight: 5326.31 oz

## 2022-09-16 LAB — MAGNESIUM: Magnesium: 2 mg/dL (ref 1.7–2.4)

## 2022-09-16 LAB — RAPID URINE DRUG SCREEN, HOSP PERFORMED
Amphetamines: NOT DETECTED
Barbiturates: NOT DETECTED
Benzodiazepines: NOT DETECTED
Cocaine: NOT DETECTED
Opiates: NOT DETECTED
Tetrahydrocannabinol: NOT DETECTED

## 2022-09-16 LAB — CBC WITH DIFFERENTIAL/PLATELET
Abs Immature Granulocytes: 0.15 10*3/uL — ABNORMAL HIGH (ref 0.00–0.07)
Basophils Absolute: 0.1 10*3/uL (ref 0.0–0.1)
Basophils Relative: 0 %
Eosinophils Absolute: 1.9 10*3/uL — ABNORMAL HIGH (ref 0.0–0.5)
Eosinophils Relative: 10 %
HCT: 39.3 % (ref 39.0–52.0)
Hemoglobin: 12.2 g/dL — ABNORMAL LOW (ref 13.0–17.0)
Immature Granulocytes: 1 %
Lymphocytes Relative: 6 %
Lymphs Abs: 1.1 10*3/uL (ref 0.7–4.0)
MCH: 27.2 pg (ref 26.0–34.0)
MCHC: 31 g/dL (ref 30.0–36.0)
MCV: 87.5 fL (ref 80.0–100.0)
Monocytes Absolute: 1.5 10*3/uL — ABNORMAL HIGH (ref 0.1–1.0)
Monocytes Relative: 8 %
Neutro Abs: 15 10*3/uL — ABNORMAL HIGH (ref 1.7–7.7)
Neutrophils Relative %: 75 %
Platelets: 174 10*3/uL (ref 150–400)
RBC: 4.49 MIL/uL (ref 4.22–5.81)
RDW: 15.9 % — ABNORMAL HIGH (ref 11.5–15.5)
WBC: 19.7 10*3/uL — ABNORMAL HIGH (ref 4.0–10.5)
nRBC: 0.1 % (ref 0.0–0.2)

## 2022-09-16 LAB — TSH: TSH: 2.127 u[IU]/mL (ref 0.350–4.500)

## 2022-09-16 MED ORDER — ATORVASTATIN CALCIUM 20 MG PO TABS
20.0000 mg | ORAL_TABLET | Freq: Every day | ORAL | Status: DC
  Administered 2022-09-16 – 2022-09-19 (×3): 20 mg via ORAL
  Filled 2022-09-16 (×4): qty 1

## 2022-09-16 MED ORDER — PERFLUTREN LIPID MICROSPHERE
1.0000 mL | INTRAVENOUS | Status: AC | PRN
  Administered 2022-09-16: 3 mL via INTRAVENOUS

## 2022-09-16 MED ORDER — DILTIAZEM HCL 30 MG PO TABS
30.0000 mg | ORAL_TABLET | Freq: Four times a day (QID) | ORAL | Status: DC
  Administered 2022-09-16 – 2022-09-17 (×4): 30 mg via ORAL
  Filled 2022-09-16 (×4): qty 1

## 2022-09-16 MED ORDER — METOPROLOL TARTRATE 25 MG PO TABS
25.0000 mg | ORAL_TABLET | Freq: Two times a day (BID) | ORAL | Status: DC
  Administered 2022-09-16 (×2): 25 mg via ORAL
  Filled 2022-09-16 (×3): qty 1

## 2022-09-16 MED ORDER — MORPHINE SULFATE (PF) 2 MG/ML IV SOLN: 1.0000 mg | INTRAVENOUS | Status: DC | PRN

## 2022-09-16 NOTE — Progress Notes (Signed)
  Echocardiogram 2D Echocardiogram has been performed.  Wynelle Link 09/16/2022, 10:25 AM

## 2022-09-16 NOTE — Hospital Course (Signed)
62 y.o. male with medical history significant of Skin cancer, GERD, hyperlipidemia, hypertension, presents the ED with a chief complaint of not feeling well. Patient reports this started 2 to 3 months ago shortness of breath.  Patient reports that he first noticed it while singing, as he is a Scientist, clinical (histocompatibility and immunogenetics).  He reports that he noticed he could not finish as many songs and he was feeling completely exhausted while singing.  He then noticed some rib pain with breathing.  He had a cough that was productive of clear sputum but has since gotten better.  Patient denies any chest pain, palpitations, audible wheezing.  He does have a history of asthma.  He used to have a rescue inhaler but after being started on Singulair he did not need the rescue inhaler, and has not been on it since.  Ports that his symptoms then became more related to gastrointestinal tract.  He saw his GI doctor was recommended to take a daily laxative.  He took his daily laxative he reports his abdominal pain went away.  Prior to the daily laxative his pain was described as sharp in the right upper quadrant and left upper quadrant.  It was intermittent.  Eating had no effect on it.  Patient has not had an appetite though and has lost some weight.  Patient reports that he noticed that some foods, i.e. hamburger, did provoke the pain, but eating in general did not make it worse.  Patient reports she has had hot flashes during the day due to cold sweats at night.  He reports he takes ibuprofen before bed and attributes waking up in a cold sweat to his fever breaking due to the ibuprofen.  The highest measured temp he had at home is 100.0.  Patient reports he is also had some nausea but no vomiting.  He has not had any hematochezia or melena.  His last normal meal was so long ago that he cannot remember.  This morning for breakfast he had 2 eggs and then he had for lunch some crackers in the ED.  His last normal bowel movement was day before yesterday.  He  reports he has not been taking laxative the past couple days because he has not felt good enough.   Patient reports that in 1999 there was a mention of an irregular heartbeat during her hospitalization.  Since then he was following with cardiologist and was not told that he had an irregular heartbeat or atrial fibrillation again.  Patient reports his last visit with cardiology was prior to him moving to this area as it was before 2020.  Patient reports he does not feel palpitations or chest pain.   Patient does report that at the beginning of last week he thought he was getting better, and felt better for 2 weeks.  Then this past week he started getting worse again.  He was going to try to hold out to see his specialist, but he has not seen pulmonology until 17 April and he does not see GI until 29th.  You decided he could not wait that long and came into the ED today.   Patient does not smoke, does not drink, does not use illicit drugs.  He is vaccinated for COVID and flu.  Patient started.

## 2022-09-16 NOTE — Progress Notes (Addendum)
   Request to IR for Liver lesion biopsy Pt with wt loss; abd pain and rib pain; sob  Imaging revealing Pancreatic head mass and liver lesions  Dr Anselm Pancoast has reviewed imaging and approves procedure Worrisome is Afib and tachycardia Would prefer stable cardiac standpoint before transport to [facility] for biopsy that includes sedation  Discussed with Dr Delton Coombes and Dr Wynetta Emery about considering OP bx after DC and stable. Dr Wynetta Emery spoke to pt and he feels it would be a hardship to have as an OP secondary schedule and work needs.  I have chatted with Dr Wynetta Emery--- I am at [facility] again tomorrow.  Dr Wynetta Emery will see pt in am and contact me about cardiac stability and plans for possible discharge timing.  IR could consider biopsy as IP when appropriate as soon as possible

## 2022-09-16 NOTE — Progress Notes (Signed)
  Transition of Care [facility]) Screening Note   Patient Details  Name: Chigozie Cronister Date of Birth: [birth year]   Transition of Care [facility]) CM/SW Contact:    Ihor Gully, LCSW Phone Number: 09/16/2022, 2:18 PM    Transition of Care Department [facility]) has reviewed patient and no TOC needs have been identified at this time. We will continue to monitor patient advancement through interdisciplinary progression rounds. If new patient transition needs arise, please place a TOC consult.

## 2022-09-16 NOTE — Progress Notes (Signed)
PROGRESS NOTE   Craig Cole  [id number] DOB: Oct 25, 1960 DOA: 09/15/2022 PCP: Carrolyn Meiers, MD   Chief Complaint  Patient presents with   Shortness of Breath   Level of care: Telemetry  Brief Admission History:   62 y.o. male with medical history significant of Skin cancer, GERD, hyperlipidemia, hypertension, presents the ED with a chief complaint of not feeling well. Patient reports this started 2 to 3 months ago shortness of breath.  Patient reports that he first noticed it while singing, as he is a Scientist, clinical (histocompatibility and immunogenetics).  He reports that he noticed he could not finish as many songs and he was feeling completely exhausted while singing.  He then noticed some rib pain with breathing.  He had a cough that was productive of clear sputum but has since gotten better.  Patient denies any chest pain, palpitations, audible wheezing.  He does have a history of asthma.  He used to have a rescue inhaler but after being started on Singulair he did not need the rescue inhaler, and has not been on it since.  Ports that his symptoms then became more related to gastrointestinal tract.  He saw his GI doctor was recommended to take a daily laxative.  He took his daily laxative he reports his abdominal pain went away.  Prior to the daily laxative his pain was described as sharp in the right upper quadrant and left upper quadrant.  It was intermittent.  Eating had no effect on it.  Patient has not had an appetite though and has lost some weight.  Patient reports that he noticed that some foods, i.e. hamburger, did provoke the pain, but eating in general did not make it worse.  Patient reports she has had hot flashes during the day due to cold sweats at night.  He reports he takes ibuprofen before bed and attributes waking up in a cold sweat to his fever breaking due to the ibuprofen.  The highest measured temp he had at home is 100.0.  Patient reports he is also had some nausea but no vomiting.  He has not had any  hematochezia or melena.  His last normal meal was so long ago that he cannot remember.  This morning for breakfast he had 2 eggs and then he had for lunch some crackers in the ED.  His last normal bowel movement was day before yesterday.  He reports he has not been taking laxative the past couple days because he has not felt good enough.   Patient reports that in 1999 there was a mention of an irregular heartbeat during her hospitalization.  Since then he was following with cardiologist and was not told that he had an irregular heartbeat or atrial fibrillation again.  Patient reports his last visit with cardiology was prior to him moving to this area as it was before 2020.  Patient reports he does not feel palpitations or chest pain.   Patient does report that at the beginning of last week he thought he was getting better, and felt better for 2 weeks.  Then this past week he started getting worse again.  He was going to try to hold out to see his specialist, but he has not seen pulmonology until 17 April and he does not see GI until 29th.  You decided he could not wait that long and came into the ED today.   Patient does not smoke, does not drink, does not use illicit drugs.  He is vaccinated for COVID and  flu.  Patient started.   Assessment and Plan: * Atrial fibrillation with RVR (HCC) - With associated dyspnea on exertion - Initially started on diltiazem drip and now transitioned to oral diltiazem and oral metoprolol  - Holding off on anticoagulation at this time as oncology is recommending IR guided liver biopsy - CHA2DS2-VASc 1 - Echo pending - Monitor and correct electrolytes as indicated - Troponin 7, 7 - No signs of infection - Check TSH---> 2.127 - Continue to monitor  Leukocytosis - Significant leukocytosis at 21.5 without an obvious sign of infection, WBC trending down slowly - COVID and flu negative - UA is not indicative of UTI - CT chest abdomen pelvis shows a pancreatic mass  but no signs of infection - Lactic acid normal x 2 - Procalcitonin 0.28  - Continue to monitor  Asthma, chronic - Continue Singulair - Albuterol as needed - Continue to monitor  GERD (gastroesophageal reflux disease) - Continue PPI  Hyperlipidemia - Continue statin  Essential hypertension - now on diltiazem 30 mg Q6h and oral metoprolol 25 mg BID     Pancreatic mass - New finding on CT scan - Dr. Raliegh Ip was consulted from the ED recommended a CA 19-9, and a IR ultrasound-guided biopsy of the liver - Heart healthy diet now, n.p.o. after midnight - If HR is better controlled, tentatively plan for IR biopsy of liver 09/17/22    DVT prophylaxis: SCDs Code Status: Full  Family Communication:  Disposition: Status is: Inpatient Remains inpatient appropriate because: uncontrolled Afib RVR    Consultants:  IR radiology Oncology Delton Coombes)  Procedures:   Antimicrobials:    Subjective: Pt says he really would like to have biopsy done while in hospital due to concerns about his work schedule and not being able to easily have the procedure done outpatient.   Objective: Vitals:   09/16/22 0700 09/16/22 0800 09/16/22 1100 09/16/22 1200  BP: 127/73 122/67 114/75 130/71  Pulse: (!) 107 (!) 107 86 99  Resp: (!) 9 11 (!) 25 (!) 23  Temp:  98.3 F (36.8 C)  98.1 F (36.7 C)  TempSrc:  Oral  Oral  SpO2: 95% 96% 96% 95%  Weight:      Height:        Intake/Output Summary (Last 24 hours) at 09/16/2022 1258 Last data filed at 09/16/2022 1200 Gross per 24 hour  Intake 2930.75 ml  Output 750 ml  Net 2180.75 ml   Filed Weights   09/15/22 0743 09/15/22 2044 09/16/22 0515  Weight: (!) 149.7 kg (!) 152.4 kg (!) 151 kg   Examination:  General exam: Appears calm and comfortable  Respiratory system: Clear to auscultation. Respiratory effort normal. Cardiovascular system: tachycardic irregularly irregular rate, normal S1 & S2 heard. No JVD, murmurs, rubs, gallops or clicks. trace  pedal edema. Gastrointestinal system: Abdomen is nondistended, soft and nontender. No organomegaly or masses felt. Normal bowel sounds heard. Central nervous system: Alert and oriented. No focal neurological deficits. Extremities: Symmetric 5 x 5 power. Skin: No rashes, lesions or ulcers. Psychiatry: Judgement and insight appear normal. Mood & affect appropriate.   Data Reviewed: I have personally reviewed following labs and imaging studies  CBC: Recent Labs  Lab 09/15/22 0902 09/16/22 0431  WBC 21.5* 19.7*  NEUTROABS 16.7* 15.0*  HGB 14.0 12.2*  HCT 44.3 39.3  MCV 85.9 87.5  PLT 179 [id number]    Basic Metabolic Panel: Recent Labs  Lab 09/15/22 0902 09/16/22 0431  NA 133* 137  K 3.9 3.8  CL 103 106  CO2 22 24  GLUCOSE 119* 105*  BUN 15 12  CREATININE 0.58* 0.67  CALCIUM 9.1 9.2  MG 1.9 2.0    CBG: No results for input(s): "GLUCAP" in the last 168 hours.  Recent Results (from the past 240 hour(s))  Resp panel by RT-PCR (RSV, Flu A&B, Covid) Anterior Nasal Swab     Status: None   Collection Time: 09/15/22  9:29 AM   Specimen: Anterior Nasal Swab  Result Value Ref Range Status   SARS Coronavirus 2 by RT PCR NEGATIVE NEGATIVE Final    Comment: (NOTE) SARS-CoV-2 target nucleic acids are NOT DETECTED.  The SARS-CoV-2 RNA is generally detectable in upper respiratory specimens during the acute phase of infection. The lowest concentration of SARS-CoV-2 viral copies this assay can detect is 138 copies/mL. A negative result does not preclude SARS-Cov-2 infection and should not be used as the sole basis for treatment or other patient management decisions. A negative result may occur with  improper specimen collection/handling, submission of specimen other than nasopharyngeal swab, presence of viral mutation(s) within the areas targeted by this assay, and inadequate number of viral copies(<138 copies/mL). A negative result must be combined with clinical observations, patient  history, and epidemiological information. The expected result is Negative.  Fact Sheet for Patients:  EntrepreneurPulse.com.au  Fact Sheet for Healthcare Providers:  IncredibleEmployment.be  This test is no t yet approved or cleared by the [place] FDA and  has been authorized for detection and/or diagnosis of SARS-CoV-2 by FDA under an Emergency Use Authorization (EUA). This EUA will remain  in effect (meaning this test can be used) for the duration of the COVID-19 declaration under Section 564(b)(1) of the Act, 21 U.S.C.section 360bbb-3(b)(1), unless the authorization is terminated  or revoked sooner.       Influenza A by PCR NEGATIVE NEGATIVE Final   Influenza B by PCR NEGATIVE NEGATIVE Final    Comment: (NOTE) The Xpert Xpress SARS-CoV-2/FLU/RSV plus assay is intended as an aid in the diagnosis of influenza from Nasopharyngeal swab specimens and should not be used as a sole basis for treatment. Nasal washings and aspirates are unacceptable for Xpert Xpress SARS-CoV-2/FLU/RSV testing.  Fact Sheet for Patients: EntrepreneurPulse.com.au  Fact Sheet for Healthcare Providers: IncredibleEmployment.be  This test is not yet approved or cleared by the [place] FDA and has been authorized for detection and/or diagnosis of SARS-CoV-2 by FDA under an Emergency Use Authorization (EUA). This EUA will remain in effect (meaning this test can be used) for the duration of the COVID-19 declaration under Section 564(b)(1) of the Act, 21 U.S.C. section 360bbb-3(b)(1), unless the authorization is terminated or revoked.     Resp Syncytial Virus by PCR NEGATIVE NEGATIVE Final    Comment: (NOTE) Fact Sheet for Patients: EntrepreneurPulse.com.au  Fact Sheet for Healthcare Providers: IncredibleEmployment.be  This test is not yet approved or cleared by the [place] FDA  and has been authorized for detection and/or diagnosis of SARS-CoV-2 by FDA under an Emergency Use Authorization (EUA). This EUA will remain in effect (meaning this test can be used) for the duration of the COVID-19 declaration under Section 564(b)(1) of the Act, 21 U.S.C. section 360bbb-3(b)(1), unless the authorization is terminated or revoked.  Performed at [facility], [address], [place], [place] [address]   Blood Culture (routine x 2)     Status: None (Preliminary result)   Collection Time: 09/15/22  1:59 PM   Specimen: BLOOD LEFT HAND  Result Value Ref  Range Status   Specimen Description BLOOD LEFT HAND  Final   Special Requests   Final    BOTTLES DRAWN AEROBIC AND ANAEROBIC Blood Culture adequate volume Performed at [facility], [address], [place], [place] [address]    Culture PENDING  Incomplete   Report Status PENDING  Incomplete  Blood Culture (routine x 2)     Status: None (Preliminary result)   Collection Time: 09/15/22  2:07 PM   Specimen: BLOOD LEFT HAND  Result Value Ref Range Status   Specimen Description BLOOD LEFT ARM  Final   Special Requests   Final    BOTTLES DRAWN AEROBIC AND ANAEROBIC Blood Culture adequate volume Performed at [facility], [address] [place], [place] [address]    Culture PENDING  Incomplete   Report Status PENDING  Incomplete     Radiology Studies: ECHOCARDIOGRAM COMPLETE  Result Date: 09/16/2022    ECHOCARDIOGRAM REPORT   Patient Name:   EMRYS STAROSTA Date of Exam: 09/16/2022 Medical Rec #:  [id number]     Height:       77.0 in Accession #:    [id number]    Weight:       332.9 lb Date of Birth:  21-Dec-1960     BSA:          2.778 m Patient Age:    63 years      BP:           112/67 mmHg Patient Gender: M             HR:           92 bpm. Exam Location:  [facility] Procedure: 2D Echo, Color Doppler, Cardiac Doppler and Intracardiac            Opacification Agent Indications:    Atrial Fibrillation I48.91  History:         Patient has no prior history of Echocardiogram examinations.                 Arrythmias:Atrial Fibrillation; Risk Factors:Hypertension,                 Dyslipidemia, Former Smoker and Sleep Apnea.  Sonographer:    Greer Pickerel Referring Phys: [id number] ASIA B [place]  Sonographer Comments: No subcostal window. Image acquisition challenging due to patient body habitus and Image acquisition challenging due to respiratory motion. IMPRESSIONS  1. Left ventricular ejection fraction, by estimation, is 55 to 60%. The left ventricle has normal function. The left ventricle has no regional wall motion abnormalities. There is mild left ventricular hypertrophy. Left ventricular diastolic parameters are indeterminate.  2. RV-RA gradient 25 mmHg suggesting normal estimated RVSP presuming normal CVP. Right ventricular systolic function is normal. The right ventricular size is normal.  3. The mitral valve is grossly normal. Trivial mitral valve regurgitation.  4. The aortic valve is tricuspid. There is mild calcification of the aortic valve. Aortic valve regurgitation is not visualized. Aortic valve sclerosis is present, with no evidence of aortic valve stenosis.  5. Unable to estimate CVP. Comparison(s): No prior Echocardiogram. FINDINGS  Left Ventricle: Left ventricular ejection fraction, by estimation, is 55 to 60%. The left ventricle has normal function. The left ventricle has no regional wall motion abnormalities. Definity contrast agent was given IV to delineate the left ventricular  endocardial borders. The left ventricular internal cavity size was normal in size. There is mild left ventricular hypertrophy. Abnormal (paradoxical) septal motion, consistent with left bundle branch block. Left  ventricular diastolic function could not be evaluated due to atrial fibrillation. Left ventricular diastolic parameters are indeterminate. Right Ventricle: RV-RA gradient 25 mmHg suggesting normal estimated RVSP presuming normal CVP.  The right ventricular size is normal. No increase in right ventricular wall thickness. Right ventricular systolic function is normal. Left Atrium: Left atrial size was normal in size. Right Atrium: Right atrial size was normal in size. Pericardium: There is no evidence of pericardial effusion. Mitral Valve: The mitral valve is grossly normal. Trivial mitral valve regurgitation. Tricuspid Valve: The tricuspid valve is grossly normal. Tricuspid valve regurgitation is mild. Aortic Valve: The aortic valve is tricuspid. There is mild calcification of the aortic valve. Aortic valve regurgitation is not visualized. Aortic valve sclerosis is present, with no evidence of aortic valve stenosis. Pulmonic Valve: The pulmonic valve was grossly normal. Pulmonic valve regurgitation is trivial. Aorta: The aortic root is normal in size and structure. Venous: Unable to estimate CVP. The inferior vena cava was not well visualized. IAS/Shunts: No atrial level shunt detected by color flow Doppler.  LEFT VENTRICLE PLAX 2D LVIDd:         4.80 cm   Diastology LVIDs:         3.20 cm   LV e' medial:    11.20 cm/s LV PW:         1.20 cm   LV E/e' medial:  9.9 LV IVS:        0.90 cm   LV e' lateral:   12.10 cm/s LVOT diam:     2.40 cm   LV E/e' lateral: 9.2 LV SV:         68 LV SV Index:   25 LVOT Area:     4.52 cm  RIGHT VENTRICLE RV S prime:     15.20 cm/s TAPSE (M-mode): 1.9 cm LEFT ATRIUM             Index        RIGHT ATRIUM           Index LA diam:        5.20 cm 1.87 cm/m   RA Area:     18.00 cm LA Vol (A2C):   52.5 ml 18.90 ml/m  RA Volume:   41.60 ml  14.98 ml/m LA Vol (A4C):   75.8 ml 27.29 ml/m LA Biplane Vol: 69.6 ml 25.06 ml/m  AORTIC VALVE             PULMONIC VALVE LVOT Vmax:   99.00 cm/s  PR End Diast Vel: 1.52 msec LVOT Vmean:  66.800 cm/s LVOT VTI:    0.151 m  AORTA Ao Root diam: 3.80 cm Ao Asc diam:  3.90 cm MITRAL VALVE                TRICUSPID VALVE MV Area (PHT): 4.49 cm     TR Peak grad:   25.0 mmHg MV Decel  Time: 169 msec     TR Vmax:        250.00 cm/s MR Peak grad: 20.5 mmHg MR Vmax:      226.45 cm/s   SHUNTS MV E velocity: 111.00 cm/s  Systemic VTI:  0.15 m MV A velocity: 42.20 cm/s   Systemic Diam: 2.40 cm MV E/A ratio:  2.63 Rozann Lesches MD Electronically signed by Rozann Lesches MD Signature Date/Time: 09/16/2022/11:13:36 AM    Final    CT ABDOMEN PELVIS W CONTRAST  Result Date: 09/15/2022 CLINICAL DATA:  Abdominal pain, shortness of breath, lethargy  and loss of appetite for 3 months * Tracking Code: BO * EXAM: CT ABDOMEN AND PELVIS WITH CONTRAST TECHNIQUE: Multidetector CT imaging of the abdomen and pelvis was performed using the standard protocol following bolus administration of intravenous contrast. RADIATION DOSE REDUCTION: This exam was performed according to the departmental dose-optimization program which includes automated exposure control, adjustment of the mA and/or kV according to patient size and/or use of iterative reconstruction technique. CONTRAST:  110mL OMNIPAQUE IOHEXOL 350 MG/ML SOLN COMPARISON:  None Available. FINDINGS: Lower chest: Please see separately reported examination of the chest. Hepatobiliary: Numerous hypodense lesions throughout the liver, largest lesion in the anterior liver dome, hepatic segment VIII measuring 4.8 x 4.2 cm (series 4, image 43). Severe hepatomegaly, maximum coronal span 26.5 cm (series 8, image 64). No gallstones, gallbladder wall thickening, or biliary dilatation. Pancreas: Hypodense mass arising from the tip of the pancreatic tail and involving the closely adjacent splenic hilum measuring 6.0 x 5.0 cm (series 4, image 40). No pancreatic ductal dilatation or surrounding inflammatory changes. Spleen: Splenomegaly, maximum coronal span 16.0 cm. Adrenals/Urinary Tract: Adrenal glands are unremarkable. Simple, benign bilateral renal cortical cysts, for which no further follow-up or characterization is required. Kidneys are otherwise normal, without renal  calculi, solid lesion, or hydronephrosis. Bladder is unremarkable. Stomach/Bowel: Stomach is within normal limits. Appendix appears normal. No evidence of bowel wall thickening, distention, or inflammatory changes. Vascular/Lymphatic: Aortic atherosclerosis. 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 No enlarged abdominal or pelvic lymph nodes. Reproductive: No mass or other significant abnormality. Other: No abdominal wall hernia or abnormality. No ascites. Musculoskeletal: No acute or significant osseous findings. IMPRESSION: 1. Hypodense mass arising from the tip of the pancreatic tail and involving the closely adjacent splenic hilum measuring 6.0 x 5.0 cm consistent with primary pancreatic adenocarcinoma. 2. Numerous hypodense metastatic lesions throughout the liver. 3. Severe hepatomegaly and splenomegaly. 4. 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 Aortic Atherosclerosis (ICD10-I70.0). Electronically Signed   By: Delanna Ahmadi M.D.   On: 09/15/2022 12:50   CT Angio Chest PE W and/or Wo Contrast  Result Date: 09/15/2022 CLINICAL DATA:  PE suspected, shortness of breath for 3 months * Tracking Code: BO * EXAM: CT ANGIOGRAPHY CHEST WITH CONTRAST TECHNIQUE: Multidetector CT imaging of the chest was performed using the standard protocol during bolus administration of intravenous contrast. Multiplanar CT image reconstructions and MIPs were obtained to evaluate the vascular anatomy. RADIATION DOSE REDUCTION: This exam was performed according to the departmental dose-optimization program which includes automated exposure control, adjustment of the mA and/or kV according to patient size and/or use of iterative reconstruction technique. CONTRAST:  129mL OMNIPAQUE IOHEXOL 350 MG/ML SOLN COMPARISON:  None Available. FINDINGS: Cardiovascular: Satisfactory opacification of the pulmonary arteries to the segmental  level. No evidence of pulmonary embolism. Normal heart size. Three-vessel coronary artery calcifications. No pericardial effusion. Aortic atherosclerosis. Mediastinum/Nodes: No enlarged mediastinal, hilar, or axillary lymph nodes. Thyroid gland, trachea, and esophagus demonstrate no significant findings. Lungs/Pleura: Trace right pleural effusion with associated scarring and or atelectasis of the right lung base. Upper Abdomen: Numerous hypodense lesions throughout the liver as well as a mass of the pancreatic tail (series 07/24/2003). Musculoskeletal: No chest wall abnormality. No acute osseous findings. Review of the MIP images confirms the above findings. IMPRESSION: 1. Negative examination for pulmonary embolism. 2. Trace right pleural effusion with associated scarring and or atelectasis of the right lung base.  3. Numerous hypodense lesions throughout the liver as well as a mass of the pancreatic tail. Findings are highly concerning for primary pancreatic malignancy and hepatic metastatic disease; abdominal findings further discussed on forthcoming dedicated, separately reported examination of the abdomen and pelvis. 4. Coronary artery disease. Aortic Atherosclerosis (ICD10-I70.0). Electronically Signed   By: Delanna Ahmadi M.D.   On: 09/15/2022 12:44    Scheduled Meds:  atorvastatin  20 mg Oral Daily   Chlorhexidine Gluconate Cloth  6 each Topical Daily   diltiazem  30 mg Oral Q6H   metoprolol tartrate  25 mg Oral BID   montelukast  10 mg Oral QHS   pantoprazole  40 mg Oral Daily   pneumococcal 20-valent conjugate vaccine  0.5 mL Intramuscular Tomorrow-1000   tamsulosin  0.4 mg Oral QHS   Continuous Infusions:  sodium chloride 125 mL/hr at 09/16/22 1200    LOS: 1 day   Time spent: 25 mins  Hanley Woerner Wynetta Emery, MD How to contact the [facility] Attending or Consulting provider [place] or covering provider during after hours [place], for this patient?  Check the care team in [facility] and look for a)  attending/consulting [facility] provider listed and b) the [facility] team listed Log into [email or web address] and use [facility]'s universal password to access. If you do not have the password, please contact the hospital operator. Locate the [facility] provider you are looking for under [facility] and page to a number that you can be directly reached. If you still have difficulty reaching the provider, please page the [facility] (Director on Call) for the Hospitalists listed on amion for assistance.  09/16/2022, 12:58 PM

## 2022-09-16 NOTE — Plan of Care (Signed)

## 2022-09-16 NOTE — Progress Notes (Signed)
Patient has home CPAP unit here at hospital.  Patient self manages unit.

## 2022-09-17 DIAGNOSIS — G4733 Obstructive sleep apnea (adult) (pediatric): Secondary | ICD-10-CM

## 2022-09-17 DIAGNOSIS — K8689 Other specified diseases of pancreas: Secondary | ICD-10-CM | POA: Diagnosis not present

## 2022-09-17 DIAGNOSIS — I4891 Unspecified atrial fibrillation: Secondary | ICD-10-CM | POA: Diagnosis not present

## 2022-09-17 LAB — CBC WITH DIFFERENTIAL/PLATELET
Abs Immature Granulocytes: 0 10*3/uL (ref 0.00–0.07)
Band Neutrophils: 6 %
Basophils Absolute: 0 10*3/uL (ref 0.0–0.1)
Basophils Relative: 0 %
Eosinophils Absolute: 1.6 10*3/uL — ABNORMAL HIGH (ref 0.0–0.5)
Eosinophils Relative: 7 %
HCT: 36.1 % — ABNORMAL LOW (ref 39.0–52.0)
Hemoglobin: 11.4 g/dL — ABNORMAL LOW (ref 13.0–17.0)
Lymphocytes Relative: 7 %
Lymphs Abs: 1.6 10*3/uL (ref 0.7–4.0)
MCH: 27.5 pg (ref 26.0–34.0)
MCHC: 31.6 g/dL (ref 30.0–36.0)
MCV: 87.2 fL (ref 80.0–100.0)
Monocytes Absolute: 2.1 10*3/uL — ABNORMAL HIGH (ref 0.1–1.0)
Monocytes Relative: 9 %
Neutro Abs: 17.9 10*3/uL — ABNORMAL HIGH (ref 1.7–7.7)
Neutrophils Relative %: 71 %
Platelets: 197 10*3/uL (ref 150–400)
RBC: 4.14 MIL/uL — ABNORMAL LOW (ref 4.22–5.81)
RDW: 15.5 % (ref 11.5–15.5)
WBC: 23.3 10*3/uL — ABNORMAL HIGH (ref 4.0–10.5)
nRBC: 0 % (ref 0.0–0.2)

## 2022-09-17 LAB — BASIC METABOLIC PANEL
Anion gap: 7 (ref 5–15)
BUN: 12 mg/dL (ref 8–23)
CO2: 24 mmol/L (ref 22–32)
Calcium: 8.9 mg/dL (ref 8.9–10.3)
Chloride: 105 mmol/L (ref 98–111)
Creatinine, Ser: 0.7 mg/dL (ref 0.61–1.24)
GFR, Estimated: >60 mL/min (ref >60)
Glucose, Bld: 125 mg/dL — ABNORMAL HIGH (ref 70–99)
Potassium: 3.6 mmol/L (ref 3.5–5.1)
Sodium: 136 mmol/L (ref 135–145)

## 2022-09-17 LAB — CANCER ANTIGEN 19-9: CA 19-9: 4718 U/mL — ABNORMAL HIGH (ref 0–35)

## 2022-09-17 LAB — MAGNESIUM: Magnesium: 1.7 mg/dL (ref 1.7–2.4)

## 2022-09-17 LAB — PROTIME-INR
INR: 1.3 — ABNORMAL HIGH (ref 0.8–1.2)
Prothrombin Time: 16.1 seconds — ABNORMAL HIGH (ref 11.4–15.2)

## 2022-09-17 MED ORDER — PROSOURCE PLUS PO LIQD
30.0000 mL | Freq: Three times a day (TID) | ORAL | Status: DC
  Administered 2022-09-17 – 2022-09-18 (×2): 30 mL via ORAL
  Filled 2022-09-17 (×4): qty 30

## 2022-09-17 MED ORDER — METOPROLOL TARTRATE 50 MG PO TABS
50.0000 mg | ORAL_TABLET | Freq: Two times a day (BID) | ORAL | Status: DC
  Administered 2022-09-17 – 2022-09-18 (×3): 50 mg via ORAL
  Filled 2022-09-17 (×5): qty 1

## 2022-09-17 MED ORDER — METOPROLOL TARTRATE 25 MG PO TABS
37.5000 mg | ORAL_TABLET | Freq: Two times a day (BID) | ORAL | Status: DC
  Administered 2022-09-17: 37.5 mg via ORAL

## 2022-09-17 MED ORDER — DILTIAZEM HCL 30 MG PO TABS
60.0000 mg | ORAL_TABLET | Freq: Four times a day (QID) | ORAL | Status: DC
  Administered 2022-09-17 – 2022-09-18 (×4): 60 mg via ORAL
  Filled 2022-09-17 (×4): qty 2

## 2022-09-17 NOTE — Progress Notes (Signed)
Patient has home CPAP unit.  Patient self manages.

## 2022-09-17 NOTE — Progress Notes (Signed)
Initial Nutrition Assessment  DOCUMENTATION CODES:   Morbid obesity  INTERVENTION:  Agree with Heart Healthy diet   Snack from nursing nourishment room  NUTRITION DIAGNOSIS:   Inadequate oral intake related to decreased appetite (taste changes) as evidenced by per patient/family report.  GOAL:  Patient will meet greater than or equal to 90% of their needs   MONITOR:  Labs, I & O's, Weight trends, PO intake  REASON FOR ASSESSMENT:   Malnutrition Screening Tool    ASSESSMENT: Patient is a 62 yo male with hx of skin cancer, GERD, HTN and asthma  who presents with shortness of breath , abdominal pain, leukocytosis.   CT -abdomen, pelvis shows pancreatic mass (new finding) but no infection per MD.  Oncology consulted.  Patient scheduled for liver lesion biopsy on 3/29 at [facility]. NPO after midnight. Patient affirms he likes the food but appetite is not at usual and has experienced taste changes the past 2 weeks. Meal completions 50-75% range. Nursing has been providing snacks (ice cream and peanut butter and crackers) as desired. Encouraged consistent nutrition intake through the day and protein with each meal.    Patient weight is down 6.2 kg (3.9%) from 159.7 kg in January to current 153.5 kg. BMI-40.1 morbidly obese.   Medications reviewed.   IV- NS@70  ml/hr.      Latest Ref Rng & Units 09/17/2022    4:30 AM 09/16/2022    4:31 AM 09/15/2022    9:02 AM  BMP  Glucose 70 - 99 mg/dL 125  105  119   BUN 8 - 23 mg/dL 12  12  15    Creatinine 0.61 - 1.24 mg/dL 0.70  0.67  0.58   Sodium 135 - 145 mmol/L 136  137  133   Potassium 3.5 - 5.1 mmol/L 3.6  3.8  3.9   Chloride 98 - 111 mmol/L 105  106  103   CO2 22 - 32 mmol/L 24  24  22    Calcium 8.9 - 10.3 mg/dL 8.9  9.2  9.1       NUTRITION - FOCUSED PHYSICAL EXAM:  Flowsheet Row Most Recent Value  Orbital Region No depletion  Upper Arm Region No depletion  Thoracic and Lumbar Region No depletion  Buccal Region No depletion   Temple Region No depletion  Clavicle Bone Region No depletion  Clavicle and Acromion Bone Region No depletion  Scapular Bone Region No depletion  Dorsal Hand No depletion  Patellar Region No depletion  Anterior Thigh Region No depletion  Posterior Calf Region No depletion  Edema (RD Assessment) Mild  Hair Reviewed  Eyes Reviewed  Mouth Reviewed  Skin Reviewed  Nails Reviewed      Diet Order:   Diet Order             Diet NPO time specified  Diet effective midnight           Diet Heart Room service appropriate? Yes; Fluid consistency: Thin  Diet effective now                   EDUCATION NEEDS:    Skin:  Skin Assessment: Reviewed RN Assessment  Last BM:  3/27 medium  Height:   Ht Readings from Last 1 Encounters:  09/15/22 6\' 5"  (1.956 m)    Weight:   Wt Readings from Last 1 Encounters:  09/17/22 (!) 153.5 kg    Ideal Body Weight:   95 kg  BMI:  Body mass index is 40.13 kg/m.  Estimated Nutritional Needs:   Kcal:  2300-2500  Protein:  133-140 gr  Fluid:  2.3-2.5 liters daily  Colman Cater MS,RD,CSG,LDN Contact: Shea Evans

## 2022-09-17 NOTE — Consult Note (Signed)
Chief Complaint: Patient was seen in consultation today for liver lesion biopsy Chief Complaint  Patient presents with   Shortness of Breath   at the request of Dr Murvin Natal  Referring Physician(s): Dr Vickey Huger  Supervising Physician: Markus Daft  Patient Status: AP IP  History of Present Illness: Craig Cole is a 62 y.o. male   Hx skin cancer; HTN; HLD Not feeling well for weeks/few months Abd pain; SOB; constipation Wt loss Was to be seen by GI MD but came to ED since symptoms were worsening  CT 3/26:  IMPRESSION: 1. Hypodense mass arising from the tip of the pancreatic tail and involving the closely adjacent splenic hilum measuring 6.0 x 5.0 cm consistent with primary pancreatic adenocarcinoma. 2. Numerous hypodense metastatic lesions throughout the liver. 3. Severe hepatomegaly and splenomegaly. 4. 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  DR Delton Coombes has seen and examined pt Requesting liver lesion biopsy Dr Anselm Pancoast has reviewed imaging Approves procedure  Known Afib with RVR- stable  Initially started on diltiazem drip and now transitioned to oral diltiazem and oral metoprolol   Past Medical History:  Diagnosis Date   Cancer ([place])    skin cancer   GERD (gastroesophageal reflux disease)    Glaucoma    High risk sexual behavior    on PrEP for HIV   Hyperlipidemia    Hypertension    Sleep apnea     Past Surgical History:  Procedure Laterality Date   COLONOSCOPY  05/25/2012   [facility]; rare diverticulum.   COLONOSCOPY WITH PROPOFOL N/A 04/28/2021   Procedure: COLONOSCOPY WITH PROPOFOL;  Surgeon: Daneil Dolin, MD;  Location: [facility];  Service: Endoscopy;  Laterality: N/A;  8:45am   POLYPECTOMY  04/28/2021   Procedure: POLYPECTOMY;  Surgeon: Daneil Dolin, MD;  Location: [facility];  Service: Endoscopy;;   RETINAL DETACHMENT SURGERY Left     TONSILLECTOMY      Allergies: Patient has no known allergies.  Medications: Prior to Admission medications   Medication Sig Start Date End Date Taking? Authorizing Provider  aspirin EC 81 MG tablet Take 81 mg by mouth every evening.   Yes [provider]  DILT-XR 240 MG 24 hr capsule Take 240 mg by mouth daily. 06/29/22  Yes [provider]  emtricitabine-tenofovir AF (DESCOVY) 200-25 MG tablet Take 1 tablet by mouth daily. 09/01/22  Yes Kuppelweiser, Cassie L, RPH-CPP  esomeprazole (NEXIUM) 40 MG capsule Take 40 mg by mouth every morning. 05/31/19  Yes [provider]  furosemide (LASIX) 20 MG tablet TAKE 1 TABLET BY MOUTH DAILY AS NEEDED FOR LOWER LEG SWELLING 06/02/21  Yes [provider]  hydrALAZINE (APRESOLINE) 25 MG tablet Take 25 mg by mouth 2 (two) times daily. 01/27/21  Yes [provider]  lisinopril (ZESTRIL) 40 MG tablet Take 40 mg by mouth in the morning. 06/27/20  Yes [provider]  montelukast (SINGULAIR) 10 MG tablet Take 10 mg by mouth at bedtime. 05/31/19  Yes [provider]  Multiple Vitamins-Minerals (ZINC PO) Take 1 tablet by mouth daily.   Yes [provider]  ondansetron (ZOFRAN-ODT) 4 MG disintegrating tablet Take 1 tablet (4 mg total) by mouth every 8 (eight) hours as needed for nausea or vomiting. 09/06/22  Yes Leath-Warren, Alda Lea, NP  promethazine-dextromethorphan (PROMETHAZINE-DM) 6.25-15 MG/5ML syrup Take 5 mLs by mouth at bedtime as needed for cough. Do not take with alcohol  or while driving or operating heavy machinery.  May cause drowsiness. 08/26/22  Yes Eulogio Bear, NP  simvastatin (ZOCOR) 40 MG tablet Take 40 mg by mouth at bedtime. 05/31/19  Yes [provider]  tadalafil (CIALIS) 5 MG tablet Take 5 mg by mouth in the morning. 09/27/20  Yes [provider]  Tafluprost, PF, 0.0015 % SOLN Place 1 drop into both eyes at bedtime.   Yes [provider]  tamsulosin  (FLOMAX) 0.4 MG CAPS capsule Take 0.4 mg by mouth at bedtime. 05/31/19  Yes [provider]  benzonatate (TESSALON) 100 MG capsule Take 1 capsule (100 mg total) by mouth 3 (three) times daily as needed for cough. Do not take with alcohol or while driving or operating heavy machinery.  May cause drowsiness. Patient not taking: Reported on 09/15/2022 08/26/22   Eulogio Bear, NP     Family History  Problem Relation Age of Onset   Hypertension Mother    Diabetes Mother    Myasthenia gravis Father    Prostate cancer Brother    Colon cancer Neg Hx     Social History   Socioeconomic History   Marital status: Single    Spouse name: Not on file   Number of children: Not on file   Years of education: Not on file   Highest education level: Not on file  Occupational History   Not on file  Tobacco Use   Smoking status: Former    Packs/day: 1    Types: Cigarettes    Quit date: 1999    Years since quitting: 25.2   Smokeless tobacco: Never  Vaping Use   Vaping Use: Never used  Substance and Sexual Activity   Alcohol use: Not Currently    Comment: rare   Drug use: Never   Sexual activity: Not Currently  Other Topics Concern   Not on file  Social History Narrative   Not on file   Social Determinants of Health   Financial Resource Strain: Not on file  Food Insecurity: No Food Insecurity (09/15/2022)   Hunger Vital Sign    Worried About Running Out of Food in the Last Year: Never true    Ran Out of Food in the Last Year: Never true  Transportation Needs: No Transportation Needs (09/15/2022)   PRAPARE - Hydrologist (Medical): No    Lack of Transportation (Non-Medical): No  Physical Activity: Not on file  Stress: Not on file  Social Connections: Not on file    Review of Systems: A 12 point ROS discussed and pertinent positives are indicated in the HPI above.  All other systems are negative.  Review of Systems  Constitutional:  Positive  for activity change, appetite change and fatigue.  Respiratory:  Positive for shortness of breath. Negative for cough and wheezing.   Cardiovascular:  Negative for chest pain.  Gastrointestinal:  Positive for abdominal pain, constipation and nausea.  Psychiatric/Behavioral:  Negative for behavioral problems and confusion.     Vital Signs: BP (!) 133/96 (BP Location: Left Arm)   Pulse 90   Temp (!) 96.4 F (35.8 C)   Resp 20   Ht 6\' 5"  (1.956 m)   Wt (!) 338 lb 6.5 oz (153.5 kg)   SpO2 97%   BMI 40.13 kg/m     Physical Exam Vitals reviewed.  Cardiovascular:     Rate and Rhythm: Normal rate. Rhythm irregular.  Pulmonary:     Effort: Pulmonary effort  is normal.     Breath sounds: Normal breath sounds. No wheezing.  Musculoskeletal:        General: Normal range of motion.  Skin:    General: Skin is warm.  Neurological:     Mental Status: He is alert and oriented to person, place, and time.  Psychiatric:        Behavior: Behavior normal.     Imaging: ECHOCARDIOGRAM COMPLETE  Result Date: 09/16/2022    ECHOCARDIOGRAM REPORT   Patient Name:   Craig Cole Date of Exam: 09/16/2022 Medical Rec #:  [id number]     Height:       77.0 in Accession #:    [id number]    Weight:       332.9 lb Date of Birth:  1960/11/16     BSA:          2.778 m Patient Age:    45 years      BP:           112/67 mmHg Patient Gender: M             HR:           92 bpm. Exam Location:  [facility] Procedure: 2D Echo, Color Doppler, Cardiac Doppler and Intracardiac            Opacification Agent Indications:    Atrial Fibrillation I48.91  History:        Patient has no prior history of Echocardiogram examinations.                 Arrythmias:Atrial Fibrillation; Risk Factors:Hypertension,                 Dyslipidemia, Former Smoker and Sleep Apnea.  Sonographer:    Greer Pickerel Referring Phys: [id number] ASIA B Advance  Sonographer Comments: No subcostal window. Image acquisition challenging due to patient body  habitus and Image acquisition challenging due to respiratory motion. IMPRESSIONS  1. Left ventricular ejection fraction, by estimation, is 55 to 60%. The left ventricle has normal function. The left ventricle has no regional wall motion abnormalities. There is mild left ventricular hypertrophy. Left ventricular diastolic parameters are indeterminate.  2. RV-RA gradient 25 mmHg suggesting normal estimated RVSP presuming normal CVP. Right ventricular systolic function is normal. The right ventricular size is normal.  3. The mitral valve is grossly normal. Trivial mitral valve regurgitation.  4. The aortic valve is tricuspid. There is mild calcification of the aortic valve. Aortic valve regurgitation is not visualized. Aortic valve sclerosis is present, with no evidence of aortic valve stenosis.  5. Unable to estimate CVP. Comparison(s): No prior Echocardiogram. FINDINGS  Left Ventricle: Left ventricular ejection fraction, by estimation, is 55 to 60%. The left ventricle has normal function. The left ventricle has no regional wall motion abnormalities. Definity contrast agent was given IV to delineate the left ventricular  endocardial borders. The left ventricular internal cavity size was normal in size. There is mild left ventricular hypertrophy. Abnormal (paradoxical) septal motion, consistent with left bundle branch block. Left ventricular diastolic function could not be evaluated due to atrial fibrillation. Left ventricular diastolic parameters are indeterminate. Right Ventricle: RV-RA gradient 25 mmHg suggesting normal estimated RVSP presuming normal CVP. The right ventricular size is normal. No increase in right ventricular wall thickness. Right ventricular systolic function is normal. Left Atrium: Left atrial size was normal in size. Right Atrium: Right atrial size was normal in size. Pericardium: There is no evidence of pericardial effusion. Mitral Valve:  The mitral valve is grossly normal. Trivial mitral valve  regurgitation. Tricuspid Valve: The tricuspid valve is grossly normal. Tricuspid valve regurgitation is mild. Aortic Valve: The aortic valve is tricuspid. There is mild calcification of the aortic valve. Aortic valve regurgitation is not visualized. Aortic valve sclerosis is present, with no evidence of aortic valve stenosis. Pulmonic Valve: The pulmonic valve was grossly normal. Pulmonic valve regurgitation is trivial. Aorta: The aortic root is normal in size and structure. Venous: Unable to estimate CVP. The inferior vena cava was not well visualized. IAS/Shunts: No atrial level shunt detected by color flow Doppler.  LEFT VENTRICLE PLAX 2D LVIDd:         4.80 cm   Diastology LVIDs:         3.20 cm   LV e' medial:    11.20 cm/s LV PW:         1.20 cm   LV E/e' medial:  9.9 LV IVS:        0.90 cm   LV e' lateral:   12.10 cm/s LVOT diam:     2.40 cm   LV E/e' lateral: 9.2 LV SV:         68 LV SV Index:   25 LVOT Area:     4.52 cm  RIGHT VENTRICLE RV S prime:     15.20 cm/s TAPSE (M-mode): 1.9 cm LEFT ATRIUM             Index        RIGHT ATRIUM           Index LA diam:        5.20 cm 1.87 cm/m   RA Area:     18.00 cm LA Vol (A2C):   52.5 ml 18.90 ml/m  RA Volume:   41.60 ml  14.98 ml/m LA Vol (A4C):   75.8 ml 27.29 ml/m LA Biplane Vol: 69.6 ml 25.06 ml/m  AORTIC VALVE             PULMONIC VALVE LVOT Vmax:   99.00 cm/s  PR End Diast Vel: 1.52 msec LVOT Vmean:  66.800 cm/s LVOT VTI:    0.151 m  AORTA Ao Root diam: 3.80 cm Ao Asc diam:  3.90 cm MITRAL VALVE                TRICUSPID VALVE MV Area (PHT): 4.49 cm     TR Peak grad:   25.0 mmHg MV Decel Time: 169 msec     TR Vmax:        250.00 cm/s MR Peak grad: 20.5 mmHg MR Vmax:      226.45 cm/s   SHUNTS MV E velocity: 111.00 cm/s  Systemic VTI:  0.15 m MV A velocity: 42.20 cm/s   Systemic Diam: 2.40 cm MV E/A ratio:  2.63 Rozann Lesches MD Electronically signed by Rozann Lesches MD Signature Date/Time: 09/16/2022/11:13:36 AM    Final    CT ABDOMEN PELVIS W  CONTRAST  Result Date: 09/15/2022 CLINICAL DATA:  Abdominal pain, shortness of breath, lethargy and loss of appetite for 3 months * Tracking Code: BO * EXAM: CT ABDOMEN AND PELVIS WITH CONTRAST TECHNIQUE: Multidetector CT imaging of the abdomen and pelvis was performed using the standard protocol following bolus administration of intravenous contrast. RADIATION DOSE REDUCTION: This exam was performed according to the departmental dose-optimization program which includes automated exposure control, adjustment of the mA and/or kV according to patient size and/or use of iterative reconstruction technique. CONTRAST:  158mL  OMNIPAQUE IOHEXOL 350 MG/ML SOLN COMPARISON:  None Available. FINDINGS: Lower chest: Please see separately reported examination of the chest. Hepatobiliary: Numerous hypodense lesions throughout the liver, largest lesion in the anterior liver dome, hepatic segment VIII measuring 4.8 x 4.2 cm (series 4, image 43). Severe hepatomegaly, maximum coronal span 26.5 cm (series 8, image 64). No gallstones, gallbladder wall thickening, or biliary dilatation. Pancreas: Hypodense mass arising from the tip of the pancreatic tail and involving the closely adjacent splenic hilum measuring 6.0 x 5.0 cm (series 4, image 40). No pancreatic ductal dilatation or surrounding inflammatory changes. Spleen: Splenomegaly, maximum coronal span 16.0 cm. Adrenals/Urinary Tract: Adrenal glands are unremarkable. Simple, benign bilateral renal cortical cysts, for which no further follow-up or characterization is required. Kidneys are otherwise normal, without renal calculi, solid lesion, or hydronephrosis. Bladder is unremarkable. Stomach/Bowel: Stomach is within normal limits. Appendix appears normal. No evidence of bowel wall thickening, distention, or inflammatory changes. Vascular/Lymphatic: Aortic atherosclerosis. 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 No enlarged  abdominal or pelvic lymph nodes. Reproductive: No mass or other significant abnormality. Other: No abdominal wall hernia or abnormality. No ascites. Musculoskeletal: No acute or significant osseous findings. IMPRESSION: 1. Hypodense mass arising from the tip of the pancreatic tail and involving the closely adjacent splenic hilum measuring 6.0 x 5.0 cm consistent with primary pancreatic adenocarcinoma. 2. Numerous hypodense metastatic lesions throughout the liver. 3. Severe hepatomegaly and splenomegaly. 4. 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 Aortic Atherosclerosis (ICD10-I70.0). Electronically Signed   By: Delanna Ahmadi M.D.   On: 09/15/2022 12:50   CT Angio Chest PE W and/or Wo Contrast  Result Date: 09/15/2022 CLINICAL DATA:  PE suspected, shortness of breath for 3 months * Tracking Code: BO * EXAM: CT ANGIOGRAPHY CHEST WITH CONTRAST TECHNIQUE: Multidetector CT imaging of the chest was performed using the standard protocol during bolus administration of intravenous contrast. Multiplanar CT image reconstructions and MIPs were obtained to evaluate the vascular anatomy. RADIATION DOSE REDUCTION: This exam was performed according to the departmental dose-optimization program which includes automated exposure control, adjustment of the mA and/or kV according to patient size and/or use of iterative reconstruction technique. CONTRAST:  158mL OMNIPAQUE IOHEXOL 350 MG/ML SOLN COMPARISON:  None Available. FINDINGS: Cardiovascular: Satisfactory opacification of the pulmonary arteries to the segmental level. No evidence of pulmonary embolism. Normal heart size. Three-vessel coronary artery calcifications. No pericardial effusion. Aortic atherosclerosis. Mediastinum/Nodes: No enlarged mediastinal, hilar, or axillary lymph nodes. Thyroid gland, trachea, and esophagus demonstrate no significant findings. Lungs/Pleura: Trace right pleural effusion with associated  scarring and or atelectasis of the right lung base. Upper Abdomen: Numerous hypodense lesions throughout the liver as well as a mass of the pancreatic tail (series 07/24/2003). Musculoskeletal: No chest wall abnormality. No acute osseous findings. Review of the MIP images confirms the above findings. IMPRESSION: 1. Negative examination for pulmonary embolism. 2. Trace right pleural effusion with associated scarring and or atelectasis of the right lung base. 3. Numerous hypodense lesions throughout the liver as well as a mass of the pancreatic tail. Findings are highly concerning for primary pancreatic malignancy and hepatic metastatic disease; abdominal findings further discussed on forthcoming dedicated, separately reported examination of the abdomen and pelvis. 4. Coronary artery disease. Aortic Atherosclerosis (ICD10-I70.0). Electronically Signed   By: Delanna Ahmadi M.D.   On: 09/15/2022 12:44   DG Chest 2 View  Result Date: 08/25/2022 CLINICAL DATA:  Cough, RIGHT lower chest pain, shortness of breath EXAM: CHEST - 2 VIEW COMPARISON:  Chest x-ray dated 07/05/2020 FINDINGS: Heart size and mediastinal contours are stable. Lungs are clear. No pleural effusion or pneumothorax is seen. Osseous structures about the chest are unremarkable. Stable elevation of the RIGHT hemidiaphragm. IMPRESSION: No active cardiopulmonary disease. No evidence of pneumonia or pulmonary edema. Electronically Signed   By: Franki Cabot M.D.   On: 08/25/2022 16:00    Labs:  CBC: Recent Labs    09/15/22 0902 09/16/22 0431 09/17/22 0430  WBC 21.5* 19.7* 23.3*  HGB 14.0 12.2* 11.4*  HCT 44.3 39.3 36.1*  PLT 179 174 197    COAGS: Recent Labs    09/15/22 1359  INR 1.3*    BMP: Recent Labs    08/31/22 1136 09/15/22 0902 09/16/22 0431 09/17/22 0430  NA 142 133* 137 136  K 4.0 3.9 3.8 3.6  CL 108 103 106 105  CO2 21 22 24 24   GLUCOSE 106* 119* 105* 125*  BUN 19 15 12 12   CALCIUM 9.4 9.1 9.2 8.9  CREATININE  0.78 0.58* 0.67 0.70  GFRNONAA  --  >60 >60 >60    LIVER FUNCTION TESTS: Recent Labs    09/15/22 0902 09/16/22 0431  BILITOT 1.2 1.1  AST 44* 42*  ALT 39 37  ALKPHOS 165* 173*  PROT 5.9* 5.7*  ALBUMIN 2.7* 2.5*    TUMOR MARKERS: No results for input(s): "AFPTM", "CEA", "CA199", "CHROMGRNA" in the last 8760 hours.  Assessment and Plan:  Abd pain and short of breath Wt loss CT revealing pancreatic mass and liver lesions Scheduled for liver lesion biopsy at [facility] 3/29 Risks and benefits of liver lesion biopsy was discussed with the patient and/or patient's family including, but not limited to bleeding, infection, damage to adjacent structures or low yield requiring additional tests.  All of the questions were answered and there is agreement to proceed Consent signed and in chart.  Thank you for this interesting consult.  I greatly enjoyed meeting Pradeep Keirsey and look forward to participating in their care.  A copy of this report was sent to the requesting provider on this date.  Electronically Signed: Lavonia Drafts, PA-C 09/17/2022, 9:54 AM   I spent a total of 20 Minutes    in face to face in clinical consultation, greater than 50% of which was counseling/coordinating care for liver lesion biopsy

## 2022-09-17 NOTE — Progress Notes (Signed)
PROGRESS NOTE   Levorn Elsmore  [id number] DOB: 1960-12-12 DOA: 09/15/2022 PCP: Carrolyn Meiers, MD   Chief Complaint  Patient presents with   Shortness of Breath   Level of care: Telemetry  Brief Admission History:   62 y.o. male with medical history significant of Skin cancer, GERD, hyperlipidemia, hypertension, presents the ED with a chief complaint of not feeling well. Patient reports this started 2 to 3 months ago shortness of breath.  Patient reports that he first noticed it while singing, as he is a Scientist, clinical (histocompatibility and immunogenetics).  He reports that he noticed he could not finish as many songs and he was feeling completely exhausted while singing.  He then noticed some rib pain with breathing.  He had a cough that was productive of clear sputum but has since gotten better.  Patient denies any chest pain, palpitations, audible wheezing.  He does have a history of asthma.  He used to have a rescue inhaler but after being started on Singulair he did not need the rescue inhaler, and has not been on it since.  Ports that his symptoms then became more related to gastrointestinal tract.  He saw his GI doctor was recommended to take a daily laxative.  He took his daily laxative he reports his abdominal pain went away.  Prior to the daily laxative his pain was described as sharp in the right upper quadrant and left upper quadrant.  It was intermittent.  Eating had no effect on it.  Patient has not had an appetite though and has lost some weight.  Patient reports that he noticed that some foods, i.e. hamburger, did provoke the pain, but eating in general did not make it worse.  Patient reports she has had hot flashes during the day due to cold sweats at night.  He reports he takes ibuprofen before bed and attributes waking up in a cold sweat to his fever breaking due to the ibuprofen.  The highest measured temp he had at home is 100.0.  Patient reports he is also had some nausea but no vomiting.  He has not had any  hematochezia or melena.  His last normal meal was so long ago that he cannot remember.  This morning for breakfast he had 2 eggs and then he had for lunch some crackers in the ED.  His last normal bowel movement was day before yesterday.  He reports he has not been taking laxative the past couple days because he has not felt good enough.   Patient reports that in 1999 there was a mention of an irregular heartbeat during her hospitalization.  Since then he was following with cardiologist and was not told that he had an irregular heartbeat or atrial fibrillation again.  Patient reports his last visit with cardiology was prior to him moving to this area as it was before 2020.  Patient reports he does not feel palpitations or chest pain.   Patient does report that at the beginning of last week he thought he was getting better, and felt better for 2 weeks.  Then this past week he started getting worse again.  He was going to try to hold out to see his specialist, but he has not seen pulmonology until 17 April and he does not see GI until 29th.  You decided he could not wait that long and came into the ED today.   Patient does not smoke, does not drink, does not use illicit drugs.  He is vaccinated for COVID and  flu.  Patient started.   Assessment and Plan: * Atrial fibrillation with RVR (HCC) - With associated dyspnea on exertion - Initially started on diltiazem drip and now transitioned to oral diltiazem and oral metoprolol  - Holding off on anticoagulation at this time as oncology is recommending IR guided liver biopsy - CHA2DS2-VASc 1 - Echo pending - Monitor and correct electrolytes as indicated - Troponin 7, 7 - No signs of infection - Check TSH---> 2.127 - Continue to monitor -- continue diltiazem 60 mg Q6h and metoprolol 50 mg BID   Leukocytosis - Significant leukocytosis at 21.5 without an obvious sign of infection, WBC trending down slowly - COVID and flu negative - UA is not indicative  of UTI - CT chest abdomen pelvis shows a pancreatic mass but no signs of infection - Lactic acid normal x 2 - Procalcitonin 0.28  - Continue to monitor  Asthma, chronic - Continue Singulair - Albuterol as needed - Continue to monitor  GERD (gastroesophageal reflux disease) - Continue PPI  Hyperlipidemia - Continue statin  Essential hypertension - now on diltiazem 60 mg Q6h and oral metoprolol 50 mg BID     Pancreatic mass - New finding on CT scan - Dr. Raliegh Ip was consulted from the ED recommended a CA 19-9, and a IR ultrasound-guided biopsy of the liver - Heart healthy diet now, n.p.o. after midnight - plan for IR biopsy of liver 09/18/22 -- pt to transport to [facility] and then return to [facility] after procedure    DVT prophylaxis: SCDs Code Status: Full  Family Communication:  Disposition: Status is: Inpatient Remains inpatient appropriate because: uncontrolled Afib RVR    Consultants:  IR radiology Oncology Delton Coombes)  Procedures:   Antimicrobials:    Subjective: Pt without c/o, denies palpitations.      Objective: Vitals:   09/17/22 0015 09/17/22 0558 09/17/22 1419 09/17/22 1759  BP: 127/83 (!) 133/96 (!) 139/90 (!) 166/96  Pulse: (!) 107 90 94   Resp: 20 20 (!) 24   Temp: 98.7 F (37.1 C) (!) 96.4 F (35.8 C) 98.6 F (37 C)   TempSrc:   Oral   SpO2: 97% 97% 96%   Weight:  (!) 153.5 kg    Height:        Intake/Output Summary (Last 24 hours) at 09/17/2022 1819 Last data filed at 09/17/2022 1544 Gross per 24 hour  Intake 1837.26 ml  Output --  Net 1837.26 ml   Filed Weights   09/15/22 2044 09/16/22 0515 09/17/22 0558  Weight: (!) 152.4 kg (!) 151 kg (!) 153.5 kg   Examination:  General exam: Appears calm and comfortable  Respiratory system: Clear to auscultation. Respiratory effort normal. Cardiovascular system: tachycardic irregularly irregular rate, normal S1 & S2 heard. No JVD, murmurs, rubs, gallops or clicks. trace pedal edema. Gastrointestinal  system: Abdomen is nondistended, soft and nontender. No organomegaly or masses felt. Normal bowel sounds heard. Central nervous system: Alert and oriented. No focal neurological deficits. Extremities: Symmetric 5 x 5 power. Skin: No rashes, lesions or ulcers. Psychiatry: Judgement and insight appear normal. Mood & affect appropriate.   Data Reviewed: I have personally reviewed following labs and imaging studies  CBC: Recent Labs  Lab 09/15/22 0902 09/16/22 0431 09/17/22 0430  WBC 21.5* 19.7* 23.3*  NEUTROABS 16.7* 15.0* 17.9*  HGB 14.0 12.2* 11.4*  HCT 44.3 39.3 36.1*  MCV 85.9 87.5 87.2  PLT 179 174 [id number]    Basic Metabolic Panel: Recent Labs  Lab 09/15/22 0902 09/16/22 0431  09/17/22 0430  NA 133* 137 136  K 3.9 3.8 3.6  CL 103 106 105  CO2 22 24 24   GLUCOSE 119* 105* 125*  BUN 15 12 12   CREATININE 0.58* 0.67 0.70  CALCIUM 9.1 9.2 8.9  MG 1.9 2.0 1.7    CBG: No results for input(s): "GLUCAP" in the last 168 hours.  Recent Results (from the past 240 hour(s))  Resp panel by RT-PCR (RSV, Flu A&B, Covid) Anterior Nasal Swab     Status: None   Collection Time: 09/15/22  9:29 AM   Specimen: Anterior Nasal Swab  Result Value Ref Range Status   SARS Coronavirus 2 by RT PCR NEGATIVE NEGATIVE Final    Comment: (NOTE) SARS-CoV-2 target nucleic acids are NOT DETECTED.  The SARS-CoV-2 RNA is generally detectable in upper respiratory specimens during the acute phase of infection. The lowest concentration of SARS-CoV-2 viral copies this assay can detect is 138 copies/mL. A negative result does not preclude SARS-Cov-2 infection and should not be used as the sole basis for treatment or other patient management decisions. A negative result may occur with  improper specimen collection/handling, submission of specimen other than nasopharyngeal swab, presence of viral mutation(s) within the areas targeted by this assay, and inadequate number of viral copies(<138 copies/mL). A  negative result must be combined with clinical observations, patient history, and epidemiological information. The expected result is Negative.  Fact Sheet for Patients:  EntrepreneurPulse.com.au  Fact Sheet for Healthcare Providers:  IncredibleEmployment.be  This test is no t yet approved or cleared by the [place] FDA and  has been authorized for detection and/or diagnosis of SARS-CoV-2 by FDA under an Emergency Use Authorization (EUA). This EUA will remain  in effect (meaning this test can be used) for the duration of the COVID-19 declaration under Section 564(b)(1) of the Act, 21 U.S.C.section 360bbb-3(b)(1), unless the authorization is terminated  or revoked sooner.       Influenza A by PCR NEGATIVE NEGATIVE Final   Influenza B by PCR NEGATIVE NEGATIVE Final    Comment: (NOTE) The Xpert Xpress SARS-CoV-2/FLU/RSV plus assay is intended as an aid in the diagnosis of influenza from Nasopharyngeal swab specimens and should not be used as a sole basis for treatment. Nasal washings and aspirates are unacceptable for Xpert Xpress SARS-CoV-2/FLU/RSV testing.  Fact Sheet for Patients: EntrepreneurPulse.com.au  Fact Sheet for Healthcare Providers: IncredibleEmployment.be  This test is not yet approved or cleared by the [place] FDA and has been authorized for detection and/or diagnosis of SARS-CoV-2 by FDA under an Emergency Use Authorization (EUA). This EUA will remain in effect (meaning this test can be used) for the duration of the COVID-19 declaration under Section 564(b)(1) of the Act, 21 U.S.C. section 360bbb-3(b)(1), unless the authorization is terminated or revoked.     Resp Syncytial Virus by PCR NEGATIVE NEGATIVE Final    Comment: (NOTE) Fact Sheet for Patients: EntrepreneurPulse.com.au  Fact Sheet for Healthcare  Providers: IncredibleEmployment.be  This test is not yet approved or cleared by the [place] FDA and has been authorized for detection and/or diagnosis of SARS-CoV-2 by FDA under an Emergency Use Authorization (EUA). This EUA will remain in effect (meaning this test can be used) for the duration of the COVID-19 declaration under Section 564(b)(1) of the Act, 21 U.S.C. section 360bbb-3(b)(1), unless the authorization is terminated or revoked.  Performed at [facility], [address], [place], [place] [address]   Blood Culture (routine x 2)     Status: None (  Preliminary result)   Collection Time: 09/15/22  1:59 PM   Specimen: BLOOD LEFT HAND  Result Value Ref Range Status   Specimen Description BLOOD LEFT HAND  Final   Special Requests   Final    BOTTLES DRAWN AEROBIC AND ANAEROBIC Blood Culture adequate volume   Culture   Final    NO GROWTH 2 DAYS Performed at [facility], [address] [place], [place] [address]    Report Status PENDING  Incomplete  Blood Culture (routine x 2)     Status: None (Preliminary result)   Collection Time: 09/15/22  2:07 PM   Specimen: BLOOD LEFT ARM  Result Value Ref Range Status   Specimen Description BLOOD LEFT ARM  Final   Special Requests   Final    BOTTLES DRAWN AEROBIC AND ANAEROBIC Blood Culture adequate volume   Culture   Final    NO GROWTH 2 DAYS Performed at [facility], [address] [place], [place] [address]    Report Status PENDING  Incomplete     Radiology Studies: ECHOCARDIOGRAM COMPLETE  Result Date: 09/16/2022    ECHOCARDIOGRAM REPORT   Patient Name:   FILEMON GIMLIN Date of Exam: 09/16/2022 Medical Rec #:  [id number]     Height:       77.0 in Accession #:    [id number]    Weight:       332.9 lb Date of Birth:  [birth year]     BSA:          2.778 m Patient Age:    3 years      BP:           112/67 mmHg Patient Gender: M             HR:           92 bpm. Exam Location:  [facility] Procedure: 2D Echo, Color  Doppler, Cardiac Doppler and Intracardiac            Opacification Agent Indications:    Atrial Fibrillation I48.91  History:        Patient has no prior history of Echocardiogram examinations.                 Arrythmias:Atrial Fibrillation; Risk Factors:Hypertension,                 Dyslipidemia, Former Smoker and Sleep Apnea.  Sonographer:    Greer Pickerel Referring Phys: [id number] ASIA B Breese  Sonographer Comments: No subcostal window. Image acquisition challenging due to patient body habitus and Image acquisition challenging due to respiratory motion. IMPRESSIONS  1. Left ventricular ejection fraction, by estimation, is 55 to 60%. The left ventricle has normal function. The left ventricle has no regional wall motion abnormalities. There is mild left ventricular hypertrophy. Left ventricular diastolic parameters are indeterminate.  2. RV-RA gradient 25 mmHg suggesting normal estimated RVSP presuming normal CVP. Right ventricular systolic function is normal. The right ventricular size is normal.  3. The mitral valve is grossly normal. Trivial mitral valve regurgitation.  4. The aortic valve is tricuspid. There is mild calcification of the aortic valve. Aortic valve regurgitation is not visualized. Aortic valve sclerosis is present, with no evidence of aortic valve stenosis.  5. Unable to estimate CVP. Comparison(s): No prior Echocardiogram. FINDINGS  Left Ventricle: Left ventricular ejection fraction, by estimation, is 55 to 60%. The left ventricle has normal function. The left ventricle has no regional wall motion abnormalities. Definity contrast agent was given IV to  delineate the left ventricular  endocardial borders. The left ventricular internal cavity size was normal in size. There is mild left ventricular hypertrophy. Abnormal (paradoxical) septal motion, consistent with left bundle branch block. Left ventricular diastolic function could not be evaluated due to atrial fibrillation. Left ventricular  diastolic parameters are indeterminate. Right Ventricle: RV-RA gradient 25 mmHg suggesting normal estimated RVSP presuming normal CVP. The right ventricular size is normal. No increase in right ventricular wall thickness. Right ventricular systolic function is normal. Left Atrium: Left atrial size was normal in size. Right Atrium: Right atrial size was normal in size. Pericardium: There is no evidence of pericardial effusion. Mitral Valve: The mitral valve is grossly normal. Trivial mitral valve regurgitation. Tricuspid Valve: The tricuspid valve is grossly normal. Tricuspid valve regurgitation is mild. Aortic Valve: The aortic valve is tricuspid. There is mild calcification of the aortic valve. Aortic valve regurgitation is not visualized. Aortic valve sclerosis is present, with no evidence of aortic valve stenosis. Pulmonic Valve: The pulmonic valve was grossly normal. Pulmonic valve regurgitation is trivial. Aorta: The aortic root is normal in size and structure. Venous: Unable to estimate CVP. The inferior vena cava was not well visualized. IAS/Shunts: No atrial level shunt detected by color flow Doppler.  LEFT VENTRICLE PLAX 2D LVIDd:         4.80 cm   Diastology LVIDs:         3.20 cm   LV e' medial:    11.20 cm/s LV PW:         1.20 cm   LV E/e' medial:  9.9 LV IVS:        0.90 cm   LV e' lateral:   12.10 cm/s LVOT diam:     2.40 cm   LV E/e' lateral: 9.2 LV SV:         68 LV SV Index:   25 LVOT Area:     4.52 cm  RIGHT VENTRICLE RV S prime:     15.20 cm/s TAPSE (M-mode): 1.9 cm LEFT ATRIUM             Index        RIGHT ATRIUM           Index LA diam:        5.20 cm 1.87 cm/m   RA Area:     18.00 cm LA Vol (A2C):   52.5 ml 18.90 ml/m  RA Volume:   41.60 ml  14.98 ml/m LA Vol (A4C):   75.8 ml 27.29 ml/m LA Biplane Vol: 69.6 ml 25.06 ml/m  AORTIC VALVE             PULMONIC VALVE LVOT Vmax:   99.00 cm/s  PR End Diast Vel: 1.52 msec LVOT Vmean:  66.800 cm/s LVOT VTI:    0.151 m  AORTA Ao Root diam: 3.80  cm Ao Asc diam:  3.90 cm MITRAL VALVE                TRICUSPID VALVE MV Area (PHT): 4.49 cm     TR Peak grad:   25.0 mmHg MV Decel Time: 169 msec     TR Vmax:        250.00 cm/s MR Peak grad: 20.5 mmHg MR Vmax:      226.45 cm/s   SHUNTS MV E velocity: 111.00 cm/s  Systemic VTI:  0.15 m MV A velocity: 42.20 cm/s   Systemic Diam: 2.40 cm MV E/A ratio:  2.63 Rozann Lesches MD Electronically  signed by Rozann Lesches MD Signature Date/Time: 09/16/2022/11:13:36 AM    Final     Scheduled Meds:  (feeding supplement) PROSource Plus  30 mL Oral TID BM   atorvastatin  20 mg Oral Daily   Chlorhexidine Gluconate Cloth  6 each Topical Daily   diltiazem  60 mg Oral Q6H   metoprolol tartrate  50 mg Oral BID   montelukast  10 mg Oral QHS   pantoprazole  40 mg Oral Daily   pneumococcal 20-valent conjugate vaccine  0.5 mL Intramuscular Tomorrow-1000   tamsulosin  0.4 mg Oral QHS   Continuous Infusions:  sodium chloride 70 mL/hr at 09/17/22 1544    LOS: 2 days   Time spent: 69 mins  Laqueena Hinchey Wynetta Emery, MD How to contact the [facility] Attending or Consulting provider [place] or covering provider during after hours [place], for this patient?  Check the care team in [facility] and look for a) attending/consulting [facility] provider listed and b) the [facility] team listed Log into [email or web address] and use [place]'s universal password to access. If you do not have the password, please contact the hospital operator. Locate the [facility] provider you are looking for under [facility] and page to a number that you can be directly reached. If you still have difficulty reaching the provider, please page the [facility] (Director on Call) for the Hospitalists listed on amion for assistance.  09/17/2022, 6:19 PM

## 2022-09-18 ENCOUNTER — Ambulatory Visit (HOSPITAL_COMMUNITY)
Admit: 2022-09-18 | Discharge: 2022-09-18 | Disposition: A | Discharge status: Home / Self Care | Payer: No Typology Code available for payment source | Attending: Family Medicine | Admitting: Family Medicine

## 2022-09-18 DIAGNOSIS — G4733 Obstructive sleep apnea (adult) (pediatric): Secondary | ICD-10-CM | POA: Diagnosis not present

## 2022-09-18 DIAGNOSIS — I4891 Unspecified atrial fibrillation: Secondary | ICD-10-CM | POA: Diagnosis not present

## 2022-09-18 DIAGNOSIS — K8689 Other specified diseases of pancreas: Secondary | ICD-10-CM | POA: Diagnosis not present

## 2022-09-18 LAB — CANCER ANTIGEN 19-9: CA 19-9: 4573 U/mL — ABNORMAL HIGH (ref 0–35)

## 2022-09-18 LAB — GLUCOSE, CAPILLARY
Glucose-Capillary: 121 mg/dL — ABNORMAL HIGH (ref 70–99)
Glucose-Capillary: 114 mg/dL — ABNORMAL HIGH (ref 70–99)
Glucose-Capillary: 121 mg/dL — ABNORMAL HIGH (ref 70–99)

## 2022-09-18 MED ORDER — DILTIAZEM HCL ER COATED BEADS 120 MG PO CP24
240.0000 mg | ORAL_CAPSULE | Freq: Every day | ORAL | Status: DC
  Administered 2022-09-18: 240 mg via ORAL
  Filled 2022-09-18 (×2): qty 2

## 2022-09-18 MED ORDER — MIDAZOLAM HCL 2 MG/2ML IJ SOLN
INTRAMUSCULAR | Status: AC
  Filled 2022-09-18: qty 2

## 2022-09-18 MED ORDER — LIDOCAINE HCL (PF) 1 % IJ SOLN
10.0000 mL | Freq: Once | INTRAMUSCULAR | Status: AC
  Administered 2022-09-18: 10 mL via INTRADERMAL

## 2022-09-18 MED ORDER — FLUTICASONE PROPIONATE 50 MCG/ACT NA SUSP
2.0000 | Freq: Every day | NASAL | Status: DC
  Administered 2022-09-18 – 2022-09-19 (×2): 2 via NASAL
  Filled 2022-09-18: qty 16

## 2022-09-18 MED ORDER — MIDAZOLAM HCL 2 MG/2ML IJ SOLN
INTRAMUSCULAR | Status: DC | PRN
  Administered 2022-09-18: 1 mg via INTRAVENOUS

## 2022-09-18 MED ORDER — AMOXICILLIN-POT CLAVULANATE 875-125 MG PO TABS
1.0000 | ORAL_TABLET | Freq: Two times a day (BID) | ORAL | Status: DC
  Administered 2022-09-18 – 2022-09-19 (×2): 1 via ORAL
  Filled 2022-09-18 (×2): qty 1

## 2022-09-18 MED ORDER — FENTANYL CITRATE (PF) 100 MCG/2ML IJ SOLN
INTRAMUSCULAR | Status: AC
  Filled 2022-09-18: qty 2

## 2022-09-18 MED ORDER — EMTRICITABINE-TENOFOVIR AF 200-25 MG PO TABS
1.0000 | ORAL_TABLET | Freq: Every day | ORAL | Status: DC
  Administered 2022-09-19: 1 via ORAL
  Filled 2022-09-18 (×4): qty 1

## 2022-09-18 MED ORDER — FENTANYL CITRATE (PF) 100 MCG/2ML IJ SOLN
INTRAMUSCULAR | Status: DC | PRN
  Administered 2022-09-18 (×2): 25 ug via INTRAVENOUS

## 2022-09-18 NOTE — Progress Notes (Signed)
Put water in patient's machine for patient.  Made sure CPAP was at bedside for patient.  Patient has home machine and will place hisself on.

## 2022-09-18 NOTE — Procedures (Signed)
  Procedure:  [place] core liver lesion biopsy R lobe Preprocedure diagnosis: Pancreatic mass Postprocedure diagnosis: same EBL:    minimal Complications:   none immediate  See full dictation in [facility].  Dillard Cannon MD Main # [phone] Pager  [phone] Mobile [phone]

## 2022-09-18 NOTE — Assessment & Plan Note (Signed)
--   he is on PREP therapy now per his PCP

## 2022-09-18 NOTE — Discharge Instructions (Signed)
Please make appointment to follow up biopsy results with Dr. Delton Coombes as soon as possible;    IMPORTANT INFORMATION: PAY CLOSE ATTENTION   PHYSICIAN DISCHARGE INSTRUCTIONS  Follow with Primary care provider  Carrolyn Meiers, MD  and other consultants as instructed by your Hospitalist Physician  [place], WORSEN OR NEW PROBLEM DEVELOPS   Please note: You were cared for by a hospitalist during your hospital stay. Every effort will be made to forward records to your primary care provider.  You can request that your primary care provider send for your hospital records if they have not received them.  Once you are discharged, your primary care physician will handle any further medical issues. Please note that NO REFILLS for any discharge medications will be authorized once you are discharged, as it is imperative that you return to your primary care physician (or establish a relationship with a primary care physician if you do not have one) for your post hospital discharge needs so that they can reassess your need for medications and monitor your lab values.  Please get a complete blood count and chemistry panel checked by your Primary MD at your next visit, and again as instructed by your Primary MD.  Get Medicines reviewed and adjusted: Please take all your medications with you for your next visit with your Primary MD  Laboratory/radiological data: Please request your Primary MD to go over all hospital tests and procedure/radiological results at the follow up, please ask your primary care provider to get all Hospital records sent to his/her office.  In some cases, they will be blood work, cultures and biopsy results pending at the time of your discharge. Please request that your primary care provider follow up on these results.  If you are diabetic, please bring your blood sugar readings with you to your follow up appointment  with primary care.    Please call and make your follow up appointments as soon as possible.    Also Note the following: If you experience worsening of your admission symptoms, develop shortness of breath, life threatening emergency, suicidal or homicidal thoughts you must seek medical attention immediately by calling 911 or calling your MD immediately  if symptoms less severe.  You must read complete instructions/literature along with all the possible adverse reactions/side effects for all the Medicines you take and that have been prescribed to you. Take any new Medicines after you have completely understood and accpet all the possible adverse reactions/side effects.   Do not drive when taking Pain medications or sleeping medications (Benzodiazepines)  Do not take more than prescribed Pain, Sleep and Anxiety Medications. It is not advisable to combine anxiety,sleep and pain medications without talking with your primary care practitioner  Special Instructions: If you have smoked or chewed Tobacco  in the last 2 yrs please stop smoking, stop any regular Alcohol  and or any Recreational drug use.  Wear Seat belts while driving.  Do not drive if taking any narcotic, mind altering or controlled substances or recreational drugs or alcohol.

## 2022-09-18 NOTE — Progress Notes (Signed)
PROGRESS NOTE   Craig Cole  [id number] DOB: Oct 25, 1960 DOA: 09/15/2022 PCP: Carrolyn Meiers, MD   Chief Complaint  Patient presents with   Shortness of Breath   Level of care: Telemetry  Brief Admission History:   62 y.o. male with medical history significant of Skin cancer, GERD, hyperlipidemia, hypertension, presents the ED with a chief complaint of not feeling well. Patient reports this started 2 to 3 months ago shortness of breath.  Patient reports that he first noticed it while singing, as he is a Scientist, clinical (histocompatibility and immunogenetics).  He reports that he noticed he could not finish as many songs and he was feeling completely exhausted while singing.  He then noticed some rib pain with breathing.  He had a cough that was productive of clear sputum but has since gotten better.  Patient denies any chest pain, palpitations, audible wheezing.  He does have a history of asthma.  He used to have a rescue inhaler but after being started on Singulair he did not need the rescue inhaler, and has not been on it since.  Ports that his symptoms then became more related to gastrointestinal tract.  He saw his GI doctor was recommended to take a daily laxative.  He took his daily laxative he reports his abdominal pain went away.  Prior to the daily laxative his pain was described as sharp in the right upper quadrant and left upper quadrant.  It was intermittent.  Eating had no effect on it.  Patient has not had an appetite though and has lost some weight.  Patient reports that he noticed that some foods, i.e. hamburger, did provoke the pain, but eating in general did not make it worse.  Patient reports she has had hot flashes during the day due to cold sweats at night.  He reports he takes ibuprofen before bed and attributes waking up in a cold sweat to his fever breaking due to the ibuprofen.  The highest measured temp he had at home is 100.0.  Patient reports he is also had some nausea but no vomiting.  He has not had any  hematochezia or melena.  His last normal meal was so long ago that he cannot remember.  This morning for breakfast he had 2 eggs and then he had for lunch some crackers in the ED.  His last normal bowel movement was day before yesterday.  He reports he has not been taking laxative the past couple days because he has not felt good enough.   Patient reports that in 1999 there was a mention of an irregular heartbeat during her hospitalization.  Since then he was following with cardiologist and was not told that he had an irregular heartbeat or atrial fibrillation again.  Patient reports his last visit with cardiology was prior to him moving to this area as it was before 2020.  Patient reports he does not feel palpitations or chest pain.   Patient does report that at the beginning of last week he thought he was getting better, and felt better for 2 weeks.  Then this past week he started getting worse again.  He was going to try to hold out to see his specialist, but he has not seen pulmonology until 17 April and he does not see GI until 29th.  You decided he could not wait that long and came into the ED today.   Patient does not smoke, does not drink, does not use illicit drugs.  He is vaccinated for COVID and  flu.  Patient started.   Assessment and Plan: * Atrial fibrillation with RVR (HCC) - With associated dyspnea on exertion - Initially started on diltiazem drip and now transitioned to oral diltiazem and oral metoprolol increased to 50 mg BID  - Holding off on anticoagulation at this time as oncology is recommending IR guided liver biopsy - CHA2DS2-VASc 1 - Echo completed see below - Monitor and correct electrolytes as indicated - Troponin 7, 7 - No signs of infection - Check TSH---> 2.127 - Continue to monitor -- continue diltiazem 60 mg Q6h and metoprolol 50 mg BID   Leukocytosis - Significant leukocytosis at 21.5 without an obvious sign of infection, WBC trending down slowly - COVID and  flu negative - UA is not indicative of UTI - CT chest abdomen pelvis shows a pancreatic mass but no signs of infection - Lactic acid normal x 2 - Procalcitonin 0.28  - Continue to monitor -- does have sinusitis symptoms, treating with oral augmentin   Asthma, chronic - Continue Singulair - Albuterol as needed - Continue to monitor  GERD (gastroesophageal reflux disease) - Continue PPI  Hyperlipidemia - Continue statin  Essential hypertension - now on diltiazem 240 mg and oral metoprolol 50 mg BID     Pancreatic mass - New finding on CT scan - Dr. Raliegh Ip was consulted from the ED recommended a CA 19-9, and a IR ultrasound-guided biopsy of the liver - Heart healthy diet now, n.p.o. after midnight - s/p IR biopsy of liver 09/18/22 -- pt to transport to [facility] and then return to [facility] after procedure  -- results from pathology to be available by next week per radiology -- urgent referral requested to Dr. Raliegh Ip to review and discuss pathology results   High risk sexual behavior -- he is on PREP therapy now per his PCP    DVT prophylaxis: SCDs Code Status: Full  Family Communication:  Disposition: Status is: Inpatient Remains inpatient appropriate because: uncontrolled Afib RVR    Consultants:  IR radiology Oncology Delton Coombes)  Procedures:   Antimicrobials:    Subjective: Pt having sinus drainage and chest congestion, some chills.      Objective: Vitals:   09/17/22 2222 09/18/22 0453 09/18/22 0500 09/18/22 1548  BP: (!) 126/110 (!) 136/93  (!) 188/89  Pulse: 100 100  95  Resp: 18 18  20   Temp: 98.3 F (36.8 C)   97.6 F (36.4 C)  TempSrc: Oral Oral  Oral  SpO2: 96% 96%  97%  Weight:   (!) 154.8 kg   Height:       No intake or output data in the 24 hours ending 09/18/22 1706  Filed Weights   09/16/22 0515 09/17/22 0558 09/18/22 0500  Weight: (!) 151 kg (!) 153.5 kg (!) 154.8 kg   Examination:  General exam: Appears calm and comfortable  Respiratory system: Clear  to auscultation. Respiratory effort normal. Cardiovascular system: tachycardic irregularly irregular rate, normal S1 & S2 heard. No JVD, murmurs, rubs, gallops or clicks. trace pedal edema. Gastrointestinal system: Abdomen is nondistended, soft and nontender. No organomegaly or masses felt. Normal bowel sounds heard. Central nervous system: Alert and oriented. No focal neurological deficits. Extremities: Symmetric 5 x 5 power. Skin: No rashes, lesions or ulcers. Psychiatry: Judgement and insight appear normal. Mood & affect appropriate.   Data Reviewed: I have personally reviewed following labs and imaging studies  CBC: Recent Labs  Lab 09/15/22 0902 09/16/22 0431 09/17/22 0430  WBC 21.5* 19.7* 23.3*  NEUTROABS 16.7*  15.0* 17.9*  HGB 14.0 12.2* 11.4*  HCT 44.3 39.3 36.1*  MCV 85.9 87.5 87.2  PLT 179 174 [id number]    Basic Metabolic Panel: Recent Labs  Lab 09/15/22 0902 09/16/22 0431 09/17/22 0430  NA 133* 137 136  K 3.9 3.8 3.6  CL 103 106 105  CO2 22 24 24   GLUCOSE 119* 105* 125*  BUN 15 12 12   CREATININE 0.58* 0.67 0.70  CALCIUM 9.1 9.2 8.9  MG 1.9 2.0 1.7    CBG: Recent Labs  Lab 09/18/22 0806 09/18/22 1622  GLUCAP 121* 114*    Recent Results (from the past 240 hour(s))  Resp panel by RT-PCR (RSV, Flu A&B, Covid) Anterior Nasal Swab     Status: None   Collection Time: 09/15/22  9:29 AM   Specimen: Anterior Nasal Swab  Result Value Ref Range Status   SARS Coronavirus 2 by RT PCR NEGATIVE NEGATIVE Final    Comment: (NOTE) SARS-CoV-2 target nucleic acids are NOT DETECTED.  The SARS-CoV-2 RNA is generally detectable in upper respiratory specimens during the acute phase of infection. The lowest concentration of SARS-CoV-2 viral copies this assay can detect is 138 copies/mL. A negative result does not preclude SARS-Cov-2 infection and should not be used as the sole basis for treatment or other patient management decisions. A negative result may occur with   improper specimen collection/handling, submission of specimen other than nasopharyngeal swab, presence of viral mutation(s) within the areas targeted by this assay, and inadequate number of viral copies(<138 copies/mL). A negative result must be combined with clinical observations, patient history, and epidemiological information. The expected result is Negative.  Fact Sheet for Patients:  EntrepreneurPulse.com.au  Fact Sheet for Healthcare Providers:  IncredibleEmployment.be  This test is no t yet approved or cleared by the [place] FDA and  has been authorized for detection and/or diagnosis of SARS-CoV-2 by FDA under an Emergency Use Authorization (EUA). This EUA will remain  in effect (meaning this test can be used) for the duration of the COVID-19 declaration under Section 564(b)(1) of the Act, 21 U.S.C.section 360bbb-3(b)(1), unless the authorization is terminated  or revoked sooner.       Influenza A by PCR NEGATIVE NEGATIVE Final   Influenza B by PCR NEGATIVE NEGATIVE Final    Comment: (NOTE) The Xpert Xpress SARS-CoV-2/FLU/RSV plus assay is intended as an aid in the diagnosis of influenza from Nasopharyngeal swab specimens and should not be used as a sole basis for treatment. Nasal washings and aspirates are unacceptable for Xpert Xpress SARS-CoV-2/FLU/RSV testing.  Fact Sheet for Patients: EntrepreneurPulse.com.au  Fact Sheet for Healthcare Providers: IncredibleEmployment.be  This test is not yet approved or cleared by the [place] FDA and has been authorized for detection and/or diagnosis of SARS-CoV-2 by FDA under an Emergency Use Authorization (EUA). This EUA will remain in effect (meaning this test can be used) for the duration of the COVID-19 declaration under Section 564(b)(1) of the Act, 21 U.S.C. section 360bbb-3(b)(1), unless the authorization is terminated or revoked.      Resp Syncytial Virus by PCR NEGATIVE NEGATIVE Final    Comment: (NOTE) Fact Sheet for Patients: EntrepreneurPulse.com.au  Fact Sheet for Healthcare Providers: IncredibleEmployment.be  This test is not yet approved or cleared by the [place] FDA and has been authorized for detection and/or diagnosis of SARS-CoV-2 by FDA under an Emergency Use Authorization (EUA). This EUA will remain in effect (meaning this test can be used) for the duration of the COVID-19 declaration under Section  564(b)(1) of the Act, 21 U.S.C. section 360bbb-3(b)(1), unless the authorization is terminated or revoked.  Performed at [facility], [address] [place], [place] [address]   Blood Culture (routine x 2)     Status: None (Preliminary result)   Collection Time: 09/15/22  1:59 PM   Specimen: BLOOD LEFT HAND  Result Value Ref Range Status   Specimen Description BLOOD LEFT HAND  Final   Special Requests   Final    BOTTLES DRAWN AEROBIC AND ANAEROBIC Blood Culture adequate volume   Culture   Final    NO GROWTH 3 DAYS Performed at [facility], [address], [place], [place] [address]    Report Status PENDING  Incomplete  Blood Culture (routine x 2)     Status: None (Preliminary result)   Collection Time: 09/15/22  2:07 PM   Specimen: BLOOD LEFT ARM  Result Value Ref Range Status   Specimen Description BLOOD LEFT ARM  Final   Special Requests   Final    BOTTLES DRAWN AEROBIC AND ANAEROBIC Blood Culture adequate volume   Culture   Final    NO GROWTH 3 DAYS Performed at [facility], [address], [place], [place] [address]    Report Status PENDING  Incomplete     Radiology Studies: US BIOPSY (LIVER)  Result Date: 09/18/2022 CLINICAL DATA:  Abdominal pain, CT demonstrates pancreatic mass with multiple liver lesions, biopsy requested EXAM: ULTRASOUND-GUIDED CORE LIVER BIOPSY TECHNIQUE: An ultrasound guided liver biopsy was thoroughly discussed with the  patient and questions were answered. The benefits, risks, alternatives, and complications were also discussed. The patient understands and wishes to proceed with the procedure. A verbal as well as written consent was obtained. Survey ultrasound of the liver was performed, representative right lobe lesion localized, and an appropriate skin entry site was determined. Skin site was marked, prepped with chlorhexidine, and draped in usual sterile fashion, and infiltrated locally with 1% lidocaine. Intravenous Fentanyl 62mcg and Versed 1mg  were administered as conscious sedation during continuous monitoring of the patient's level of consciousness and physiological / cardiorespiratory status by the radiology RN, with a total moderate sedation time of 13 minutes. A 17 gauge trocar needle was advanced under ultrasound guidance into the liver to the margin of the lesion. 3 solid-appearing 2 cm coaxial 18gauge core samples were then obtained through the guide needle. The guide needle was removed. Post procedure scans demonstrate no apparent complication. COMPLICATIONS: COMPLICATIONS None immediate FINDINGS: Multiple liver lesions localized. Representative core biopsy of a right lesion were obtained as above. IMPRESSION: Technically successful ultrasound guided core biopsy of right hepatic lesion. Electronically Signed   By: Lucrezia Europe M.D.   On: 09/18/2022 11:40    Scheduled Meds:  (feeding supplement) PROSource Plus  30 mL Oral TID BM   amoxicillin-clavulanate  1 tablet Oral Q12H   atorvastatin  20 mg Oral Daily   Chlorhexidine Gluconate Cloth  6 each Topical Daily   diltiazem  240 mg Oral Daily   emtricitabine-tenofovir AF  1 tablet Oral Daily   fluticasone  2 spray Each Nare Daily   metoprolol tartrate  50 mg Oral BID   montelukast  10 mg Oral QHS   pantoprazole  40 mg Oral Daily   pneumococcal 20-valent conjugate vaccine  0.5 mL Intramuscular Tomorrow-1000   tamsulosin  0.4 mg Oral QHS   Continuous  Infusions:  sodium chloride Stopped (09/18/22 1000)    LOS: 3 days   Time spent: 35 mins  Craig Byard,  MD How to contact the [facility] Attending or Consulting provider [place] or covering provider during after hours [place], for this patient?  Check the care team in [facility] and look for a) attending/consulting [facility] provider listed and b) the [facility] team listed Log into [email or web address] and use [facility] universal password to access. If you do not have the password, please contact the hospital operator. Locate the [facility] provider you are looking for under [facility] and page to a number that you can be directly reached. If you still have difficulty reaching the provider, please page the [facility] (Director on Call) for the Hospitalists listed on amion for assistance.  09/18/2022, 5:06 PM

## 2022-09-19 DIAGNOSIS — I4891 Unspecified atrial fibrillation: Secondary | ICD-10-CM | POA: Diagnosis not present

## 2022-09-19 DIAGNOSIS — Z7251 High risk heterosexual behavior: Secondary | ICD-10-CM

## 2022-09-19 DIAGNOSIS — I10 Essential (primary) hypertension: Secondary | ICD-10-CM | POA: Diagnosis not present

## 2022-09-19 DIAGNOSIS — D72829 Elevated white blood cell count, unspecified: Secondary | ICD-10-CM | POA: Diagnosis not present

## 2022-09-19 DIAGNOSIS — K219 Gastro-esophageal reflux disease without esophagitis: Secondary | ICD-10-CM | POA: Diagnosis not present

## 2022-09-19 LAB — GLUCOSE, CAPILLARY
Glucose-Capillary: 113 mg/dL — ABNORMAL HIGH (ref 70–99)
Glucose-Capillary: 104 mg/dL — ABNORMAL HIGH (ref 70–99)

## 2022-09-19 MED ORDER — METOPROLOL TARTRATE 50 MG PO TABS
75.0000 mg | ORAL_TABLET | Freq: Two times a day (BID) | ORAL | Status: DC
  Administered 2022-09-19: 75 mg via ORAL
  Filled 2022-09-19: qty 1

## 2022-09-19 MED ORDER — SACCHAROMYCES BOULARDII 250 MG PO CAPS: 250.0000 mg | ORAL_CAPSULE | Freq: Two times a day (BID) | ORAL | 0 refills | Status: DC

## 2022-09-19 MED ORDER — ATORVASTATIN CALCIUM 20 MG PO TABS: 20.0000 mg | ORAL_TABLET | Freq: Every day | ORAL | 2 refills | Status: DC

## 2022-09-19 MED ORDER — METOPROLOL TARTRATE 75 MG PO TABS: 75.0000 mg | ORAL_TABLET | Freq: Two times a day (BID) | ORAL | 2 refills | Status: DC

## 2022-09-19 MED ORDER — FLUTICASONE PROPIONATE 50 MCG/ACT NA SUSP: 2.0000 | Freq: Every day | NASAL | 1 refills | Status: AC

## 2022-09-19 MED ORDER — DILTIAZEM HCL ER COATED BEADS 180 MG PO CP24
300.0000 mg | ORAL_CAPSULE | Freq: Every day | ORAL | Status: DC
  Administered 2022-09-19: 300 mg via ORAL
  Filled 2022-09-19: qty 1

## 2022-09-19 MED ORDER — AMOXICILLIN-POT CLAVULANATE 875-125 MG PO TABS: 1.0000 | ORAL_TABLET | Freq: Two times a day (BID) | ORAL | 0 refills | Status: AC

## 2022-09-19 MED ORDER — DILTIAZEM HCL ER COATED BEADS 300 MG PO CP24: 300.0000 mg | ORAL_CAPSULE | Freq: Every day | ORAL | 2 refills | Status: DC

## 2022-09-19 NOTE — Progress Notes (Signed)
Pt slept through the night. No c/o pain noted. Pt hypertensive and tachycardic. HR has continued to be irregular and went into 140s with exertion.

## 2022-09-19 NOTE — Discharge Summary (Addendum)
Physician Discharge Summary  Kimari Vong [id number] DOB: [birth year] DOA: 09/15/2022  PCP: Carrolyn Meiers, MD  Admit date: 09/15/2022 Discharge date: 09/19/2022  Admitted From: Home  Disposition: Home   Recommendations for Outpatient Follow-up:  Follow up with Dr. Delton Coombes in 1 weeks Please follow up on the following pending results: Liver Biopsy   Discharge Condition: STABLE   CODE STATUS: FULL DIET: regular    Brief Hospitalization Summary: Please see all hospital notes, images, labs for full details of the hospitalization. ADMISSION HPI:   62 y.o. male with medical history significant of Skin cancer, GERD, hyperlipidemia, hypertension, presents the ED with a chief complaint of not feeling well. Patient reports this started 2 to 3 months ago shortness of breath.  Patient reports that he first noticed it while singing, as he is a Scientist, clinical (histocompatibility and immunogenetics).  He reports that he noticed he could not finish as many songs and he was feeling completely exhausted while singing.  He then noticed some rib pain with breathing.  He had a cough that was productive of clear sputum but has since gotten better.  Patient denies any chest pain, palpitations, audible wheezing.  He does have a history of asthma.  He used to have a rescue inhaler but after being started on Singulair he did not need the rescue inhaler, and has not been on it since.  Ports that his symptoms then became more related to gastrointestinal tract.  He saw his GI doctor was recommended to take a daily laxative.  He took his daily laxative he reports his abdominal pain went away.  Prior to the daily laxative his pain was described as sharp in the right upper quadrant and left upper quadrant.  It was intermittent.  Eating had no effect on it.  Patient has not had an appetite though and has lost some weight.  Patient reports that he noticed that some foods, i.e. hamburger, did provoke the pain, but eating in general did not make it worse.  Patient  reports she has had hot flashes during the day due to cold sweats at night.  He reports he takes ibuprofen before bed and attributes waking up in a cold sweat to his fever breaking due to the ibuprofen.  The highest measured temp he had at home is 100.0.  Patient reports he is also had some nausea but no vomiting.  He has not had any hematochezia or melena.  His last normal meal was so long ago that he cannot remember.  This morning for breakfast he had 2 eggs and then he had for lunch some crackers in the ED.  His last normal bowel movement was day before yesterday.  He reports he has not been taking laxative the past couple days because he has not felt good enough.   Patient reports that in 1999 there was a mention of an irregular heartbeat during her hospitalization.  Since then he was following with cardiologist and was not told that he had an irregular heartbeat or atrial fibrillation again.  Patient reports his last visit with cardiology was prior to him moving to this area as it was before 2020.  Patient reports he does not feel palpitations or chest pain.   Patient does report that at the beginning of last week he thought he was getting better, and felt better for 2 weeks.  Then this past week he started getting worse again.  He was going to try to hold out to see his specialist, but he has not  seen pulmonology until 17 April and he does not see GI until 29th.  You decided he could not wait that long and came into the ED today.   Patient does not smoke, does not drink, does not use illicit drugs.  He is vaccinated for COVID and flu.  Patient started.  HOSPITAL COURSE BY PROBLEM   Afib with RVR - likely exacerbated by underlying malignancy - increased diltiazem CD to 300 mg daily - added metoprolol 75 mg BID  - HR improved with medication increases - CHA2DS2-VASc-1 resume home aspirin daily - TSH 2.127 - Echo noted below  Acute Sinusitis and Allergic Rhinitis - Augmentin 875 mg BID x 5  days - fluticasone nasal spray daily   Pancreatic mass with liver metastasis - New finding on CT scan - Dr. Raliegh Ip was consulted from the ED recommended a CA 19-9, and a IR ultrasound-guided biopsy of the liver - s/p IR biopsy of liver 09/18/22 -- pt to transport to [facility] and then return to [facility] after procedure  -- results from pathology to be available by next week per radiology -- urgent referral requested to Dr. Raliegh Ip to review and discuss pathology results  -- CA 19-9 was elevated at >4000  Leukocytosis  - possibly from sinus infection  - recheck CBC after completing therapy  - Covid and flu negative  Asthma  - resume home singulair - albuterol as needed  GERD - continue home PPI  Hyperlipidemia - substitute atorvastatin for simvastatin due to drug interaction   Essential hypertension  - increased dose of diltiazem CD to 300 mg  - increased dose of metoprolol to 75 mg BID  High risk sexual behavior -- he is on PREP therapy now per his PCP   OSA - resume nightly CPAP therapy    Discharge Diagnoses:  Principal Problem:   Atrial fibrillation with RVR ([place]) Active Problems:   On pre-exposure prophylaxis for HIV   High risk sexual behavior   OSA on CPAP   Pancreatic mass   Essential hypertension   Hyperlipidemia   GERD (gastroesophageal reflux disease)   Asthma, chronic   Leukocytosis   Discharge Instructions: Discharge Instructions     Ambulatory referral to Hematology / Oncology   Complete by: As directed       Allergies as of 09/19/2022   No Known Allergies      Medication List     STOP taking these medications    benzonatate 100 MG capsule Commonly known as: TESSALON   Dilt-XR 240 MG 24 hr capsule Generic drug: diltiazem   hydrALAZINE 25 MG tablet Commonly known as: APRESOLINE   lisinopril 40 MG tablet Commonly known as: ZESTRIL   simvastatin 40 MG tablet Commonly known as: ZOCOR       TAKE these medications    amoxicillin-clavulanate  875-125 MG tablet Commonly known as: AUGMENTIN Take 1 tablet by mouth every 12 (twelve) hours for 5 days.   aspirin EC 81 MG tablet Take 81 mg by mouth every evening.   atorvastatin 20 MG tablet Commonly known as: LIPITOR Take 1 tablet (20 mg total) by mouth daily. Start taking on: September 20, 2022   Descovy 200-25 MG tablet Generic drug: emtricitabine-tenofovir AF Take 1 tablet by mouth daily.   diltiazem 300 MG 24 hr capsule Commonly known as: CARDIZEM CD Take 1 capsule (300 mg total) by mouth daily. Start taking on: September 20, 2022   esomeprazole 40 MG capsule Commonly known as: NEXIUM Take 40 mg by mouth  every morning.   fluticasone 50 MCG/ACT nasal spray Commonly known as: FLONASE Place 2 sprays into both nostrils daily for 14 days. Start taking on: September 20, 2022   furosemide 20 MG tablet Commonly known as: LASIX TAKE 1 TABLET BY MOUTH DAILY AS NEEDED FOR LOWER LEG SWELLING   Metoprolol Tartrate 75 MG Tabs Take 1 tablet (75 mg total) by mouth 2 (two) times daily.   montelukast 10 MG tablet Commonly known as: SINGULAIR Take 10 mg by mouth at bedtime.   ondansetron 4 MG disintegrating tablet Commonly known as: ZOFRAN-ODT Take 1 tablet (4 mg total) by mouth every 8 (eight) hours as needed for nausea or vomiting.   promethazine-dextromethorphan 6.25-15 MG/5ML syrup Commonly known as: PROMETHAZINE-DM Take 5 mLs by mouth at bedtime as needed for cough. Do not take with alcohol or while driving or operating heavy machinery.  May cause drowsiness.   saccharomyces boulardii 250 MG capsule Commonly known as: Florastor Take 1 capsule (250 mg total) by mouth 2 (two) times daily.   tadalafil 5 MG tablet Commonly known as: CIALIS Take 5 mg by mouth in the morning.   Tafluprost (PF) 0.0015 % Soln Place 1 drop into both eyes at bedtime.   tamsulosin 0.4 MG Caps capsule Commonly known as: FLOMAX Take 0.4 mg by mouth at bedtime.   ZINC PO Take 1 tablet by mouth  daily.        Follow-up Information     Derek Jack, MD Follow up in 1 week(s).   Specialty: Hematology Why: Hospital Follow Up Contact information: [address]  [place] [address] [phone]         Carrolyn Meiers, MD. Schedule an appointment as soon as possible for a visit in 1 week(s).   Specialty: Internal Medicine Why: Hospital Follow Up Contact information: [place] [address] [phone]                No Known Allergies Allergies as of 09/19/2022   No Known Allergies      Medication List     STOP taking these medications    benzonatate 100 MG capsule Commonly known as: TESSALON   Dilt-XR 240 MG 24 hr capsule Generic drug: diltiazem   hydrALAZINE 25 MG tablet Commonly known as: APRESOLINE   lisinopril 40 MG tablet Commonly known as: ZESTRIL   simvastatin 40 MG tablet Commonly known as: ZOCOR       TAKE these medications    amoxicillin-clavulanate 875-125 MG tablet Commonly known as: AUGMENTIN Take 1 tablet by mouth every 12 (twelve) hours for 5 days.   aspirin EC 81 MG tablet Take 81 mg by mouth every evening.   atorvastatin 20 MG tablet Commonly known as: LIPITOR Take 1 tablet (20 mg total) by mouth daily. Start taking on: September 20, 2022   Descovy 200-25 MG tablet Generic drug: emtricitabine-tenofovir AF Take 1 tablet by mouth daily.   diltiazem 300 MG 24 hr capsule Commonly known as: CARDIZEM CD Take 1 capsule (300 mg total) by mouth daily. Start taking on: September 20, 2022   esomeprazole 40 MG capsule Commonly known as: NEXIUM Take 40 mg by mouth every morning.   fluticasone 50 MCG/ACT nasal spray Commonly known as: FLONASE Place 2 sprays into both nostrils daily for 14 days. Start taking on: September 20, 2022   furosemide 20 MG tablet Commonly known as: LASIX TAKE 1 TABLET BY MOUTH DAILY AS NEEDED FOR LOWER LEG SWELLING   Metoprolol Tartrate  75 MG Tabs Take 1  tablet (75 mg total) by mouth 2 (two) times daily.   montelukast 10 MG tablet Commonly known as: SINGULAIR Take 10 mg by mouth at bedtime.   ondansetron 4 MG disintegrating tablet Commonly known as: ZOFRAN-ODT Take 1 tablet (4 mg total) by mouth every 8 (eight) hours as needed for nausea or vomiting.   promethazine-dextromethorphan 6.25-15 MG/5ML syrup Commonly known as: PROMETHAZINE-DM Take 5 mLs by mouth at bedtime as needed for cough. Do not take with alcohol or while driving or operating heavy machinery.  May cause drowsiness.   saccharomyces boulardii 250 MG capsule Commonly known as: Florastor Take 1 capsule (250 mg total) by mouth 2 (two) times daily.   tadalafil 5 MG tablet Commonly known as: CIALIS Take 5 mg by mouth in the morning.   Tafluprost (PF) 0.0015 % Soln Place 1 drop into both eyes at bedtime.   tamsulosin 0.4 MG Caps capsule Commonly known as: FLOMAX Take 0.4 mg by mouth at bedtime.   ZINC PO Take 1 tablet by mouth daily.        Procedures/Studies: US BIOPSY (LIVER)  Result Date: 09/18/2022 CLINICAL DATA:  Abdominal pain, CT demonstrates pancreatic mass with multiple liver lesions, biopsy requested EXAM: ULTRASOUND-GUIDED CORE LIVER BIOPSY TECHNIQUE: An ultrasound guided liver biopsy was thoroughly discussed with the patient and questions were answered. The benefits, risks, alternatives, and complications were also discussed. The patient understands and wishes to proceed with the procedure. A verbal as well as written consent was obtained. Survey ultrasound of the liver was performed, representative right lobe lesion localized, and an appropriate skin entry site was determined. Skin site was marked, prepped with chlorhexidine, and draped in usual sterile fashion, and infiltrated locally with 1% lidocaine. Intravenous Fentanyl 8mcg and Versed 1mg  were administered as conscious sedation during continuous monitoring of the patient's level of consciousness and  physiological / cardiorespiratory status by the radiology RN, with a total moderate sedation time of 13 minutes. A 17 gauge trocar needle was advanced under ultrasound guidance into the liver to the margin of the lesion. 3 solid-appearing 2 cm coaxial 18gauge core samples were then obtained through the guide needle. The guide needle was removed. Post procedure scans demonstrate no apparent complication. COMPLICATIONS: COMPLICATIONS None immediate FINDINGS: Multiple liver lesions localized. Representative core biopsy of a right lesion were obtained as above. IMPRESSION: Technically successful ultrasound guided core biopsy of right hepatic lesion. Electronically Signed   By: Lucrezia Europe M.D.   On: 09/18/2022 11:40   ECHOCARDIOGRAM COMPLETE  Result Date: 09/16/2022    ECHOCARDIOGRAM REPORT   Patient Name:   JERRIEL MURAT Date of Exam: 09/16/2022 Medical Rec #:  [id number]     Height:       77.0 in Accession #:    [id number]    Weight:       332.9 lb Date of Birth:  05/25/1961     BSA:          2.778 m Patient Age:    60 years      BP:           112/67 mmHg Patient Gender: M             HR:           92 bpm. Exam Location:  [facility] Procedure: 2D Echo, Color Doppler, Cardiac Doppler and Intracardiac            Opacification Agent Indications:    Atrial Fibrillation I48.91  History:        Patient has no prior history of Echocardiogram examinations.                 Arrythmias:Atrial Fibrillation; Risk Factors:Hypertension,                 Dyslipidemia, Former Smoker and Sleep Apnea.  Sonographer:    Greer Pickerel Referring Phys: [id number] ASIA B [place]  Sonographer Comments: No subcostal window. Image acquisition challenging due to patient body habitus and Image acquisition challenging due to respiratory motion. IMPRESSIONS  1. Left ventricular ejection fraction, by estimation, is 55 to 60%. The left ventricle has normal function. The left ventricle has no regional wall motion abnormalities. There is mild left  ventricular hypertrophy. Left ventricular diastolic parameters are indeterminate.  2. RV-RA gradient 25 mmHg suggesting normal estimated RVSP presuming normal CVP. Right ventricular systolic function is normal. The right ventricular size is normal.  3. The mitral valve is grossly normal. Trivial mitral valve regurgitation.  4. The aortic valve is tricuspid. There is mild calcification of the aortic valve. Aortic valve regurgitation is not visualized. Aortic valve sclerosis is present, with no evidence of aortic valve stenosis.  5. Unable to estimate CVP. Comparison(s): No prior Echocardiogram. FINDINGS  Left Ventricle: Left ventricular ejection fraction, by estimation, is 55 to 60%. The left ventricle has normal function. The left ventricle has no regional wall motion abnormalities. Definity contrast agent was given IV to delineate the left ventricular  endocardial borders. The left ventricular internal cavity size was normal in size. There is mild left ventricular hypertrophy. Abnormal (paradoxical) septal motion, consistent with left bundle branch block. Left ventricular diastolic function could not be evaluated due to atrial fibrillation. Left ventricular diastolic parameters are indeterminate. Right Ventricle: RV-RA gradient 25 mmHg suggesting normal estimated RVSP presuming normal CVP. The right ventricular size is normal. No increase in right ventricular wall thickness. Right ventricular systolic function is normal. Left Atrium: Left atrial size was normal in size. Right Atrium: Right atrial size was normal in size. Pericardium: There is no evidence of pericardial effusion. Mitral Valve: The mitral valve is grossly normal. Trivial mitral valve regurgitation. Tricuspid Valve: The tricuspid valve is grossly normal. Tricuspid valve regurgitation is mild. Aortic Valve: The aortic valve is tricuspid. There is mild calcification of the aortic valve. Aortic valve regurgitation is not visualized. Aortic valve  sclerosis is present, with no evidence of aortic valve stenosis. Pulmonic Valve: The pulmonic valve was grossly normal. Pulmonic valve regurgitation is trivial. Aorta: The aortic root is normal in size and structure. Venous: Unable to estimate CVP. The inferior vena cava was not well visualized. IAS/Shunts: No atrial level shunt detected by color flow Doppler.  LEFT VENTRICLE PLAX 2D LVIDd:         4.80 cm   Diastology LVIDs:         3.20 cm   LV e' medial:    11.20 cm/s LV PW:         1.20 cm   LV E/e' medial:  9.9 LV IVS:        0.90 cm   LV e' lateral:   12.10 cm/s LVOT diam:     2.40 cm   LV E/e' lateral: 9.2 LV SV:         68 LV SV Index:   25 LVOT Area:     4.52 cm  RIGHT VENTRICLE RV S prime:     15.20 cm/s TAPSE (M-mode): 1.9 cm LEFT ATRIUM  Index        RIGHT ATRIUM           Index LA diam:        5.20 cm 1.87 cm/m   RA Area:     18.00 cm LA Vol (A2C):   52.5 ml 18.90 ml/m  RA Volume:   41.60 ml  14.98 ml/m LA Vol (A4C):   75.8 ml 27.29 ml/m LA Biplane Vol: 69.6 ml 25.06 ml/m  AORTIC VALVE             PULMONIC VALVE LVOT Vmax:   99.00 cm/s  PR End Diast Vel: 1.52 msec LVOT Vmean:  66.800 cm/s LVOT VTI:    0.151 m  AORTA Ao Root diam: 3.80 cm Ao Asc diam:  3.90 cm MITRAL VALVE                TRICUSPID VALVE MV Area (PHT): 4.49 cm     TR Peak grad:   25.0 mmHg MV Decel Time: 169 msec     TR Vmax:        250.00 cm/s MR Peak grad: 20.5 mmHg MR Vmax:      226.45 cm/s   SHUNTS MV E velocity: 111.00 cm/s  Systemic VTI:  0.15 m MV A velocity: 42.20 cm/s   Systemic Diam: 2.40 cm MV E/A ratio:  2.63 Rozann Lesches MD Electronically signed by Rozann Lesches MD Signature Date/Time: 09/16/2022/11:13:36 AM    Final    CT ABDOMEN PELVIS W CONTRAST  Result Date: 09/15/2022 CLINICAL DATA:  Abdominal pain, shortness of breath, lethargy and loss of appetite for 3 months * Tracking Code: BO * EXAM: CT ABDOMEN AND PELVIS WITH CONTRAST TECHNIQUE: Multidetector CT imaging of the abdomen and pelvis was  performed using the standard protocol following bolus administration of intravenous contrast. RADIATION DOSE REDUCTION: This exam was performed according to the departmental dose-optimization program which includes automated exposure control, adjustment of the mA and/or kV according to patient size and/or use of iterative reconstruction technique. CONTRAST:  130mL OMNIPAQUE IOHEXOL 350 MG/ML SOLN COMPARISON:  None Available. FINDINGS: Lower chest: Please see separately reported examination of the chest. Hepatobiliary: Numerous hypodense lesions throughout the liver, largest lesion in the anterior liver dome, hepatic segment VIII measuring 4.8 x 4.2 cm (series 4, image 43). Severe hepatomegaly, maximum coronal span 26.5 cm (series 8, image 64). No gallstones, gallbladder wall thickening, or biliary dilatation. Pancreas: Hypodense mass arising from the tip of the pancreatic tail and involving the closely adjacent splenic hilum measuring 6.0 x 5.0 cm (series 4, image 40). No pancreatic ductal dilatation or surrounding inflammatory changes. Spleen: Splenomegaly, maximum coronal span 16.0 cm. Adrenals/Urinary Tract: Adrenal glands are unremarkable. Simple, benign bilateral renal cortical cysts, for which no further follow-up or characterization is required. Kidneys are otherwise normal, without renal calculi, solid lesion, or hydronephrosis. Bladder is unremarkable. Stomach/Bowel: Stomach is within normal limits. Appendix appears normal. No evidence of bowel wall thickening, distention, or inflammatory changes. Vascular/Lymphatic: Aortic atherosclerosis. 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 No enlarged abdominal or pelvic lymph nodes. Reproductive: No mass or other significant abnormality. Other: No abdominal wall hernia or abnormality. No ascites. Musculoskeletal: No acute or significant osseous findings. IMPRESSION: 1. Hypodense mass arising from the tip of the  pancreatic tail and involving the closely adjacent splenic hilum measuring 6.0 x 5.0 cm consistent with primary pancreatic adenocarcinoma. 2. Numerous hypodense metastatic lesions throughout the liver. 3. Severe hepatomegaly and  splenomegaly. 4. 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 Aortic Atherosclerosis (ICD10-I70.0). Electronically Signed   By: Delanna Ahmadi M.D.   On: 09/15/2022 12:50   CT Angio Chest PE W and/or Wo Contrast  Result Date: 09/15/2022 CLINICAL DATA:  PE suspected, shortness of breath for 3 months * Tracking Code: BO * EXAM: CT ANGIOGRAPHY CHEST WITH CONTRAST TECHNIQUE: Multidetector CT imaging of the chest was performed using the standard protocol during bolus administration of intravenous contrast. Multiplanar CT image reconstructions and MIPs were obtained to evaluate the vascular anatomy. RADIATION DOSE REDUCTION: This exam was performed according to the departmental dose-optimization program which includes automated exposure control, adjustment of the mA and/or kV according to patient size and/or use of iterative reconstruction technique. CONTRAST:  163mL OMNIPAQUE IOHEXOL 350 MG/ML SOLN COMPARISON:  None Available. FINDINGS: Cardiovascular: Satisfactory opacification of the pulmonary arteries to the segmental level. No evidence of pulmonary embolism. Normal heart size. Three-vessel coronary artery calcifications. No pericardial effusion. Aortic atherosclerosis. Mediastinum/Nodes: No enlarged mediastinal, hilar, or axillary lymph nodes. Thyroid gland, trachea, and esophagus demonstrate no significant findings. Lungs/Pleura: Trace right pleural effusion with associated scarring and or atelectasis of the right lung base. Upper Abdomen: Numerous hypodense lesions throughout the liver as well as a mass of the pancreatic tail (series 07/24/2003). Musculoskeletal: No chest wall abnormality. No acute osseous findings. Review of the MIP  images confirms the above findings. IMPRESSION: 1. Negative examination for pulmonary embolism. 2. Trace right pleural effusion with associated scarring and or atelectasis of the right lung base. 3. Numerous hypodense lesions throughout the liver as well as a mass of the pancreatic tail. Findings are highly concerning for primary pancreatic malignancy and hepatic metastatic disease; abdominal findings further discussed on forthcoming dedicated, separately reported examination of the abdomen and pelvis. 4. Coronary artery disease. Aortic Atherosclerosis (ICD10-I70.0). Electronically Signed   By: Delanna Ahmadi M.D.   On: 09/15/2022 12:44   DG Chest 2 View  Result Date: 08/25/2022 CLINICAL DATA:  Cough, RIGHT lower chest pain, shortness of breath EXAM: CHEST - 2 VIEW COMPARISON:  Chest x-ray dated 07/05/2020 FINDINGS: Heart size and mediastinal contours are stable. Lungs are clear. No pleural effusion or pneumothorax is seen. Osseous structures about the chest are unremarkable. Stable elevation of the RIGHT hemidiaphragm. IMPRESSION: No active cardiopulmonary disease. No evidence of pneumonia or pulmonary edema. Electronically Signed   By: Franki Cabot M.D.   On: 08/25/2022 16:00     Subjective: Pt rested well no specific complaints, still having sinus drainage postnasal drainage and occasional cough, he denies being in pain; he is willing to follow up with oncology next week for biopsy results and to discuss treatment plan   Discharge Exam: Vitals:   09/19/22 0332 09/19/22 1022  BP: (!) 152/97 119/78  Pulse: (!) 108 (!) 103  Resp: 20   Temp: 98.3 F (36.8 C)   SpO2: 97%    Vitals:   09/18/22 2114 09/19/22 0332 09/19/22 0335 09/19/22 1022  BP: (!) 139/90 (!) 152/97  119/78  Pulse: (!) 105 (!) 108  (!) 103  Resp: 18 20    Temp: 98.9 F (37.2 C) 98.3 F (36.8 C)    TempSrc:  Oral    SpO2: 96% 97%    Weight:   (!) 152.6 kg   Height:       General: Pt is alert, awake, not in acute  distress Cardiovascular: normal S1/S2 +, no rubs, no gallops Respiratory:  CTA bilaterally, no wheezing, no rhonchi Abdominal: Soft, NT, ND, bowel sounds + Extremities: no edema, no cyanosis   The results of significant diagnostics from this hospitalization (including imaging, microbiology, ancillary and laboratory) are listed below for reference.     Microbiology: Recent Results (from the past 240 hour(s))  Resp panel by RT-PCR (RSV, Flu A&B, Covid) Anterior Nasal Swab     Status: None   Collection Time: 09/15/22  9:29 AM   Specimen: Anterior Nasal Swab  Result Value Ref Range Status   SARS Coronavirus 2 by RT PCR NEGATIVE NEGATIVE Final    Comment: (NOTE) SARS-CoV-2 target nucleic acids are NOT DETECTED.  The SARS-CoV-2 RNA is generally detectable in upper respiratory specimens during the acute phase of infection. The lowest concentration of SARS-CoV-2 viral copies this assay can detect is 138 copies/mL. A negative result does not preclude SARS-Cov-2 infection and should not be used as the sole basis for treatment or other patient management decisions. A negative result may occur with  improper specimen collection/handling, submission of specimen other than nasopharyngeal swab, presence of viral mutation(s) within the areas targeted by this assay, and inadequate number of viral copies(<138 copies/mL). A negative result must be combined with clinical observations, patient history, and epidemiological information. The expected result is Negative.  Fact Sheet for Patients:  EntrepreneurPulse.com.au  Fact Sheet for Healthcare Providers:  IncredibleEmployment.be  This test is no t yet approved or cleared by the [place] FDA and  has been authorized for detection and/or diagnosis of SARS-CoV-2 by FDA under an Emergency Use Authorization (EUA). This EUA will remain  in effect (meaning this test can be used) for the duration of the COVID-19  declaration under Section 564(b)(1) of the Act, 21 U.S.C.section 360bbb-3(b)(1), unless the authorization is terminated  or revoked sooner.       Influenza A by PCR NEGATIVE NEGATIVE Final   Influenza B by PCR NEGATIVE NEGATIVE Final    Comment: (NOTE) The Xpert Xpress SARS-CoV-2/FLU/RSV plus assay is intended as an aid in the diagnosis of influenza from Nasopharyngeal swab specimens and should not be used as a sole basis for treatment. Nasal washings and aspirates are unacceptable for Xpert Xpress SARS-CoV-2/FLU/RSV testing.  Fact Sheet for Patients: EntrepreneurPulse.com.au  Fact Sheet for Healthcare Providers: IncredibleEmployment.be  This test is not yet approved or cleared by the [place] FDA and has been authorized for detection and/or diagnosis of SARS-CoV-2 by FDA under an Emergency Use Authorization (EUA). This EUA will remain in effect (meaning this test can be used) for the duration of the COVID-19 declaration under Section 564(b)(1) of the Act, 21 U.S.C. section 360bbb-3(b)(1), unless the authorization is terminated or revoked.     Resp Syncytial Virus by PCR NEGATIVE NEGATIVE Final    Comment: (NOTE) Fact Sheet for Patients: EntrepreneurPulse.com.au  Fact Sheet for Healthcare Providers: IncredibleEmployment.be  This test is not yet approved or cleared by the [place] FDA and has been authorized for detection and/or diagnosis of SARS-CoV-2 by FDA under an Emergency Use Authorization (EUA). This EUA will remain in effect (meaning this test can be used) for the duration of the COVID-19 declaration under Section 564(b)(1) of the Act, 21 U.S.C. section 360bbb-3(b)(1), unless the authorization is terminated or revoked.  Performed at [facility], [address] [place], [place] [address]   Blood Culture (routine x 2)     Status: None (Preliminary result)   Collection Time: 09/15/22   1:59 PM   Specimen: BLOOD LEFT HAND  Result  Value Ref Range Status   Specimen Description BLOOD LEFT HAND  Final   Special Requests   Final    BOTTLES DRAWN AEROBIC AND ANAEROBIC Blood Culture adequate volume   Culture   Final    NO GROWTH 4 DAYS Performed at [facility], [address], [place], [place] [address]    Report Status PENDING  Incomplete  Blood Culture (routine x 2)     Status: None (Preliminary result)   Collection Time: 09/15/22  2:07 PM   Specimen: BLOOD LEFT ARM  Result Value Ref Range Status   Specimen Description BLOOD LEFT ARM  Final   Special Requests   Final    BOTTLES DRAWN AEROBIC AND ANAEROBIC Blood Culture adequate volume   Culture   Final    NO GROWTH 4 DAYS Performed at [facility], [address], [place], [place] [address]    Report Status PENDING  Incomplete     Labs: BNP (last 3 results) Recent Labs    09/15/22 0902  BNP [id number]*   Basic Metabolic Panel: Recent Labs  Lab 09/15/22 0902 09/16/22 0431 09/17/22 0430  NA 133* 137 136  K 3.9 3.8 3.6  CL 103 106 105  CO2 22 24 24   GLUCOSE 119* 105* 125*  BUN 15 12 12   CREATININE 0.58* 0.67 0.70  CALCIUM 9.1 9.2 8.9  MG 1.9 2.0 1.7   Liver Function Tests: Recent Labs  Lab 09/15/22 0902 09/16/22 0431  AST 44* 42*  ALT 39 37  ALKPHOS 165* 173*  BILITOT 1.2 1.1  PROT 5.9* 5.7*  ALBUMIN 2.7* 2.5*   No results for input(s): "LIPASE", "AMYLASE" in the last 168 hours. No results for input(s): "AMMONIA" in the last 168 hours. CBC: Recent Labs  Lab 09/15/22 0902 09/16/22 0431 09/17/22 0430  WBC 21.5* 19.7* 23.3*  NEUTROABS 16.7* 15.0* 17.9*  HGB 14.0 12.2* 11.4*  HCT 44.3 39.3 36.1*  MCV 85.9 87.5 87.2  PLT 179 174 197   Cardiac Enzymes: No results for input(s): "CKTOTAL", "CKMB", "CKMBINDEX", "TROPONINI" in the last 168 hours. BNP: Invalid input(s): "POCBNP" CBG: Recent Labs  Lab 09/18/22 0806 09/18/22 1622 09/18/22 2116 09/19/22 0731  GLUCAP 121* 114* 121* 113*    D-Dimer No results for input(s): "DDIMER" in the last 72 hours. Hgb A1c No results for input(s): "HGBA1C" in the last 72 hours. Lipid Profile No results for input(s): "CHOL", "HDL", "LDLCALC", "TRIG", "CHOLHDL", "LDLDIRECT" in the last 72 hours. Thyroid function studies No results for input(s): "TSH", "T4TOTAL", "T3FREE", "THYROIDAB" in the last 72 hours.  Invalid input(s): "FREET3" Anemia work up No results for input(s): "VITAMINB12", "FOLATE", "FERRITIN", "TIBC", "IRON", "RETICCTPCT" in the last 72 hours. Urinalysis    Component Value Date/Time   COLORURINE AMBER (A) 09/15/2022 0933   APPEARANCEUR HAZY (A) 09/15/2022 0933   LABSPEC 1.020 09/15/2022 0933   PHURINE 5.0 09/15/2022 0933   GLUCOSEU NEGATIVE 09/15/2022 0933   HGBUR NEGATIVE 09/15/2022 0933   BILIRUBINUR NEGATIVE 09/15/2022 0933   KETONESUR NEGATIVE 09/15/2022 0933   PROTEINUR NEGATIVE 09/15/2022 0933   NITRITE NEGATIVE 09/15/2022 0933   LEUKOCYTESUR NEGATIVE 09/15/2022 0933   Sepsis Labs Recent Labs  Lab 09/15/22 0902 09/16/22 0431 09/17/22 0430  WBC 21.5* 19.7* 23.3*   Microbiology Recent Results (from the past 240 hour(s))  Resp panel by RT-PCR (RSV, Flu A&B, Covid) Anterior Nasal Swab     Status: None   Collection Time: 09/15/22  9:29 AM   Specimen: Anterior Nasal Swab  Result Value Ref Range  Status   SARS Coronavirus 2 by RT PCR NEGATIVE NEGATIVE Final    Comment: (NOTE) SARS-CoV-2 target nucleic acids are NOT DETECTED.  The SARS-CoV-2 RNA is generally detectable in upper respiratory specimens during the acute phase of infection. The lowest concentration of SARS-CoV-2 viral copies this assay can detect is 138 copies/mL. A negative result does not preclude SARS-Cov-2 infection and should not be used as the sole basis for treatment or other patient management decisions. A negative result may occur with  improper specimen collection/handling, submission of specimen other than nasopharyngeal swab,  presence of viral mutation(s) within the areas targeted by this assay, and inadequate number of viral copies(<138 copies/mL). A negative result must be combined with clinical observations, patient history, and epidemiological information. The expected result is Negative.  Fact Sheet for Patients:  EntrepreneurPulse.com.au  Fact Sheet for Healthcare Providers:  IncredibleEmployment.be  This test is no t yet approved or cleared by the [place] FDA and  has been authorized for detection and/or diagnosis of SARS-CoV-2 by FDA under an Emergency Use Authorization (EUA). This EUA will remain  in effect (meaning this test can be used) for the duration of the COVID-19 declaration under Section 564(b)(1) of the Act, 21 U.S.C.section 360bbb-3(b)(1), unless the authorization is terminated  or revoked sooner.       Influenza A by PCR NEGATIVE NEGATIVE Final   Influenza B by PCR NEGATIVE NEGATIVE Final    Comment: (NOTE) The Xpert Xpress SARS-CoV-2/FLU/RSV plus assay is intended as an aid in the diagnosis of influenza from Nasopharyngeal swab specimens and should not be used as a sole basis for treatment. Nasal washings and aspirates are unacceptable for Xpert Xpress SARS-CoV-2/FLU/RSV testing.  Fact Sheet for Patients: EntrepreneurPulse.com.au  Fact Sheet for Healthcare Providers: IncredibleEmployment.be  This test is not yet approved or cleared by the [place] FDA and has been authorized for detection and/or diagnosis of SARS-CoV-2 by FDA under an Emergency Use Authorization (EUA). This EUA will remain in effect (meaning this test can be used) for the duration of the COVID-19 declaration under Section 564(b)(1) of the Act, 21 U.S.C. section 360bbb-3(b)(1), unless the authorization is terminated or revoked.     Resp Syncytial Virus by PCR NEGATIVE NEGATIVE Final    Comment: (NOTE) Fact Sheet for  Patients: EntrepreneurPulse.com.au  Fact Sheet for Healthcare Providers: IncredibleEmployment.be  This test is not yet approved or cleared by the [place] FDA and has been authorized for detection and/or diagnosis of SARS-CoV-2 by FDA under an Emergency Use Authorization (EUA). This EUA will remain in effect (meaning this test can be used) for the duration of the COVID-19 declaration under Section 564(b)(1) of the Act, 21 U.S.C. section 360bbb-3(b)(1), unless the authorization is terminated or revoked.  Performed at [facility], [address], [place], [place] [address]   Blood Culture (routine x 2)     Status: None (Preliminary result)   Collection Time: 09/15/22  1:59 PM   Specimen: BLOOD LEFT HAND  Result Value Ref Range Status   Specimen Description BLOOD LEFT HAND  Final   Special Requests   Final    BOTTLES DRAWN AEROBIC AND ANAEROBIC Blood Culture adequate volume   Culture   Final    NO GROWTH 4 DAYS Performed at [facility], [address], [place], [place] [address]    Report Status PENDING  Incomplete  Blood Culture (routine x 2)     Status: None (Preliminary result)   Collection Time: 09/15/22  2:07 PM  Specimen: BLOOD LEFT ARM  Result Value Ref Range Status   Specimen Description BLOOD LEFT ARM  Final   Special Requests   Final    BOTTLES DRAWN AEROBIC AND ANAEROBIC Blood Culture adequate volume   Culture   Final    NO GROWTH 4 DAYS Performed at [facility], [address] [place], [place] [address]    Report Status PENDING  Incomplete    Time coordinating discharge: 38 mins   SIGNED:  Irwin Brakeman, MD  [facility] 09/19/2022, 10:52 AM How to contact the [facility] Attending or Consulting provider [employer] or covering provider during after hours [place], for this patient?  Check the care team in [facility] and look for a) attending/consulting [facility] provider listed and b) the [facility] team listed Log into [email or web address] and  use [place]'s universal password to access. If you do not have the password, please contact the hospital operator. Locate the [facility] provider you are looking for under [facility] and page to a number that you can be directly reached. If you still have difficulty reaching the provider, please page the [facility] (Director on Call) for the Hospitalists listed on amion for assistance.

## 2022-09-20 LAB — CULTURE, BLOOD (ROUTINE X 2)
Culture: NO GROWTH
Culture: NO GROWTH
Special Requests: ADEQUATE
Special Requests: ADEQUATE

## 2022-09-21 LAB — SURGICAL PATHOLOGY

## 2022-09-23 DIAGNOSIS — C259 Malignant neoplasm of pancreas, unspecified: Secondary | ICD-10-CM | POA: Insufficient documentation

## 2022-09-23 NOTE — Progress Notes (Signed)
[facility] [address], [place] [address]   Clinic Day:  09/24/2022  Referring physician: Carrolyn Meiers*  Patient Care Team: Carrolyn Meiers, MD as PCP - General (Internal Medicine) Gala Romney, Cristopher Estimable, MD as Consulting Physician (Gastroenterology) Derek Jack, MD as Medical Oncologist (Medical Oncology) Brien Mates, RN as Oncology Nurse Navigator (Medical Oncology)   ASSESSMENT & PLAN:   Assessment:  1.  Metastatic pancreatic cancer to the liver: - Presentation with 57-month history of epigastric and right upper quadrant pain, difficulty breathing.  Also decreased eating, decreased appetite and taste changes. - CT [facility] (09/15/2022): Hypodense mass of the pancreatic tail measuring 6 x 5 cm involving the closely adjacent spleen hilum.  Numerous hypodense lesions throughout the liver, largest in the anterior liver dome measuring 4.8 x 4.2 cm.  Severe hepatomegaly with maximal coronal span 26.5 cm.  Splenomegaly.  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 - CT chest angio (09/15/2022): Negative for PE.  Trace right pleural effusion with associated scarring of the right lung base. - US biopsy of the liver lesion on 09/18/2022.  CA 19-9: [id number] - Pathology of liver biopsy: Poorly differentiated adenocarcinoma morphologically consistent with pancreatic primary.  2.  Social/family history: - He lives at home with her Lorin Picket who is seen with her today.  He is currently working as a Corporate treasurer for the past 3 years.  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  He was also retired Radio producer.  Quit smoking in 1989. - Sister had kidney and lung cancer.  Brother had prostate and kidney cancer.  Father had myasthenia gravis.  Maternal uncle had lung cancer.   Plan:  1.  Metastatic pancreatic cancer to the liver: - We have reviewed scans with the  patient and his sister. - I have reviewed pathology report.  I have reviewed the new diagnosis and natural history of metastatic prostate cancer. - We discussed options including best supportive care in the form of hospice versus active treatment with chemotherapy. - We also discussed sending biopsy sample for NGS testing to obtain more information on biomarker directed therapy. - I have also recommended germline mutation testing which can also increase treatment options. - Upon lengthy discussion, patient would like to proceed with active therapy. - I have recommended platinum based regimen with FOLFIRINOX.  We will most likely start with FOLFOX and add Irinotecan if he tolerates well. - Recommend port placement.  2.  Nutrition: - I have recommended that he drink boost/Ensure high-protein up to 5 cans/day if he is not eating much of solid foods.  3.  Atrial fibrillation: - Continue Cardizem 300 mg daily.  Heart rate is well-controlled at 98.   Orders Placed This Encounter  Procedures   IR IMAGING GUIDED PORT INSERTION    Standing Status:   Future    Standing Expiration Date:   09/24/2023    Order Specific Question:   Reason for Exam (SYMPTOM  OR DIAGNOSIS REQUIRED)    Answer:   chemotherapy administration    Order Specific Question:   Preferred Imaging Location?    Answer:   [facility]    Order Specific Question:   Release to patient    Answer:   Immediate   Ambulatory Referral to [facility] Nutrition    Referral Priority:   Routine    Referral Type:   Consultation  Referral Reason:   Specialty Services Required    Number of Visits Requested:   1   Ambulatory referral to Social Work    Referral Priority:   Routine    Referral Type:   Consultation    Referral Reason:   Specialty Services Required    Number of Visits Requested:   1   Ambulatory referral to Genetics    Referral Priority:   Routine    Referral Type:   Consultation    Referral Reason:   Specialty Services  Required    Number of Visits Requested:   1      I,Katie Daubenspeck,acting as a scribe for Derek Jack, MD.,have documented all relevant documentation on the behalf of Derek Jack, MD,as directed by  Derek Jack, MD while in the presence of Derek Jack, MD.   I, Derek Jack MD, have reviewed the above documentation for accuracy and completeness, and I agree with the above.   Derek Jack, MD   4/4/20246:48 PM  CHIEF COMPLAINT/PURPOSE OF CONSULT:   Diagnosis: Metastatic pancreatic adenocarcinoma to the liver   Cancer Staging  Pancreatic adenocarcinoma Staging form: Exocrine Pancreas, AJCC 8th Edition - Clinical stage from 09/23/2022: Stage IV (cT3, cN0, pM1) - Unsigned    Prior Therapy: None  Current Therapy: FOLFIRINOX   HISTORY OF PRESENT ILLNESS:   Oncology History   No history exists.      Craig Cole is a 62 y.o. male presenting to clinic today for evaluation of metastatic pancreatic cancer to the liver at the request of [facility] hospitalists.  He presented to the ED on 09/15/22 with shortness of breath and intermittent abdominal pain. He was seen by GI on 07/21/22 for abdominal pain and was recommended to take MiraLAX daily. He underwent chest x-ray on 08/24/22 for shortness of breath, and this was negative. In the ED on 09/15/22, he underwent angio chest CT showing: negative for PE; trace right pleural effusion with associated scarring and/or atelectasis of right lung base; numerous hypodense lesions throughout liver, as well as a mass of pancreatic tail. A CT A/P was performed at the same time and showed: 6 cm hypodense mass arising from tip of pancreatic tail and involving closely adjacent splenic hilum; numerous hypodense metastatic lesions throughout liver; severe hepatomegaly and splenomegaly; splenic and gastric varices.  This prompted admission for further evaluation. A CA 19-9 was obtained and showed significant elevation to 4,718.  A repeat taken the following day confirmed elevation. He proceeded to liver biopsy on 09/18/22 as inpatient. Pathology revealed: poorly differentiated adenocarcinoma, morphologically consistent with clinical impression of a pancreatic primary.  Today, he states that he is doing well overall. His appetite level is at 25%. His energy level is at 25%.  PAST MEDICAL HISTORY:   Past Medical History: Past Medical History:  Diagnosis Date   Cancer ([place])    skin cancer   GERD (gastroesophageal reflux disease)    Glaucoma    High risk sexual behavior    on PrEP for HIV   Hyperlipidemia    Hypertension    Sleep apnea     Surgical History: Past Surgical History:  Procedure Laterality Date   COLONOSCOPY  05/25/2012   [facility]; rare diverticulum.   COLONOSCOPY WITH PROPOFOL N/A 04/28/2021   Procedure: COLONOSCOPY WITH PROPOFOL;  Surgeon: Daneil Dolin, MD;  Location: [facility]O SUITE;  Service: Endoscopy;  Laterality: N/A;  8:45am   POLYPECTOMY  04/28/2021   Procedure: POLYPECTOMY;  Surgeon: Daneil Dolin,  MD;  Location: [facility];  Service: Endoscopy;;   RETINAL DETACHMENT SURGERY Left    TONSILLECTOMY      Social History: Social History   Socioeconomic History   Marital status: Single    Spouse name: Not on file   Number of children: Not on file   Years of education: Not on file   Highest education level: Not on file  Occupational History   Not on file  Tobacco Use   Smoking status: Former    Packs/day: 1    Types: Cigarettes    Quit date: 1999    Years since quitting: 25.2   Smokeless tobacco: Never  Vaping Use   Vaping Use: Never used  Substance and Sexual Activity   Alcohol use: Not Currently    Comment: rare   Drug use: Never   Sexual activity: Not Currently  Other Topics Concern   Not on file  Social History Narrative   Not on file   Social Determinants of Health   Financial Resource Strain: Not on file  Food Insecurity:  No Food Insecurity (09/24/2022)   Hunger Vital Sign    Worried About Running Out of Food in the Last Year: Never true    Ran Out of Food in the Last Year: Never true  Transportation Needs: No Transportation Needs (09/24/2022)   PRAPARE - Hydrologist (Medical): No    Lack of Transportation (Non-Medical): No  Physical Activity: Not on file  Stress: Not on file  Social Connections: Not on file  Intimate Partner Violence: Not At Risk (09/24/2022)   Humiliation, Afraid, Rape, and Kick questionnaire    Fear of Current or Ex-Partner: No    Emotionally Abused: No    Physically Abused: No    Sexually Abused: No    Family History: Family History  Problem Relation Age of Onset   Hypertension Mother    Diabetes Mother    Myasthenia gravis Father    Prostate cancer Brother    Colon cancer Neg Hx     Current Medications:  Current Outpatient Medications:    amoxicillin-clavulanate (AUGMENTIN) 875-125 MG tablet, Take 1 tablet by mouth every 12 (twelve) hours for 5 days., Disp: 10 tablet, Rfl: 0   aspirin EC 81 MG tablet, Take 81 mg by mouth every evening., Disp: , Rfl:    atorvastatin (LIPITOR) 20 MG tablet, Take 1 tablet (20 mg total) by mouth daily., Disp: 30 tablet, Rfl: 2   diltiazem (CARDIZEM CD) 300 MG 24 hr capsule, Take 1 capsule (300 mg total) by mouth daily., Disp: 30 capsule, Rfl: 2   emtricitabine-tenofovir AF (DESCOVY) 200-25 MG tablet, Take 1 tablet by mouth daily., Disp: 90 tablet, Rfl: 0   esomeprazole (NEXIUM) 40 MG capsule, Take 40 mg by mouth every morning., Disp: , Rfl:    fluticasone (FLONASE) 50 MCG/ACT nasal spray, Place 2 sprays into both nostrils daily for 14 days., Disp: 11 mL, Rfl: 1   furosemide (LASIX) 20 MG tablet, TAKE 1 TABLET BY MOUTH DAILY AS NEEDED FOR LOWER LEG SWELLING, Disp: , Rfl:    hydrALAZINE (APRESOLINE) 25 MG tablet, Take 25 mg by mouth 2 (two) times daily., Disp: , Rfl:    Metoprolol Tartrate 75 MG TABS, Take 1 tablet (75 mg  total) by mouth 2 (two) times daily., Disp: 60 tablet, Rfl: 2   montelukast (SINGULAIR) 10 MG tablet, Take 10 mg by mouth at bedtime., Disp: , Rfl:    Multiple Vitamins-Minerals (ZINC  PO), Take 1 tablet by mouth daily., Disp: , Rfl:    ondansetron (ZOFRAN-ODT) 4 MG disintegrating tablet, Take 1 tablet (4 mg total) by mouth every 8 (eight) hours as needed for nausea or vomiting., Disp: 20 tablet, Rfl: 0   promethazine-dextromethorphan (PROMETHAZINE-DM) 6.25-15 MG/5ML syrup, Take 5 mLs by mouth at bedtime as needed for cough. Do not take with alcohol or while driving or operating heavy machinery.  May cause drowsiness., Disp: 118 mL, Rfl: 0   tadalafil (CIALIS) 5 MG tablet, Take 5 mg by mouth in the morning., Disp: , Rfl:    Tafluprost, PF, 0.0015 % SOLN, Place 1 drop into both eyes at bedtime., Disp: , Rfl:    tamsulosin (FLOMAX) 0.4 MG CAPS capsule, Take 0.4 mg by mouth at bedtime., Disp: , Rfl:    Allergies: No Known Allergies  REVIEW OF SYSTEMS:   Review of Systems  Constitutional:  Negative for chills, fatigue and fever.  HENT:   Negative for lump/mass, mouth sores, nosebleeds, sore throat and trouble swallowing.   Eyes:  Negative for eye problems.  Respiratory:  Positive for cough and shortness of breath.   Cardiovascular:  Negative for chest pain, leg swelling and palpitations.  Gastrointestinal:  Positive for nausea. Negative for abdominal pain, constipation, diarrhea and vomiting.  Genitourinary:  Negative for bladder incontinence, difficulty urinating, dysuria, frequency, hematuria and nocturia.   Musculoskeletal:  Negative for arthralgias, back pain, flank pain, myalgias and neck pain.  Skin:  Negative for itching and rash.  Neurological:  Positive for dizziness. Negative for headaches and numbness.  Hematological:  Does not bruise/bleed easily.  Psychiatric/Behavioral:  Negative for depression, sleep disturbance and suicidal ideas. The patient is not nervous/anxious.   All other  systems reviewed and are negative.    VITALS:   Blood pressure 96/66, pulse 98, temperature 98 F (36.7 C), temperature source Oral, resp. rate 18, height 6\' 5"  (1.956 m), weight (!) 326 lb 14.4 oz (148.3 kg), SpO2 93 %.  Wt Readings from Last 3 Encounters:  09/24/22 (!) 326 lb 14.4 oz (148.3 kg)  09/19/22 (!) 336 lb 6.8 oz (152.6 kg)  08/31/22 (!) 337 lb (152.9 kg)    Body mass index is 38.76 kg/m.  Performance status (ECOG): 1 - Symptomatic but completely ambulatory  PHYSICAL EXAM:   Physical Exam Vitals and nursing note reviewed. Exam conducted with a chaperone present.  Constitutional:      Appearance: Normal appearance.  Cardiovascular:     Rate and Rhythm: Normal rate and regular rhythm.     Pulses: Normal pulses.     Heart sounds: Normal heart sounds.  Pulmonary:     Effort: Pulmonary effort is normal.     Breath sounds: Normal breath sounds.  Abdominal:     Palpations: Abdomen is soft. There is no hepatomegaly, splenomegaly or mass.     Tenderness: There is no abdominal tenderness.  Musculoskeletal:     Right lower leg: No edema.     Left lower leg: No edema.  Lymphadenopathy:     Cervical: No cervical adenopathy.     Right cervical: No superficial, deep or posterior cervical adenopathy.    Left cervical: No superficial, deep or posterior cervical adenopathy.     Upper Body:     Right upper body: No supraclavicular or axillary adenopathy.     Left upper body: No supraclavicular or axillary adenopathy.  Neurological:     General: No focal deficit present.     Mental Status: He is  alert and oriented to person, place, and time.  Psychiatric:        Mood and Affect: Mood normal.        Behavior: Behavior normal.     LABS:      Latest Ref Rng & Units 09/17/2022    4:30 AM 09/16/2022    4:31 AM 09/15/2022    9:02 AM  CBC  WBC 4.0 - 10.5 K/uL 23.3  19.7  21.5   Hemoglobin 13.0 - 17.0 g/dL 11.4  12.2  14.0   Hematocrit 39.0 - 52.0 % 36.1  39.3  44.3    Platelets 150 - 400 K/uL 197  174  179       Latest Ref Rng & Units 09/17/2022    4:30 AM 09/16/2022    4:31 AM 09/15/2022    9:02 AM  CMP  Glucose 70 - 99 mg/dL 125  105  119   BUN 8 - 23 mg/dL 12  12  15    Creatinine 0.61 - 1.24 mg/dL 0.70  0.67  0.58   Sodium 135 - 145 mmol/L 136  137  133   Potassium 3.5 - 5.1 mmol/L 3.6  3.8  3.9   Chloride 98 - 111 mmol/L 105  106  103   CO2 22 - 32 mmol/L 24  24  22    Calcium 8.9 - 10.3 mg/dL 8.9  9.2  9.1   Total Protein 6.5 - 8.1 g/dL  5.7  5.9   Total Bilirubin 0.3 - 1.2 mg/dL  1.1  1.2   Alkaline Phos 38 - 126 U/L  173  165   AST 15 - 41 U/L  42  44   ALT 0 - 44 U/L  37  39      No results found for: "CEA1", "CEA" / No results found for: "CEA1", "CEA" No results found for: "PSA1" Lab Results  Component Value Date   CAN199 4,573 (H) 09/16/2022   No results found for: "[id number]"  No results found for: "TOTALPROTELP", "ALBUMINELP", "A1GS", "A2GS", "BETS", "BETA2SER", "GAMS", "MSPIKE", "SPEI" No results found for: "TIBC", "FERRITIN", "IRONPCTSAT" No results found for: "LDH"   STUDIES:   US BIOPSY (LIVER)  Result Date: 09/18/2022 CLINICAL DATA:  Abdominal pain, CT demonstrates pancreatic mass with multiple liver lesions, biopsy requested EXAM: ULTRASOUND-GUIDED CORE LIVER BIOPSY TECHNIQUE: An ultrasound guided liver biopsy was thoroughly discussed with the patient and questions were answered. The benefits, risks, alternatives, and complications were also discussed. The patient understands and wishes to proceed with the procedure. A verbal as well as written consent was obtained. Survey ultrasound of the liver was performed, representative right lobe lesion localized, and an appropriate skin entry site was determined. Skin site was marked, prepped with chlorhexidine, and draped in usual sterile fashion, and infiltrated locally with 1% lidocaine. Intravenous Fentanyl 42mcg and Versed 1mg  were administered as conscious sedation during  continuous monitoring of the patient's level of consciousness and physiological / cardiorespiratory status by the radiology RN, with a total moderate sedation time of 13 minutes. A 17 gauge trocar needle was advanced under ultrasound guidance into the liver to the margin of the lesion. 3 solid-appearing 2 cm coaxial 18gauge core samples were then obtained through the guide needle. The guide needle was removed. Post procedure scans demonstrate no apparent complication. COMPLICATIONS: COMPLICATIONS None immediate FINDINGS: Multiple liver lesions localized. Representative core biopsy of a right lesion were obtained as above. IMPRESSION: Technically successful ultrasound guided core biopsy of right hepatic lesion. Electronically Signed  By: Lucrezia Europe M.D.   On: 09/18/2022 11:40   ECHOCARDIOGRAM COMPLETE  Result Date: 09/16/2022    ECHOCARDIOGRAM REPORT   Patient Name:   Craig Cole Date of Exam: 09/16/2022 Medical Rec #:  [id number]     Height:       77.0 in Accession #:    [id number]    Weight:       332.9 lb Date of Birth:  [birth year]     BSA:          2.778 m Patient Age:    62 years      BP:           112/67 mmHg Patient Gender: M             HR:           92 bpm. Exam Location:  [facility] Procedure: 2D Echo, Color Doppler, Cardiac Doppler and Intracardiac            Opacification Agent Indications:    Atrial Fibrillation I48.91  History:        Patient has no prior history of Echocardiogram examinations.                 Arrythmias:Atrial Fibrillation; Risk Factors:Hypertension,                 Dyslipidemia, Former Smoker and Sleep Apnea.  Sonographer:    Greer Pickerel Referring Phys: [id number] ASIA B Allen  Sonographer Comments: No subcostal window. Image acquisition challenging due to patient body habitus and Image acquisition challenging due to respiratory motion. IMPRESSIONS  1. Left ventricular ejection fraction, by estimation, is 55 to 60%. The left ventricle has normal function. The left ventricle  has no regional wall motion abnormalities. There is mild left ventricular hypertrophy. Left ventricular diastolic parameters are indeterminate.  2. RV-RA gradient 25 mmHg suggesting normal estimated RVSP presuming normal CVP. Right ventricular systolic function is normal. The right ventricular size is normal.  3. The mitral valve is grossly normal. Trivial mitral valve regurgitation.  4. The aortic valve is tricuspid. There is mild calcification of the aortic valve. Aortic valve regurgitation is not visualized. Aortic valve sclerosis is present, with no evidence of aortic valve stenosis.  5. Unable to estimate CVP. Comparison(s): No prior Echocardiogram. FINDINGS  Left Ventricle: Left ventricular ejection fraction, by estimation, is 55 to 60%. The left ventricle has normal function. The left ventricle has no regional wall motion abnormalities. Definity contrast agent was given IV to delineate the left ventricular  endocardial borders. The left ventricular internal cavity size was normal in size. There is mild left ventricular hypertrophy. Abnormal (paradoxical) septal motion, consistent with left bundle branch block. Left ventricular diastolic function could not be evaluated due to atrial fibrillation. Left ventricular diastolic parameters are indeterminate. Right Ventricle: RV-RA gradient 25 mmHg suggesting normal estimated RVSP presuming normal CVP. The right ventricular size is normal. No increase in right ventricular wall thickness. Right ventricular systolic function is normal. Left Atrium: Left atrial size was normal in size. Right Atrium: Right atrial size was normal in size. Pericardium: There is no evidence of pericardial effusion. Mitral Valve: The mitral valve is grossly normal. Trivial mitral valve regurgitation. Tricuspid Valve: The tricuspid valve is grossly normal. Tricuspid valve regurgitation is mild. Aortic Valve: The aortic valve is tricuspid. There is mild calcification of the aortic valve.  Aortic valve regurgitation is not visualized. Aortic valve sclerosis is present, with no evidence of aortic valve stenosis. Pulmonic Valve:  The pulmonic valve was grossly normal. Pulmonic valve regurgitation is trivial. Aorta: The aortic root is normal in size and structure. Venous: Unable to estimate CVP. The inferior vena cava was not well visualized. IAS/Shunts: No atrial level shunt detected by color flow Doppler.  LEFT VENTRICLE PLAX 2D LVIDd:         4.80 cm   Diastology LVIDs:         3.20 cm   LV e' medial:    11.20 cm/s LV PW:         1.20 cm   LV E/e' medial:  9.9 LV IVS:        0.90 cm   LV e' lateral:   12.10 cm/s LVOT diam:     2.40 cm   LV E/e' lateral: 9.2 LV SV:         68 LV SV Index:   25 LVOT Area:     4.52 cm  RIGHT VENTRICLE RV S prime:     15.20 cm/s TAPSE (M-mode): 1.9 cm LEFT ATRIUM             Index        RIGHT ATRIUM           Index LA diam:        5.20 cm 1.87 cm/m   RA Area:     18.00 cm LA Vol (A2C):   52.5 ml 18.90 ml/m  RA Volume:   41.60 ml  14.98 ml/m LA Vol (A4C):   75.8 ml 27.29 ml/m LA Biplane Vol: 69.6 ml 25.06 ml/m  AORTIC VALVE             PULMONIC VALVE LVOT Vmax:   99.00 cm/s  PR End Diast Vel: 1.52 msec LVOT Vmean:  66.800 cm/s LVOT VTI:    0.151 m  AORTA Ao Root diam: 3.80 cm Ao Asc diam:  3.90 cm MITRAL VALVE                TRICUSPID VALVE MV Area (PHT): 4.49 cm     TR Peak grad:   25.0 mmHg MV Decel Time: 169 msec     TR Vmax:        250.00 cm/s MR Peak grad: 20.5 mmHg MR Vmax:      226.45 cm/s   SHUNTS MV E velocity: 111.00 cm/s  Systemic VTI:  0.15 m MV A velocity: 42.20 cm/s   Systemic Diam: 2.40 cm MV E/A ratio:  2.63 Rozann Lesches MD Electronically signed by Rozann Lesches MD Signature Date/Time: 09/16/2022/11:13:36 AM    Final    CT ABDOMEN PELVIS W CONTRAST  Result Date: 09/15/2022 CLINICAL DATA:  Abdominal pain, shortness of breath, lethargy and loss of appetite for 3 months * Tracking Code: BO * EXAM: CT ABDOMEN AND PELVIS WITH CONTRAST TECHNIQUE:  Multidetector CT imaging of the abdomen and pelvis was performed using the standard protocol following bolus administration of intravenous contrast. RADIATION DOSE REDUCTION: This exam was performed according to the departmental dose-optimization program which includes automated exposure control, adjustment of the mA and/or kV according to patient size and/or use of iterative reconstruction technique. CONTRAST:  192mL OMNIPAQUE IOHEXOL 350 MG/ML SOLN COMPARISON:  None Available. FINDINGS: Lower chest: Please see separately reported examination of the chest. Hepatobiliary: Numerous hypodense lesions throughout the liver, largest lesion in the anterior liver dome, hepatic segment VIII measuring 4.8 x 4.2 cm (series 4, image 43). Severe hepatomegaly, maximum coronal span 26.5 cm (series 8, image 64). No gallstones,  gallbladder wall thickening, or biliary dilatation. Pancreas: Hypodense mass arising from the tip of the pancreatic tail and involving the closely adjacent splenic hilum measuring 6.0 x 5.0 cm (series 4, image 40). No pancreatic ductal dilatation or surrounding inflammatory changes. Spleen: Splenomegaly, maximum coronal span 16.0 cm. Adrenals/Urinary Tract: Adrenal glands are unremarkable. Simple, benign bilateral renal cortical cysts, for which no further follow-up or characterization is required. Kidneys are otherwise normal, without renal calculi, solid lesion, or hydronephrosis. Bladder is unremarkable. Stomach/Bowel: Stomach is within normal limits. Appendix appears normal. No evidence of bowel wall thickening, distention, or inflammatory changes. Vascular/Lymphatic: Aortic atherosclerosis. 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 No enlarged abdominal or pelvic lymph nodes. Reproductive: No mass or other significant abnormality. Other: No abdominal wall hernia or abnormality. No ascites. Musculoskeletal: No acute or significant osseous findings.  IMPRESSION: 1. Hypodense mass arising from the tip of the pancreatic tail and involving the closely adjacent splenic hilum measuring 6.0 x 5.0 cm consistent with primary pancreatic adenocarcinoma. 2. Numerous hypodense metastatic lesions throughout the liver. 3. Severe hepatomegaly and splenomegaly. 4. 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 Aortic Atherosclerosis (ICD10-I70.0). Electronically Signed   By: Delanna Ahmadi M.D.   On: 09/15/2022 12:50   CT Angio Chest PE W and/or Wo Contrast  Result Date: 09/15/2022 CLINICAL DATA:  PE suspected, shortness of breath for 3 months * Tracking Code: BO * EXAM: CT ANGIOGRAPHY CHEST WITH CONTRAST TECHNIQUE: Multidetector CT imaging of the chest was performed using the standard protocol during bolus administration of intravenous contrast. Multiplanar CT image reconstructions and MIPs were obtained to evaluate the vascular anatomy. RADIATION DOSE REDUCTION: This exam was performed according to the departmental dose-optimization program which includes automated exposure control, adjustment of the mA and/or kV according to patient size and/or use of iterative reconstruction technique. CONTRAST:  123mL OMNIPAQUE IOHEXOL 350 MG/ML SOLN COMPARISON:  None Available. FINDINGS: Cardiovascular: Satisfactory opacification of the pulmonary arteries to the segmental level. No evidence of pulmonary embolism. Normal heart size. Three-vessel coronary artery calcifications. No pericardial effusion. Aortic atherosclerosis. Mediastinum/Nodes: No enlarged mediastinal, hilar, or axillary lymph nodes. Thyroid gland, trachea, and esophagus demonstrate no significant findings. Lungs/Pleura: Trace right pleural effusion with associated scarring and or atelectasis of the right lung base. Upper Abdomen: Numerous hypodense lesions throughout the liver as well as a mass of the pancreatic tail (series 07/24/2003). Musculoskeletal: No chest wall  abnormality. No acute osseous findings. Review of the MIP images confirms the above findings. IMPRESSION: 1. Negative examination for pulmonary embolism. 2. Trace right pleural effusion with associated scarring and or atelectasis of the right lung base. 3. Numerous hypodense lesions throughout the liver as well as a mass of the pancreatic tail. Findings are highly concerning for primary pancreatic malignancy and hepatic metastatic disease; abdominal findings further discussed on forthcoming dedicated, separately reported examination of the abdomen and pelvis. 4. Coronary artery disease. Aortic Atherosclerosis (ICD10-I70.0). Electronically Signed   By: Delanna Ahmadi M.D.   On: 09/15/2022 12:44

## 2022-09-24 ENCOUNTER — Encounter: Payer: Self-pay | Admitting: Hematology

## 2022-09-24 ENCOUNTER — Inpatient Hospital Stay: Payer: No Typology Code available for payment source | Attending: Hematology | Admitting: Hematology

## 2022-09-24 VITALS — BP 96/66 | HR 98 | Temp 98.0°F | Resp 18 | Ht 77.0 in | Wt 326.9 lb

## 2022-09-24 DIAGNOSIS — C252 Malignant neoplasm of tail of pancreas: Secondary | ICD-10-CM | POA: Diagnosis present

## 2022-09-24 DIAGNOSIS — C787 Secondary malignant neoplasm of liver and intrahepatic bile duct: Secondary | ICD-10-CM | POA: Insufficient documentation

## 2022-09-24 DIAGNOSIS — I4891 Unspecified atrial fibrillation: Secondary | ICD-10-CM | POA: Insufficient documentation

## 2022-09-24 DIAGNOSIS — C259 Malignant neoplasm of pancreas, unspecified: Secondary | ICD-10-CM

## 2022-09-24 DIAGNOSIS — Z7982 Long term (current) use of aspirin: Secondary | ICD-10-CM | POA: Diagnosis not present

## 2022-09-24 DIAGNOSIS — Z79899 Other long term (current) drug therapy: Secondary | ICD-10-CM | POA: Diagnosis not present

## 2022-09-24 DIAGNOSIS — Z87891 Personal history of nicotine dependence: Secondary | ICD-10-CM | POA: Diagnosis not present

## 2022-09-24 NOTE — Patient Instructions (Addendum)
[facility]  Discharge Instructions  You were seen and examined today by Dr. Delton Coombes. Dr. Delton Coombes is a medical oncologist, meaning that he specializes in the treatment of cancer diagnoses. Dr. Delton Coombes discussed your past medical history, family history of cancers, and the events that led to you being here today.  Dr. Delton Coombes has reviewed your recent biopsy that revealed pancreatic cancer. Dr. Delton Coombes has ordered additional testing on the tissue that was removed known as Next Generation Sequencing, which looks for targetable mutations present in the cancer.  Your biopsy was removed from the liver, which means that the cancer began in the pancreas and has spread at least to the liver making it a Stage IV Pancreatic Cancer. This means that the cancer cannot be completely cured, but we can discuss treatment options with the hope to control the cancer and help to alleviate any symptoms you are experiencing from the cancer.  Dr. Delton Coombes has recommended starting chemotherapy.  Prior to the start of chemotherapy, you will need a Port-A-Cath placed first.  You will be referred for genetic testing to see if there is any genetic predisposition to cancer development. This can be helpful for you in deciding treatment options as well as for your family members if they need any testing.  Follow-up as scheduled.  Thank you for choosing [facility] to provide your oncology and hematology care.   To afford each patient quality time with our provider, please arrive at least 15 minutes before your scheduled appointment time. You may need to reschedule your appointment if you arrive late (10 or more minutes). Arriving late affects you and other patients whose appointments are after yours.  Also, if you miss three or more appointments without notifying the office, you may be dismissed from the clinic at the provider's discretion.    Again, thank  you for choosing [facility].  Our hope is that these requests will decrease the amount of time that you wait before being seen by our physicians.   If you have a lab appointment with the [facility] - please note that after April 8th, all labs will be drawn in the cancer center.  You do not have to check in or register with the main entrance as you have in the past but will complete your check-in at the cancer center.            _____________________________________________________________  Should you have questions after your visit to [facility], please contact our office at [phone] and follow the prompts.  Our office hours are 8:00 a.m. to 4:30 p.m. Monday - Thursday and 8:00 a.m. to 2:30 p.m. Friday.  Please note that voicemails left after 4:00 p.m. may not be returned until the following business day.  We are closed weekends and all major holidays.  You do have access to a nurse 24-7, just call the main number to the clinic [phone] and do not press any options, hold on the line and a nurse will answer the phone.    For prescription refill requests, have your pharmacy contact our office and allow 72 hours.    Masks are no longer required in the cancer centers. If you would like for your care team to wear a mask while they are taking care of you, please let them know. You may have one support person who is at least 63 years old accompany you for your appointments.

## 2022-09-24 NOTE — Progress Notes (Signed)
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 

## 2022-09-25 ENCOUNTER — Other Ambulatory Visit: Payer: Self-pay

## 2022-09-26 ENCOUNTER — Other Ambulatory Visit: Payer: Self-pay

## 2022-09-30 ENCOUNTER — Inpatient Hospital Stay: Payer: No Typology Code available for payment source

## 2022-09-30 MED ORDER — PROCHLORPERAZINE MALEATE 10 MG PO TABS
10.0000 mg | ORAL_TABLET | Freq: Four times a day (QID) | ORAL | 1 refills | Status: DC | PRN
Start: 2022-09-30 — End: 2022-10-07

## 2022-09-30 MED ORDER — LIDOCAINE-PRILOCAINE 2.5-2.5 % EX CREA
TOPICAL_CREAM | CUTANEOUS | 3 refills | Status: DC
Start: 2022-09-30 — End: 2022-10-05

## 2022-09-30 NOTE — Patient Instructions (Addendum)
[facility] Chemotherapy Teaching   Dr. Ellin Saba has reviewed your recent biopsy that revealed pancreatic cancer. Dr. Ellin Saba has ordered additional testing on the tissue that was removed known as Next Generation Sequencing, which looks for targetable mutations present in the cancer.   Your biopsy was removed from the liver, which means that the cancer began in the pancreas and has spread at least to the liver making it a Stage IV Pancreatic Cancer. This means that the cancer cannot be completely cured, but we can discuss treatment options with the hope to control the cancer and help to alleviate any symptoms you are experiencing from the cancer.     Dr. Ellin Saba has recommended starting chemotherapy.  You will be receiving Oxaliplatin, Irinotecan, Leucovorin, and Adruicil every 14 days.  You will also go home with a chemotherapy pump for two days.    You will see the doctor regularly throughout treatment.  We will obtain blood work from you prior to every treatment and monitor your results to make sure it is safe to give your treatment. The doctor monitors your response to treatment by the way you are feeling, your blood work, and by obtaining scans periodically.  There will be wait times while you are here for treatment.  It will take about 30 minutes to 1 hour for your lab work to result.  Then there will be wait times while pharmacy mixes your medications.     Medications you will receive in the clinic prior to your chemotherapy medications:  Aloxi:  ALOXI is used in adults to help prevent nausea and vomiting that happens with certain chemotherapy drugs.  Aloxi is a long acting medication, and will remain in your system for about two days.   Emend:  This is an anti-nausea medication that is used with Aloxi to help prevent nausea and vomiting caused by chemotherapy.  Dexamethasone:  This is a steroid given prior to chemotherapy to help prevent allergic reactions; it may also  help prevent and control nausea and diarrhea.    [facility] Chemotherapy Teaching   You will see the doctor regularly throughout treatment.  We will obtain blood work from you prior to every treatment and monitor your results to make sure it is safe to give your treatment. The doctor monitors your response to treatment by the way you are feeling, your blood work, and by obtaining scans periodically.  There will be wait times while you are here for treatment.  It will take about 30 minutes to 1 hour for your lab work to result.  Then there will be wait times while pharmacy mixes your medications.    Oxaliplatin (Eloxatin)  About This Drug  Oxaliplatin is used to treat cancer. It is given in the vein (IV).  It takes two hours to infuse.  Possible Side Effects   Bone marrow suppression. This is a decrease in the number of white blood cells, red blood cells, and platelets. This may raise your risk of infection, make you tired and weak (fatigue), and raise your risk of bleeding.   Tiredness   Soreness of the mouth and throat. You may have red areas, white patches, or sores that hurt.   Nausea and vomiting (throwing up)   Diarrhea (loose bowel movements)   Changes in your liver function   Effects on the nerves called peripheral neuropathy. You may feel numbness, tingling, or pain in your hands and feet, and may be worse in cold temperatures. It  may be hard for you to button your clothes, open jars, or walk as usual. The effect on the nerves may get worse with more doses of the drug. These effects get better in some people after the drug is stopped but it does not get better in all people  Note: Each of the side effects above was reported in 40% or greater of patients treated with oxaliplatin. Not all possible side effects are included above.   Warnings and Precautions   Allergic reactions, including anaphylaxis, which may be life-threatening are rare but may happen in  some patients. Signs of allergic reaction to this drug may be swelling of the face, feeling like your tongue or throat are swelling, trouble breathing, rash, itching, fever, chills, feeling dizzy, and/or feeling that your heart is beating in a fast or not normal way. If this happens, do not take another dose of this drug. You should get urgent medical treatment.   Inflammation (swelling) of the lungs, which may be life-threatening. You may have a dry cough or trouble breathing.   Effects on the nerves (neuropathy) may resolve within 14 days, or it may persist beyond 14 days.   Severe decrease in white blood cells when combined with the chemotherapy agents 5-fluorouracil and leucovorin. This may be life-threatening.   Severe changes in your liver function   Abnormal heart beat and/or EKG, which can be life-threatening   Rhabdomyolysis- damage to your muscles which may release proteins in your blood and affect how your kidneys work, which can be life-threatening. You may have severe muscle weakness and/or pain, or dark urine.  Important Information   This drug may impair your ability to drive or use machinery. Talk to your doctor and/or nurse about precautions you may need to take.   This drug may be present in the saliva, tears, sweat, urine, stool, vomit, semen, and vaginal secretions. Talk to your doctor and/or your nurse about the necessary precautions to take during this time.  * The effects on the nerves can be aggravated by exposure to cold. Avoid cold beverages, use of ice and make sure you cover your skin and dress warmly prior to being exposed to cold temperatures while you are receiving treatment with oxaliplatin*   Treating Side Effects   Manage tiredness by pacing your activities for the day.   Be sure to include periods of rest between energy-draining activities.   To decrease the risk of infection, wash your hands regularly.   Avoid close contact with people who have a  cold, the flu, or other infections.  Take your temperature as your doctor or nurse tells you, and whenever you feel like you may have a fever.   To help decrease the risk of bleeding, use a soft toothbrush. Check with your nurse before using dental floss.   Be very careful when using knives or tools.   Use an electric shaver instead of a razor.   Drink plenty of fluids (a minimum of eight glasses per day is recommended).   Mouth care is very important. Your mouth care should consist of routine, gentle cleaning of your teeth or dentures and rinsing your mouth with a mixture of 1/2 teaspoon of salt in 8 ounces of water or 1/2 teaspoon of baking soda in 8 ounces of water. This should be done at least after each meal and at bedtime.   If you have mouth sores, avoid mouthwash that has alcohol. Also avoid alcohol and smoking because they can bother your  mouth and throat.   To help with nausea and vomiting, eat small, frequent meals instead of three large meals a day. Choose foods and drinks that are at room temperature. Ask your nurse or doctor about other helpful tips and medicine that is available to help stop or lessen these symptoms.   If you throw up or have loose bowel movements, you should drink more fluids so that you do not become dehydrated (lack of water in the body from losing too much fluid).   If you have diarrhea, eat low-fiber foods that are high in protein and calories and avoid foods that can irritate your digestive tracts or lead to cramping.   Ask your nurse or doctor about medicine that can lessen or stop your diarrhea.   If you have numbness and tingling in your hands and feet, be careful when cooking, walking, and handling sharp objects and hot liquids.   Do not drink cold drinks or use ice in beverages. Drink fluids at room temperature or warmer, and drink through a straw.   Wear gloves to touch cold objects, and wear warm clothing and cover you skin during cold  weather.   Food and Drug Interactions   There are no known interactions of oxaliplatin with food and other medications.   This drug may interact with other medicines. Tell your doctor and pharmacist about all the prescription and over-the-counter medicines and dietary supplements (vitamins, minerals, herbs and others) that you are taking at this time. Also, check with your doctor or pharmacist before starting any new prescription or over-the-counter medicines, or dietary supplements to make sure that there are no interactions   When to Call the Doctor  Call your doctor or nurse if you have any of these symptoms and/or any new or unusual symptoms:   Fever of 100.4 F (38 C) or higher   Chills   Tiredness that interferes with your daily activities   Feeling dizzy or lightheaded   Easy bleeding or bruising   Feeling that your heart is beating in a fast or not normal way (palpitations)   Pain in your chest   Dry cough   Trouble breathing   Pain in your mouth or throat that makes it hard to eat or drink   Nausea that stops you from eating or drinking and/or is not relieved by prescribed medicines   Throwing up   Diarrhea, 4 times in one day or diarrhea with lack of strength or a feeling of being dizzy   Numbness, tingling, or pain in your hands and feet   Signs of possible liver problems: dark urine, pale bowel movements, bad stomach pain, feeling very tired and weak, unusual itching, or yellowing of the eyes or skin   Signs of rhabdomyolysis: decreased urine, very dark urine, muscle pain in the shoulders, thighs, or lower back; muscle weakness or trouble moving arms and legs   Signs of allergic reaction: swelling of the face, feeling like your tongue or throat are swelling, trouble breathing, rash, itching, fever, chills, feeling dizzy, and/or feeling that your heart is beating in a fast or not normal way. If this happens, call 911 for emergency care.   If you think you  may be pregnant  Reproduction Warnings   Pregnancy warning: This drug may have harmful effects on the unborn baby. Women of childbearing potential should use effective methods of birth control during your cancer treatment. Let your doctor know right away if you think you may be pregnant or  may have impregnated your partner.   Breastfeeding warning: It is not known if this drug passes into breast milk. For this reason, women should talk to their doctor about the risks and benefits of breastfeeding during treatment with this drug because this drug may enter the breast milk and cause harm to a breastfeeding baby.   Fertility warning: Human fertility studies have not been done with this drug. Talk with your doctor or nurse if you plan to have children. Ask for information on sperm or egg banking.   Irinotecan (Camptosar)    About This Drug  Irinotecan is used to treat cancer. It is given in the vein (IV). It will take 1.5 hours to infuse.    Possible Side Effects   Bone marrow suppression. This is a decrease in the number of white blood cells, red blood cells, and platelets. This may raise your risk of infection, make you tired and weak, and raise your risk of bleeding.    Soreness of the mouth and throat. You may have red areas, white patches, or sores that hurt.    Nausea and vomiting (throwing up)    Pain in your abdomen  Diarrhea (loose bowel movements)    Constipation (unable to move bowels)    Decreased appetite (decreased hunger)    Weight loss    Changes in your liver function    Pain    Weakness    Fever    Infection    Hair loss. Hair loss is often temporary, although with certain medicine, hair loss can sometimes be permanent. Hair loss may happen suddenly or gradually. If you lose hair, you may lose it from your head, face, armpits, pubic area, chest, and/or legs. You may also notice your hair getting thin.   Note: Each of the side effects above was reported in 30%  or greater of patients treated with irinotecan alone or in combination with other chemotherapy. Not all possible side effects are included above.   Warnings and Precautions    Severe diarrhea and colitis which is swelling (inflammation) in the colon - symptoms are diarrhea, stomach cramping, and sometimes blood in the bowel movements. Very rarely, an abnormal hole in your stomach, small and/or large intestine can happen. Diarrhea can begin shortly after the infusion or up to a week or two after, and can be life-threatening if it leads to dehydration, and other complications.     Severe bone marrow suppression which can be life-threatening    Allergic reactions, including anaphylaxis are rare but may happen in some patients. Signs of allergic reaction to this drug may be swelling of the face, feeling like your tongue or throat are swelling, trouble breathing, rash, itching, fever, chills, feeling dizzy, and/or feeling that your heart is beating in a fast or not normal way. If this happens, do not take another dose of this drug. You should get urgent medical treatment.    Changes in your kidney function which can cause kidney failure and be life-threatening    Inflammation (swelling) or scarring of the lungs, which can be life-threatening. You may have a cough or trouble breathing. Note: Some of the side effects above are very rare. If you have concerns and/or questions, please discuss them with your medical team. Important Information    This drug may be present in the saliva, tears, sweat, urine, stool, vomit, semen, and vaginal secretions. Talk to your doctor and/or your nurse about the necessary precautions to take during this time.  It is important that you notify your doctor and/or nurse at the first sign of diarrhea, so they can provide you with anti-diarrheal medication and give you further instructions. Notify your doctor and/ or nurse if you are taking anti-diarrheal medication and your  symptoms have not improved or are worsening.    Treating Side Effects    Manage tiredness by pacing your activities for the day.    Be sure to include periods of rest between energy-draining activities.    To decrease the risk of infection, wash your hands regularly.  Avoid close contact with people who have a cold, the flu, or other infections.    Take your temperature as your doctor or nurse tells you, and whenever you feel like you may have a fever.    To help decrease the risk of bleeding, use a soft toothbrush. Check with your nurse before using dental floss.    Be very careful when using knives or tools.    Use an electric shaver instead of a razor.    Drink plenty of fluids (a minimum of eight glasses per day is recommended).    If you throw up or have diarrhea, you should drink more fluids so that you do not become dehydrated (lack of water in the body from losing too much fluid).    Mouth care is very important. Your mouth care should consist of routine, gentle cleaning of your teeth or dentures and rinsing your mouth with a mixture of 1/2 teaspoon of salt in 8 ounces of water or 1/2 teaspoon of baking soda in 8 ounces of water. This should be done at least after each meal and at bedtime.    If you have mouth sores, avoid mouthwash that has alcohol. Also avoid alcohol and smoking because they can bother your mouth and throat.     To help with nausea and vomiting, eat small, frequent meals instead of three large meals a day. Choose foods and drinks that are at room temperature. Ask your nurse or doctor about other helpful tips and medicine that is available to help stop or lessen these symptoms.    If you have diarrhea, eat low-fiber foods that are high in protein and calories and avoid foods that can irritate your digestive tracts or lead to cramping.    If you are not able to move your bowels, check with your doctor or nurse before you use enemas, laxatives, or suppositories.     Ask your doctor or nurse about medicines that are available to help stop or lessen constipation and/ or diarrhea.    To help with decreased appetite, eat small, frequent meals. Eat foods high in calories and protein, such as meat, poultry, fish, dry beans, tofu, eggs, nuts, milk, yogurt, cheese, ice cream, pudding, and nutritional supplements.    Consider using sauces and spices to increase taste. Daily exercise, with your doctor's approval, may increase your appetite.    To help with weight loss, drink fluids that contribute calories (whole milk, juice, soft drinks, sweetened beverages, milkshakes, and nutritional supplements) instead of water.    Keeping your pain under control is important to your well-being. Please tell your doctor or nurse if you are experiencing pain.    To help with hair loss, wash with a mild shampoo and avoid washing your hair every day. Avoid coloring your hair.    Avoid rubbing your scalp, pat your hair or scalp dry.    Limit your use of hair spray, electric  curlers, blow dryers, and curling irons.    If you are interested in getting a wig, talk to your nurse and they can help you get in touch with programs in your local area.    Food and Drug Interactions    This drug may interact with grapefruit and grapefruit juice. Talk to your doctor as this could make side effects worse.    Check with your doctor or pharmacist about all other prescription medicines and over-the-counter medicines and dietary supplements (vitamins, minerals, herbs, and others) you are taking before starting this medicine as there are known drug interactions with irinotecan. Also, check with your doctor or pharmacist before starting any new prescription or over-the-counter medicines, or dietary supplements to make sure that there are no interactions.    Avoid the use of St. John's Wort while taking irinotecan as this may lower the levels of the drug in your body, which can make it less  effective.    When to Call the Doctor   Call your doctor or nurse if you have any of these symptoms and/or any new or unusual symptoms:    Fever of 100.4 F (38 C) or higher    Chills    Pain in your chest     Dry cough    Trouble breathing and/or wheezing    Feeling dizzy or lightheaded    Easy bleeding or bruising    Tiredness or weakness that interferes with your daily activities    Pain in your mouth or throat that makes it hard to eat or drink    Nausea that stops you from eating or drinking and/or is not relieved by prescribed medicines    Throwing up   Diarrhea, 4 times in one day or diarrhea with lack of strength or a feeling of being dizzy    No bowel movement in 3 days or when you feel uncomfortable    Severe abdominal pain that does not go away    Difficulty swallowing    General pain that does not go away, or is not relieved by prescribed medicines    Blood in your stool    Lasting loss of appetite or rapid weight loss of five pounds in a week    Decreased or very dark urine    Signs of allergic reaction: swelling of the face, feeling like your tongue or throat are swelling, trouble breathing, rash, itching, fever, chills, feeling dizzy, and/or feeling that your heart is beating in a fast or not normal way. If this happens, call 911 for emergency care.    Signs of possible liver problems: dark urine, pale bowel movements, pain in your abdomen, feeling very tired and weak, unusual itching, or yellowing of the eyes or skin    If you think you may be pregnant or may have impregnated your partner    Reproduction Warnings    Pregnancy warning: This drug can have harmful effects on the unborn baby. Women of childbearing potential should use effective methods of birth control during your cancer treatment and for 6 months after treatment. Men with male partners of childbearing potential should use effective methods of birth control during your cancer  treatment and for 3 months after your cancer treatment. Let your doctor know right away if you think you may be pregnant or may have impregnated your partner.    Breastfeeding warning: Women should not breastfeed during treatment and for at least 7 days after treatment because this drug can enter the breast  milk and cause harm to a breastfeeding baby.  Fertility warning: In men and women both, this drug may affect your ability to have children in the future. Talk with your doctor or nurse if you plan to have children. Ask for information on sperm or egg banking. Revised August 2021   Leucovorin Calcium  About This Drug  Leucovorin is a vitamin. It is used in combination with other cancer fighting drugs such as 5-fluorouracil and methotrexate. Leucovorin is given in the vein (IV).  This drug runs at the same time as the oxaliplatin and takes 2 hours to infuse.   Possible Side Effects  Rash and itching  Note: Leucovorin by itself has very few side effects. Other side effects you may have can be caused by the other drugs you are taking, such as 5-fluorouracil.    Warnings and Precautions   Allergic reactions, including anaphylaxis are rare but may happen in some patients. Signs of allergic reaction to this drug may be swelling of the face, feeling like your tongue or throat are swelling, trouble breathing, rash, itching, fever, chills, feeling dizzy, and/or feeling that your heart is beating in a fast or not normal way. If this happens, do not take another dose of this drug. You should get urgent medical treatment.   Food and Drug Interactions   There are no known interactions of leucovorin with food.   This drug may interact with other medicines. Tell your doctor and pharmacist about all the prescription and over-the-counter medicines and dietary supplements (vitamins, minerals, herbs and others) that you are taking at this time.   Also, check with your doctor or pharmacist before starting  any new prescription or over-the-counter medicines, or dietary supplements to make sure that there are no interactions.   When to Call the Doctor  Call your doctor or nurse if you have any of these symptoms and/or any new or unusual symptoms:   A new rash or a rash that is not relieved by prescribed medicines   Signs of allergic reaction: swelling of the face, feeling like your tongue or throat are swelling, trouble breathing, rash, itching, fever, chills, feeling dizzy, and/or feeling that your heart is beating in a fast or not normal way. If this happens, call 911 for emergency care.   If you think you may be pregnant   Reproduction Warnings   Pregnancy warning: It is not known if this drug may harm an unborn child. For this reason, be sure to talk with your doctor if you are pregnant or planning to become pregnant while receiving this drug. Let your doctor know right away if you think you may be pregnant   Breastfeeding warning: It is not known if this drug passes into breast milk. For this reason, women should talk to their doctor about the risks and benefits of breastfeeding during treatment with this drug because this drug may enter the breast milk and cause harm to a breastfeeding baby.   Fertility warning: Human fertility studies have not been done with this drug. Talk with your doctor or nurse if you plan to have children. Ask for information on sperm or egg banking.   5-Fluorouracil (Adrucil; 5FU)  About This Drug  Fluorouracil is used to treat cancer. It is given in the vein (IV). It is given as an IV push from a syringe and also as a continuous infusion given via an ambulatory pump (a pump you take home and wear for a specified amount of time).  Possible Side Effects   Bone marrow suppression. This is a decrease in the number of white blood cells, red blood cells, and platelets. This may raise your risk of infection, make you tired and weak (fatigue), and raise your risk  of bleeding   Changes in the tissue of the heart and/or heart attack. Some changes may happen that can cause your heart to have less ability to pump blood.   Blurred vision or other changes in eyesight   Nausea and throwing up (vomiting)   Diarrhea (loose bowel movements)   Ulcers - sores that may cause pain or bleeding in your digestive tract, which includes your mouth, esophagus, stomach, small/large intestines and rectum   Soreness of the mouth and throat. You may have red areas, white patches, or sores that hurt.   Allergic reactions, including anaphylaxis are rare but may happen in some patients. Signs of allergic reaction to this drug may be swelling of the face, feeling like your tongue or throat are swelling, trouble breathing, rash, itching, fever, chills, feeling dizzy, and/or feeling that your heart is beating in a fast or not normal way. If this happens, do not take another dose of this drug. You should get urgent medical treatment.   Sensitivity to light (photosensitivity). Photosensitivity means that you may become more sensitive to the sun and/or light. You may get a skin rash/reaction if you are in the sun or are exposed to sun lamps and tanning beds. Your eyes may water more, mostly in bright light.   Changes in your nail color, nail loss and/or brittle nail   Darkening of the skin, or changes to the color of your skin and/or veins used for infusion   Rash, dry skin, or itching  Note: Not all possible side effects are included above.  Warnings and Precautions   Hand-and-foot syndrome. The palms of your hands or soles of your feet may tingle, become numb, painful, swollen, or red.   Changes in your central nervous system can happen. The central nervous system is made up of your brain and spinal cord. You could feel extreme tiredness, agitation, confusion, hallucinations (see or hear things that are not there), trouble understanding or speaking, loss of control of your  bowels or bladder, eyesight changes, numbness or lack of strength to your arms, legs, face, or body, or coma. If you start to have any of these symptoms let your doctor know right away.   Side effects of this drug may be unexpectedly severe in some patients  Note: Some of the side effects above are very rare. If you have concerns and/or questions, please discuss them with your medical team.   Important Information   This drug may be present in the saliva, tears, sweat, urine, stool, vomit, semen, and vaginal secretions. Talk to your doctor and/or your nurse about the necessary precautions to take during this time.   Treating Side Effects   Manage tiredness by pacing your activities for the day.   Be sure to include periods of rest between energy-draining activities.   To help decrease the risk of infections, wash your hands regularly.   Avoid close contact with people who have a cold, the flu, or other infections.   Take your temperature as your doctor or nurse tells you, and whenever you feel like you may have a fever.   Use a soft toothbrush. Check with your nurse before using dental floss.   Be very careful when using knives or tools.  Use an electric shaver instead of a razor.   If you have a nose bleed, sit with your head tipped slightly forward. Apply pressure by lightly pinching the bridge of your nose between your thumb and forefinger. Call your doctor if you feel dizzy or faint or if the bleeding doesn't stop after 10 to 15 minutes.   Drink plenty of fluids (a minimum of eight glasses per day is recommended).   If you throw up or have loose bowel movements, you should drink more fluids so that you do not  become dehydrated (lack of water in the body from losing too much fluid).   To help with nausea and vomiting, eat small, frequent meals instead of three large meals a day. Choose foods and drinks that are at room temperature. Ask your nurse or doctor about other  helpful tips and medicine that is available to help, stop, or lessen these symptoms.   If you have diarrhea, eat low-fiber foods that are high in protein and calories and avoid foods that can irritate your digestive tracts or lead to cramping.   Ask your nurse or doctor about medicine that can lessen or stop your diarrhea.   Mouth care is very important. Your mouth care should consist of routine, gentle cleaning of your teeth or dentures and rinsing your mouth with a mixture of 1/2 teaspoon of salt in 8 ounces of water or 1/2 teaspoon of baking soda in 8 ounces of water. This should be done at least after each meal and at bedtime.   If you have mouth sores, avoid mouthwash that has alcohol. Also avoid alcohol and smoking because they can bother your mouth and throat.   Keeping your nails moisturized may help with brittleness.   To help with itching, moisturize your skin several times day.   Use sunscreen with SPF 30 or higher when you are outdoors even for a short time. Cover up when you are out in the sun. Wear wide-brimmed hats, long-sleeved shirts, and pants. Keep your neck, chest, and back covered. Wear dark sun glasses when in the sun or bright lights.   If you get a rash do not put anything on it unless your doctor or nurse says you may. Keep the area around the rash clean and dry. Ask your doctor for medicine if your rash bothers you.   Keeping your pain under control is important to your well-being. Please tell your doctor or nurse if you are experiencing pain.   Food and Drug Interactions   There are no known interactions of fluorouracil with food.   Check with your doctor or pharmacist about all other prescription medicines and over-the-counter medicines and dietary supplements (vitamins, minerals, herbs and others) you are taking before starting this medicine as there are known drug interactions with 5-fluoroucacil. Also, check with your doctor or pharmacist before starting any  new prescription or over-the-counter medicines, or dietary supplements to make sure that there are no interactions.   When to Call the Doctor  Call your doctor or nurse if you have any of these symptoms and/or any new or unusual symptoms:   Fever of 100.4 F (38 C) or higher   Chills   Easy bleeding or bruising   Nose bleed that doesn't stop bleeding after 10-15 minutes   Trouble breathing   Feeling dizzy or lightheaded   Feeling that your heart is beating in a fast or not normal way (palpitations)   Chest pain or symptoms of a heart  attack. Most heart attacks involve pain in the center of the chest that lasts more than a few minutes. The pain may go away and come back or it can be constant. It can feel like pressure, squeezing, fullness, or pain. Sometimes pain is felt in one or both arms, the back, neck, jaw, or stomach. If any of these symptoms last 2 minutes, call 911.   Confusion and/or agitation   Hallucinations   Trouble understanding or speaking   Loss of control of bowels or bladder   Blurry vision or changes in your eyesight   Headache that does not go away   Numbness or lack of strength to your arms, legs, face, or body   Nausea that stops you from eating or drinking and/or is not relieved by prescribed medicines   Throwing up    Diarrhea, 4 times in one day or diarrhea with lack of strength or a feeling of being dizzy   Pain in your mouth or throat that makes it hard to eat or drink   Pain along the digestive tract - especially if worse after eating   Blood in your vomit (bright red or coffee-ground) and/or stools (bright red, or black/tarry)   Coughing up blood   Tiredness that interferes with your daily activities   Painful, red, or swollen areas on your hands or feet or around your nails   A new rash or a rash that is not relieved by prescribed medicines   Develop sensitivity to sunlight/light   Numbness and/or tingling of your hands and/or  feet   Signs of allergic reaction: swelling of the face, feeling like your tongue or throat are swelling, trouble breathing, rash, itching, fever, chills, feeling dizzy, and/or feeling that your heart is beating in a fast or not normal way. If this happens, call 911 for emergency care.   If you think you are pregnant or may have impregnated your partner  Reproduction Warnings   Pregnancy warning: This drug may have harmful effects on the unborn baby. Women of child bearing potential should use effective methods of birth control during your cancer treatment and 3 months after treatment. Men with male partners of childbearing potential should use effective methods of birth control during your cancer treatment and for 3 months after your cancer treatment. Let your doctor know right away if you think you may be pregnant or may have impregnated your partner.   Breastfeeding warning: It is not known if this drug passes into breast milk. For this reason, Women should not breastfeed during treatment because this drug could enter the breast milk and cause harm to a breastfeeding baby.   Fertility warning: In men and women both, this drug may affect your ability to have children in the future. Talk with your doctor or nurse if you plan to have children. Ask for information on sperm or egg banking.   SELF CARE ACTIVITIES WHILE RECEIVING CHEMOTHERAPY:  Hydration Increase your fluid intake 48 hours prior to treatment and drink at least 8 to 12 cups (64 ounces) of water/decaffeinated beverages per day after treatment. You can still have your cup of coffee or soda but these beverages do not count as part of your 8 to 12 cups that you need to drink daily. No alcohol intake.  Medications Continue taking your normal prescription medication as prescribed.  If you start any new herbal or new supplements please let us know first to make sure it is safe.  Mouth Care Have teeth cleaned  professionally before  starting treatment. Keep dentures and partial plates clean. Use soft toothbrush and do not use mouthwashes that contain alcohol. Biotene is a good mouthwash that is available at most pharmacies or may be ordered by calling [phone]. Use warm salt water gargles (1 teaspoon salt per 1 quart warm water) before and after meals and at bedtime. If you need dental work, please let the doctor know before you go for your appointment so that we can coordinate the best possible time for you in regards to your chemo regimen. You need to also let your dentist know that you are actively taking chemo. We may need to do labs prior to your dental appointment.  Skin Care Always use sunscreen that has not expired and with SPF (Sun Protection Factor) of 50 or higher. Wear hats to protect your head from the sun. Remember to use sunscreen on your hands, ears, face, & feet.  Use good moisturizing lotions such as udder cream, eucerin, or even Vaseline. Some chemotherapies can cause dry skin, color changes in your skin and nails.    Avoid long, hot showers or baths. Use gentle, fragrance-free soaps and laundry detergent. Use moisturizers, preferably creams or ointments rather than lotions because the thicker consistency is better at preventing skin dehydration. Apply the cream or ointment within 15 minutes of showering. Reapply moisturizer at night, and moisturize your hands every time after you wash them.  Hair Loss (if your doctor says your hair will fall out)  If your doctor says that your hair is likely to fall out, decide before you begin chemo whether you want to wear a wig. You may want to shop before treatment to match your hair color. Hats, turbans, and scarves can also camouflage hair loss, although some people prefer to leave their heads uncovered. If you go bare-headed outdoors, be sure to use sunscreen on your scalp. Cut your hair short. It eases the inconvenience of shedding lots of hair, but it also can  reduce the emotional impact of watching your hair fall out. Don't perm or color your hair during chemotherapy. Those chemical treatments are already damaging to hair and can enhance hair loss. Once your chemo treatments are done and your hair has grown back, it's OK to resume dyeing or perming hair.  With chemotherapy, hair loss is almost always temporary. But when it grows back, it may be a different color or texture. In older adults who still had hair color before chemotherapy, the new growth may be completely gray.  Often, new hair is very fine and soft.  Infection Prevention Please wash your hands for at least 30 seconds using warm soapy water. Handwashing is the #1 way to prevent the spread of germs. Stay away from sick people or people who are getting over a cold. If you develop respiratory systems such as green/yellow mucus production or productive cough or persistent cough let us know and we will see if you need an antibiotic. It is a good idea to keep a pair of gloves on when going into grocery stores/[employer] to decrease your risk of coming into contact with germs on the carts, etc. Carry alcohol hand gel with you at all times and use it frequently if out in public. If your temperature reaches 100.5 or higher please call the clinic and let us know.  If it is after hours or on the weekend please go to the ER if your temperature is over 100.5.  Please have your own personal thermometer  at home to use.    Sex and bodily fluids If you are going to have sex, a condom must be used to protect the person that isn't taking chemotherapy. Chemo can decrease your libido (sex drive). For a few days after chemotherapy, chemotherapy can be excreted through your bodily fluids.  When using the toilet please close the lid and flush the toilet twice.  Do this for a few day after you have had chemotherapy.   Effects of chemotherapy on your sex life Some changes are simple and won't last long. They won't affect your  sex life permanently.  Sometimes you may feel: too tired not strong enough to be very active sick or sore  not in the mood anxious or low Your anxiety might not seem related to sex. For example, you may be worried about the cancer and how your treatment is going. Or you may be worried about money, or about how you family are coping with your illness.  These things can cause stress, which can affect your interest in sex. It's important to talk to your partner about how you feel.  Remember - the changes to your sex life don't usually last long. There's usually no medical reason to stop having sex during chemo. The drugs won't have any long term physical effects on your performance or enjoyment of sex. Cancer can't be passed on to your partner during sex  Contraception It's important to use reliable contraception during treatment. Avoid getting pregnant while you or your partner are having chemotherapy. This is because the drugs may harm the baby. Sometimes chemotherapy drugs can leave a man or woman infertile.  This means you would not be able to have children in the future. You might want to talk to someone about permanent infertility. It can be very difficult to learn that you may no longer be able to have children. Some people find counselling helpful. There might be ways to preserve your fertility, although this is easier for men than for women. You may want to speak to a fertility expert. You can talk about sperm banking or harvesting your eggs. You can also ask about other fertility options, such as donor eggs. If you have or have had breast cancer, your doctor might advise you not to take the contraceptive pill. This is because the hormones in it might affect the cancer. It is not known for sure whether or not chemotherapy drugs can be passed on through semen or secretions from the vagina. Because of this some doctors advise people to use a barrier method if you have sex during treatment. This  applies to vaginal, anal or oral sex. Generally, doctors advise a barrier method only for the time you are actually having the treatment and for about a week after your treatment. Advice like this can be worrying, but this does not mean that you have to avoid being intimate with your partner. You can still have close contact with your partner and continue to enjoy sex.  Animals If you have cats or birds we just ask that you not change the litter or change the cage.  Please have someone else do this for you while you are on chemotherapy.   Food Safety During and After Cancer Treatment Food safety is important for people both during and after cancer treatment. Cancer and cancer treatments, such as chemotherapy, radiation therapy, and stem cell/bone marrow transplantation, often weaken the immune system. This makes it harder for your body to protect itself from  foodborne illness, also called food poisoning. Foodborne illness is caused by eating food that contains harmful bacteria, parasites, or viruses.  Foods to avoid Some foods have a higher risk of becoming tainted with bacteria. These include: Unwashed fresh fruit and vegetables, especially leafy vegetables that can hide dirt and other contaminants Raw sprouts, such as alfalfa sprouts Raw or undercooked beef, especially ground beef, or other raw or undercooked meat and poultry Fatty, fried, or spicy foods immediately before or after treatment.  These can sit heavy on your stomach and make you feel nauseous. Raw or undercooked shellfish, such as oysters. Sushi and sashimi, which often contain raw fish.  Unpasteurized beverages, such as unpasteurized fruit juices, raw milk, raw yogurt, or cider Undercooked eggs, such as soft boiled, over easy, and poached; raw, unpasteurized eggs; or foods made with raw egg, such as homemade raw cookie dough and homemade mayonnaise  Simple steps for food safety  Shop smart. Do not buy food stored or displayed in  an unclean area. Do not buy bruised or damaged fruits or vegetables. Do not buy cans that have cracks, dents, or bulges. Pick up foods that can spoil at the end of your shopping trip and store them in a cooler on the way home.  Prepare and clean up foods carefully. Rinse all fresh fruits and vegetables under running water, and dry them with a clean towel or paper towel. Clean the top of cans before opening them. After preparing food, wash your hands for 20 seconds with hot water and soap. Pay special attention to areas between fingers and under nails. Clean your utensils and dishes with hot water and soap. Disinfect your kitchen and cutting boards using 1 teaspoon of liquid, unscented bleach mixed into 1 quart of water.    Dispose of old food. Eat canned and packaged food before its expiration date (the "use by" or "best before" date). Consume refrigerated leftovers within 3 to 4 days. After that time, throw out the food. Even if the food does not smell or look spoiled, it still may be unsafe. Some bacteria, such as Listeria, can grow even on foods stored in the refrigerator if they are kept for too long.  Take precautions when eating out. At restaurants, avoid buffets and salad bars where food sits out for a long time and comes in contact with many people. Food can become contaminated when someone with a virus, often a norovirus, or another "bug" handles it. Put any leftover food in a "to-go" container yourself, rather than having the server do it. And, refrigerate leftovers as soon as you get home. Choose restaurants that are clean and that are willing to prepare your food as you order it cooked.   AT HOME MEDICATIONS:  Compazine/Prochlorperazine 10mg  tablet. Take 1 tablet every 6 hours as needed for nausea/vomiting. (This can make  you sleepy)   EMLA cream. Apply a quarter size amount to port site 1 hour prior to chemo. Do not rub in. Cover with plastic wrap.    Diarrhea Sheet   If you are having loose stools/diarrhea, please purchase Imodium and begin taking as outlined:  At the first sign of poorly formed or loose stools you should begin taking Imodium (loperamide) 2 mg capsules.  Take two tablets (4mg ) followed by one tablet (2mg ) every 2 hours - DO NOT EXCEED 8 tablets in 24 hours.  If it is bedtime and you are having loose stools, take 2 tablets at bedtime, then 2 tablets every 4 hours until morning.   Always call the Cancer Center if you are having loose stools/diarrhea that you can't get under control.  Loose stools/diarrhea leads to dehydration (loss of water) in your body.  We have other options of trying to get the loose stools/diarrhea to stop but you must let us know!   Constipation Sheet  Colace - 100 mg capsules - take 2 capsules daily.  If this doesn't help then you can increase to 2 capsules twice daily.  Please call if the above does not work for you. Do not go more than 2 days without a bowel movement.  It is very important that you do not become constipated.  It will make you feel sick to your stomach (nausea) and can cause abdominal pain and vomiting.  Nausea Sheet   Compazine/Prochlorperazine 10mg  tablet. Take 1 tablet every 6 hours as needed for nausea/vomiting (This can make you drowsy).  If you are having persistent nausea (nausea that does not stop) please call the Cancer Center and let us know the amount of nausea that you are experiencing.  If you begin to vomit, you need to call the Cancer Center and if it is the weekend and you have vomited more than one time and can't get it to stop-go to the Emergency Room.  Persistent nausea/vomiting can lead to dehydration (loss of fluid in your body) and will make you feel very weak and unwell. Ice chips, sips of clear liquids, foods that are at room  temperature, crackers, and toast tend to be better tolerated.   SYMPTOMS TO REPORT AS SOON AS POSSIBLE AFTER TREATMENT:  FEVER GREATER THAN 100.5 F  CHILLS WITH OR WITHOUT FEVER  NAUSEA AND VOMITING THAT IS NOT CONTROLLED WITH YOUR NAUSEA MEDICATION  UNUSUAL SHORTNESS OF BREATH  UNUSUAL BRUISING OR BLEEDING  TENDERNESS IN MOUTH AND THROAT WITH OR WITHOUT   PRESENCE OF ULCERS  URINARY PROBLEMS  BOWEL PROBLEMS  UNUSUAL RASH      Wear comfortable clothing and clothing appropriate for easy access to any Portacath or PICC line. Let us know if there is anything that we can do to make your therapy better!    What to do if you need assistance after hours or on the weekends: CALL [phone].  HOLD on the line, do not hang up.  You will hear multiple messages but at the end you will be connected with a nurse triage line.  They will contact the doctor if necessary.  Most of the time they will be able to assist you.  Do not call the hospital operator.      I have been informed and understand all of the instructions given to me and have received a copy. I have been instructed to call  the clinic [phone] or my family physician as soon as possible for continued medical care, if indicated. I do not have any more questions at this time but understand that I may call the [facility] or the Patient Navigator at [phone] during office hours should I have questions or need assistance in obtaining follow-up care.

## 2022-10-01 ENCOUNTER — Other Ambulatory Visit: Payer: BC Managed Care – PPO | Admitting: Licensed Clinical Social Worker

## 2022-10-01 ENCOUNTER — Other Ambulatory Visit: Payer: Self-pay | Admitting: Radiology

## 2022-10-01 ENCOUNTER — Inpatient Hospital Stay: Payer: No Typology Code available for payment source

## 2022-10-01 ENCOUNTER — Inpatient Hospital Stay: Payer: No Typology Code available for payment source | Admitting: Licensed Clinical Social Worker

## 2022-10-01 DIAGNOSIS — C259 Malignant neoplasm of pancreas, unspecified: Secondary | ICD-10-CM

## 2022-10-01 NOTE — Progress Notes (Signed)
[facility] Clinical Social Work  Initial Assessment   Craig Cole is a 62 y.o. year old male accompanied by patient and friend Lubertha South) . Clinical Social Work was referred by medical provider for assessment of psychosocial needs.   SDOH (Social Determinants of Health) assessments performed: Yes   SDOH Screenings   Food Insecurity: No Food Insecurity (09/24/2022)  Housing: Low Risk  (09/24/2022)  Transportation Needs: No Transportation Needs (09/24/2022)  Utilities: Not At Risk (09/24/2022)  Depression (PHQ2-9): Low Risk  (09/24/2022)  Tobacco Use: Medium Risk (09/15/2022)     Distress Screen completed: No     No data to display            Family/Social Information:  Housing Arrangement: patient lives with his sister Darl Pikes and friend Lubertha South.  Pt states he was independent in ADLs; however, recently his health has significantly declined and he is requiring a great deal of assistance. Family members/support persons in your life? Pt's friend Alecia Lemming moved in approximately 1 year ago and assists pt w/ getting to appointments as well as support he needs at home.  Pt's sister works as a Water engineer 3 evenings a week (occasionally overnight), and is also available to provide support and assistance.  Pt states he has a number of relatives who reside locally and believes he has good support. Transportation concerns: no  Employment: Working full time until the middle of March.  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  Pt is unsure of the benefits he may be eligible for through work.  CSW encouraged pt to reach out to HR as he may be entitled to short term disability which would provide some income.    Income source: Supported by [employer] and [employer] Financial concerns: Yes, due to illness and/or loss of work during treatment Type of concern: Utilities, Government social research officer, and Medical  bills Food access concerns: no Religious or spiritual practice: Yes-[employer] [facility] Currently in place:  none  Coping/ Adjustment to diagnosis: Patient understands treatment plan and what happens next? yes Concerns about diagnosis and/or treatment: Losing my job and/or losing income, Overwhelmed by information, How I will pay for the services I need, and Quality of life Patient reported stressors: Finances, Adjusting to my illness, Facing my mortality, and Physical issues Hopes and/or priorities: Pt's priority is to start into treatment w/ the hopes of positive results. Patient enjoys music Current coping skills/ strengths: Motivation for treatment/growth  and Supportive family/friends     SUMMARY: Current SDOH Barriers:  Financial constraints related to loss of income and Inability to perform ADL's independently  Clinical Social Work Clinical Goal(s):  Explore community resource options for unmet needs related to:  Financial Strain   Interventions: Discussed common feeling and emotions when being diagnosed with cancer, and the importance of support during treatment Informed patient of the support team roles and support services at [facility] Provided CSW contact information and encouraged patient to call with any questions or concerns Referred patient to Marijean Niemann and provided information to apply for the [employer].  Recommended pt reach out to HR at work to inquire what benefits he may be eligible for.  Discussed the process to apply for Medicaid if needed.   Follow Up Plan: Patient will contact CSW with any support or resource needs Patient verbalizes understanding of plan: Yes    Rachel Moulds, LCSW

## 2022-10-01 NOTE — Progress Notes (Signed)

## 2022-10-01 NOTE — Patient Instructions (Addendum)
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 You will be treated in the clinic every 2 weeks with a combination of chemotherapy drugs. Those drugs are oxaliplatin, irinotecan, and fluorouracil (5FU). There is another drug called leucovorin you will also receive in the clinic. This is not a chemotherapy drug, but a vitamin that helps the 5FU work better. The intent of treatment is to shrink and control the cancer, prevent it from spreading further, and to alleviate any symptoms you may be having related to this disease. You will see the doctor regularly throughout treatment.  We will obtain blood work from you prior to every treatment and monitor your results to make sure it is safe to give your treatment. The doctor monitors your response to treatment by the way you are feeling, your blood work, and by obtaining scans periodically. There will be wait times while you are here for treatment. It will take about 30 minutes to 1 hour for your lab work to result. Then there will be wait times while pharmacy mixes your medications.   Medications you will receive in the clinic prior to your chemotherapy medications:  Aloxi:  ALOXI is used in adults to help prevent nausea and vomiting that happens with certain chemotherapy drugs.  Aloxi is a long acting medication, and will remain in your system for about two days.   Emend:  This is an anti-nausea medication that is used with Aloxi to help prevent nausea and vomiting caused by chemotherapy.  Dexamethasone:  This is a steroid given prior to chemotherapy to help prevent allergic reactions; it may also help prevent and control nausea and diarrhea.     Oxaliplatin (Eloxatin)  About This Drug  Oxaliplatin is used to treat cancer. It is given in the vein (IV).  It takes two hours to infuse.  Possible Side Effects   Bone marrow suppression. This is a decrease in the number of  white blood cells, red blood cells, and platelets. This may raise your risk of infection, make you tired and weak (fatigue), and raise your risk of bleeding.   Tiredness   Soreness of the mouth and throat. You may have red areas, white patches, or sores that hurt.   Nausea and vomiting (throwing up)   Diarrhea (loose bowel movements)   Changes in your liver function   Effects on the nerves called peripheral neuropathy. You may feel numbness, tingling, or pain in your hands and feet, and may be worse in cold temperatures. It may be hard for you to button your clothes, open jars, or walk as usual. The effect on the nerves may get worse with more doses of the drug. These effects get better in some people after the drug is stopped but it does not get better in all people  Note: Each of the side effects above was reported in 40% or greater of patients treated with oxaliplatin. Not all possible side effects are included above.   Warnings and Precautions   Allergic reactions, including anaphylaxis, which may be life-threatening are rare but may happen in some patients. Signs of allergic reaction to this drug may be swelling of the face, feeling like your tongue or throat are swelling, trouble breathing, rash, itching, fever, chills, feeling dizzy, and/or feeling that your heart is beating in a fast or not normal way. If this happens, do not take another dose of this drug. You should get urgent medical treatment.  Inflammation (swelling) of the lungs, which may be life-threatening. You may have a dry cough or trouble breathing.   Effects on the nerves (neuropathy) may resolve within 14 days, or it may persist beyond 14 days.   Severe decrease in white blood cells when combined with the chemotherapy agents 5-fluorouracil and leucovorin. This may be life-threatening.   Severe changes in your liver function   Abnormal heart beat and/or EKG, which can be life-threatening   Rhabdomyolysis-  damage to your muscles which may release proteins in your blood and affect how your kidneys work, which can be life-threatening. You may have severe muscle weakness and/or pain, or dark urine.  Important Information   This drug may impair your ability to drive or use machinery. Talk to your doctor and/or nurse about precautions you may need to take.   This drug may be present in the saliva, tears, sweat, urine, stool, vomit, semen, and vaginal secretions. Talk to your doctor and/or your nurse about the necessary precautions to take during this time.  * The effects on the nerves can be aggravated by exposure to cold. Avoid cold beverages, use of ice and make sure you cover your skin and dress warmly prior to being exposed to cold temperatures while you are receiving treatment with oxaliplatin*   Treating Side Effects   Manage tiredness by pacing your activities for the day.   Be sure to include periods of rest between energy-draining activities.   To decrease the risk of infection, wash your hands regularly.   Avoid close contact with people who have a cold, the flu, or other infections.  Take your temperature as your doctor or nurse tells you, and whenever you feel like you may have a fever.   To help decrease the risk of bleeding, use a soft toothbrush. Check with your nurse before using dental floss.   Be very careful when using knives or tools.   Use an electric shaver instead of a razor.   Drink plenty of fluids (a minimum of eight glasses per day is recommended).   Mouth care is very important. Your mouth care should consist of routine, gentle cleaning of your teeth or dentures and rinsing your mouth with a mixture of 1/2 teaspoon of salt in 8 ounces of water or 1/2 teaspoon of baking soda in 8 ounces of water. This should be done at least after each meal and at bedtime.   If you have mouth sores, avoid mouthwash that has alcohol. Also avoid alcohol and smoking because they can  bother your mouth and throat.   To help with nausea and vomiting, eat small, frequent meals instead of three large meals a day. Choose foods and drinks that are at room temperature. Ask your nurse or doctor about other helpful tips and medicine that is available to help stop or lessen these symptoms.   If you throw up or have loose bowel movements, you should drink more fluids so that you do not become dehydrated (lack of water in the body from losing too much fluid).   If you have diarrhea, eat low-fiber foods that are high in protein and calories and avoid foods that can irritate your digestive tracts or lead to cramping.   Ask your nurse or doctor about medicine that can lessen or stop your diarrhea.   If you have numbness and tingling in your hands and feet, be careful when cooking, walking, and handling sharp objects and hot liquids.   Do not drink cold  drinks or use ice in beverages. Drink fluids at room temperature or warmer, and drink through a straw.   Wear gloves to touch cold objects, and wear warm clothing and cover you skin during cold weather.   Food and Drug Interactions   There are no known interactions of oxaliplatin with food and other medications.   This drug may interact with other medicines. Tell your doctor and pharmacist about all the prescription and over-the-counter medicines and dietary supplements (vitamins, minerals, herbs and others) that you are taking at this time. Also, check with your doctor or pharmacist before starting any new prescription or over-the-counter medicines, or dietary supplements to make sure that there are no interactions   When to Call the Doctor  Call your doctor or nurse if you have any of these symptoms and/or any new or unusual symptoms:   Fever of 100.4 F (38 C) or higher   Chills   Tiredness that interferes with your daily activities   Feeling dizzy or lightheaded   Easy bleeding or bruising   Feeling that your heart is  beating in a fast or not normal way (palpitations)   Pain in your chest   Dry cough   Trouble breathing   Pain in your mouth or throat that makes it hard to eat or drink   Nausea that stops you from eating or drinking and/or is not relieved by prescribed medicines   Throwing up   Diarrhea, 4 times in one day or diarrhea with lack of strength or a feeling of being dizzy   Numbness, tingling, or pain in your hands and feet   Signs of possible liver problems: dark urine, pale bowel movements, bad stomach pain, feeling very tired and weak, unusual itching, or yellowing of the eyes or skin   Signs of rhabdomyolysis: decreased urine, very dark urine, muscle pain in the shoulders, thighs, or lower back; muscle weakness or trouble moving arms and legs   Signs of allergic reaction: swelling of the face, feeling like your tongue or throat are swelling, trouble breathing, rash, itching, fever, chills, feeling dizzy, and/or feeling that your heart is beating in a fast or not normal way. If this happens, call 911 for emergency care.   If you think you may be pregnant  Reproduction Warnings   Pregnancy warning: This drug may have harmful effects on the unborn baby. Women of childbearing potential should use effective methods of birth control during your cancer treatment. Let your doctor know right away if you think you may be pregnant or may have impregnated your partner.   Breastfeeding warning: It is not known if this drug passes into breast milk. For this reason, women should talk to their doctor about the risks and benefits of breastfeeding during treatment with this drug because this drug may enter the breast milk and cause harm to a breastfeeding baby.   Fertility warning: Human fertility studies have not been done with this drug. Talk with your doctor or nurse if you plan to have children. Ask for information on sperm or egg banking.   Irinotecan (Camptosar)    About This Drug   Irinotecan is used to treat cancer. It is given in the vein (IV). It will take 1.5 hours to infuse.    Possible Side Effects   Bone marrow suppression. This is a decrease in the number of white blood cells, red blood cells, and platelets. This may raise your risk of infection, make you tired and weak, and raise  your risk of bleeding.    Soreness of the mouth and throat. You may have red areas, white patches, or sores that hurt.    Nausea and vomiting (throwing up)    Pain in your abdomen  Diarrhea (loose bowel movements)    Constipation (unable to move bowels)    Decreased appetite (decreased hunger)    Weight loss    Changes in your liver function    Pain    Weakness    Fever    Infection    Hair loss. Hair loss is often temporary, although with certain medicine, hair loss can sometimes be permanent. Hair loss may happen suddenly or gradually. If you lose hair, you may lose it from your head, face, armpits, pubic area, chest, and/or legs. You may also notice your hair getting thin.   Note: Each of the side effects above was reported in 30% or greater of patients treated with irinotecan alone or in combination with other chemotherapy. Not all possible side effects are included above.   Warnings and Precautions    Severe diarrhea and colitis which is swelling (inflammation) in the colon - symptoms are diarrhea, stomach cramping, and sometimes blood in the bowel movements. Very rarely, an abnormal hole in your stomach, small and/or large intestine can happen. Diarrhea can begin shortly after the infusion or up to a week or two after, and can be life-threatening if it leads to dehydration, and other complications.     Severe bone marrow suppression which can be life-threatening    Allergic reactions, including anaphylaxis are rare but may happen in some patients. Signs of allergic reaction to this drug may be swelling of the face, feeling like your tongue or throat are swelling,  trouble breathing, rash, itching, fever, chills, feeling dizzy, and/or feeling that your heart is beating in a fast or not normal way. If this happens, do not take another dose of this drug. You should get urgent medical treatment.    Changes in your kidney function which can cause kidney failure and be life-threatening    Inflammation (swelling) or scarring of the lungs, which can be life-threatening. You may have a cough or trouble breathing. Note: Some of the side effects above are very rare. If you have concerns and/or questions, please discuss them with your medical team. Important Information    This drug may be present in the saliva, tears, sweat, urine, stool, vomit, semen, and vaginal secretions. Talk to your doctor and/or your nurse about the necessary precautions to take during this time.    It is important that you notify your doctor and/or nurse at the first sign of diarrhea, so they can provide you with anti-diarrheal medication and give you further instructions. Notify your doctor and/ or nurse if you are taking anti-diarrheal medication and your symptoms have not improved or are worsening.    Treating Side Effects    Manage tiredness by pacing your activities for the day.    Be sure to include periods of rest between energy-draining activities.    To decrease the risk of infection, wash your hands regularly.  Avoid close contact with people who have a cold, the flu, or other infections.    Take your temperature as your doctor or nurse tells you, and whenever you feel like you may have a fever.    To help decrease the risk of bleeding, use a soft toothbrush. Check with your nurse before using dental floss.    Be very careful when  using knives or tools.    Use an electric shaver instead of a razor.    Drink plenty of fluids (a minimum of eight glasses per day is recommended).    If you throw up or have diarrhea, you should drink more fluids so that you do not become  dehydrated (lack of water in the body from losing too much fluid).    Mouth care is very important. Your mouth care should consist of routine, gentle cleaning of your teeth or dentures and rinsing your mouth with a mixture of 1/2 teaspoon of salt in 8 ounces of water or 1/2 teaspoon of baking soda in 8 ounces of water. This should be done at least after each meal and at bedtime.    If you have mouth sores, avoid mouthwash that has alcohol. Also avoid alcohol and smoking because they can bother your mouth and throat.     To help with nausea and vomiting, eat small, frequent meals instead of three large meals a day. Choose foods and drinks that are at room temperature. Ask your nurse or doctor about other helpful tips and medicine that is available to help stop or lessen these symptoms.    If you have diarrhea, eat low-fiber foods that are high in protein and calories and avoid foods that can irritate your digestive tracts or lead to cramping.    If you are not able to move your bowels, check with your doctor or nurse before you use enemas, laxatives, or suppositories.    Ask your doctor or nurse about medicines that are available to help stop or lessen constipation and/ or diarrhea.    To help with decreased appetite, eat small, frequent meals. Eat foods high in calories and protein, such as meat, poultry, fish, dry beans, tofu, eggs, nuts, milk, yogurt, cheese, ice cream, pudding, and nutritional supplements.    Consider using sauces and spices to increase taste. Daily exercise, with your doctor's approval, may increase your appetite.    To help with weight loss, drink fluids that contribute calories (whole milk, juice, soft drinks, sweetened beverages, milkshakes, and nutritional supplements) instead of water.    Keeping your pain under control is important to your well-being. Please tell your doctor or nurse if you are experiencing pain.    To help with hair loss, wash with a mild shampoo and  avoid washing your hair every day. Avoid coloring your hair.    Avoid rubbing your scalp, pat your hair or scalp dry.    Limit your use of hair spray, electric curlers, blow dryers, and curling irons.    If you are interested in getting a wig, talk to your nurse and they can help you get in touch with programs in your local area.    Food and Drug Interactions    This drug may interact with grapefruit and grapefruit juice. Talk to your doctor as this could make side effects worse.    Check with your doctor or pharmacist about all other prescription medicines and over-the-counter medicines and dietary supplements (vitamins, minerals, herbs, and others) you are taking before starting this medicine as there are known drug interactions with irinotecan. Also, check with your doctor or pharmacist before starting any new prescription or over-the-counter medicines, or dietary supplements to make sure that there are no interactions.    Avoid the use of St. John's Wort while taking irinotecan as this may lower the levels of the drug in your body, which can make it less  effective.    When to Call the Doctor   Call your doctor or nurse if you have any of these symptoms and/or any new or unusual symptoms:    Fever of 100.4 F (38 C) or higher    Chills    Pain in your chest     Dry cough    Trouble breathing and/or wheezing    Feeling dizzy or lightheaded    Easy bleeding or bruising    Tiredness or weakness that interferes with your daily activities    Pain in your mouth or throat that makes it hard to eat or drink    Nausea that stops you from eating or drinking and/or is not relieved by prescribed medicines    Throwing up   Diarrhea, 4 times in one day or diarrhea with lack of strength or a feeling of being dizzy    No bowel movement in 3 days or when you feel uncomfortable    Severe abdominal pain that does not go away    Difficulty swallowing    General pain that does not  go away, or is not relieved by prescribed medicines    Blood in your stool    Lasting loss of appetite or rapid weight loss of five pounds in a week    Decreased or very dark urine    Signs of allergic reaction: swelling of the face, feeling like your tongue or throat are swelling, trouble breathing, rash, itching, fever, chills, feeling dizzy, and/or feeling that your heart is beating in a fast or not normal way. If this happens, call 911 for emergency care.    Signs of possible liver problems: dark urine, pale bowel movements, pain in your abdomen, feeling very tired and weak, unusual itching, or yellowing of the eyes or skin    If you think you may be pregnant or may have impregnated your partner    Reproduction Warnings    Pregnancy warning: This drug can have harmful effects on the unborn baby. Women of childbearing potential should use effective methods of birth control during your cancer treatment and for 6 months after treatment. Men with male partners of childbearing potential should use effective methods of birth control during your cancer treatment and for 3 months after your cancer treatment. Let your doctor know right away if you think you may be pregnant or may have impregnated your partner.    Breastfeeding warning: Women should not breastfeed during treatment and for at least 7 days after treatment because this drug can enter the breast milk and cause harm to a breastfeeding baby.  Fertility warning: In men and women both, this drug may affect your ability to have children in the future. Talk with your doctor or nurse if you plan to have children. Ask for information on sperm or egg banking. Revised August 2021   Leucovorin Calcium  About This Drug  Leucovorin is a vitamin. It is used in combination with other cancer fighting drugs such as 5-fluorouracil and methotrexate. Leucovorin is given in the vein (IV).  This drug runs at the same time as the oxaliplatin and takes 2  hours to infuse.   Possible Side Effects  Rash and itching  Note: Leucovorin by itself has very few side effects. Other side effects you may have can be caused by the other drugs you are taking, such as 5-fluorouracil.    Warnings and Precautions   Allergic reactions, including anaphylaxis are rare but may happen in some patients.  Signs of allergic reaction to this drug may be swelling of the face, feeling like your tongue or throat are swelling, trouble breathing, rash, itching, fever, chills, feeling dizzy, and/or feeling that your heart is beating in a fast or not normal way. If this happens, do not take another dose of this drug. You should get urgent medical treatment.   Food and Drug Interactions   There are no known interactions of leucovorin with food.   This drug may interact with other medicines. Tell your doctor and pharmacist about all the prescription and over-the-counter medicines and dietary supplements (vitamins, minerals, herbs and others) that you are taking at this time.   Also, check with your doctor or pharmacist before starting any new prescription or over-the-counter medicines, or dietary supplements to make sure that there are no interactions.   When to Call the Doctor  Call your doctor or nurse if you have any of these symptoms and/or any new or unusual symptoms:   A new rash or a rash that is not relieved by prescribed medicines   Signs of allergic reaction: swelling of the face, feeling like your tongue or throat are swelling, trouble breathing, rash, itching, fever, chills, feeling dizzy, and/or feeling that your heart is beating in a fast or not normal way. If this happens, call 911 for emergency care.   If you think you may be pregnant   Reproduction Warnings   Pregnancy warning: It is not known if this drug may harm an unborn child. For this reason, be sure to talk with your doctor if you are pregnant or planning to become pregnant while receiving  this drug. Let your doctor know right away if you think you may be pregnant   Breastfeeding warning: It is not known if this drug passes into breast milk. For this reason, women should talk to their doctor about the risks and benefits of breastfeeding during treatment with this drug because this drug may enter the breast milk and cause harm to a breastfeeding baby.   Fertility warning: Human fertility studies have not been done with this drug. Talk with your doctor or nurse if you plan to have children. Ask for information on sperm or egg banking.   5-Fluorouracil (Adrucil; 5FU)  About This Drug  Fluorouracil is used to treat cancer. It is given in the vein (IV). It is given as an IV push from a syringe and also as a continuous infusion given via an ambulatory pump (a pump you take home and wear for a specified amount of time).  Possible Side Effects   Bone marrow suppression. This is a decrease in the number of white blood cells, red blood cells, and platelets. This may raise your risk of infection, make you tired and weak (fatigue), and raise your risk of bleeding   Changes in the tissue of the heart and/or heart attack. Some changes may happen that can cause your heart to have less ability to pump blood.   Blurred vision or other changes in eyesight   Nausea and throwing up (vomiting)   Diarrhea (loose bowel movements)   Ulcers - sores that may cause pain or bleeding in your digestive tract, which includes your mouth, esophagus, stomach, small/large intestines and rectum   Soreness of the mouth and throat. You may have red areas, white patches, or sores that hurt.   Allergic reactions, including anaphylaxis are rare but may happen in some patients. Signs of allergic reaction to this drug may be swelling  of the face, feeling like your tongue or throat are swelling, trouble breathing, rash, itching, fever, chills, feeling dizzy, and/or feeling that your heart is beating in a fast or  not normal way. If this happens, do not take another dose of this drug. You should get urgent medical treatment.   Sensitivity to light (photosensitivity). Photosensitivity means that you may become more sensitive to the sun and/or light. You may get a skin rash/reaction if you are in the sun or are exposed to sun lamps and tanning beds. Your eyes may water more, mostly in bright light.   Changes in your nail color, nail loss and/or brittle nail   Darkening of the skin, or changes to the color of your skin and/or veins used for infusion   Rash, dry skin, or itching  Note: Not all possible side effects are included above.  Warnings and Precautions   Hand-and-foot syndrome. The palms of your hands or soles of your feet may tingle, become numb, painful, swollen, or red.   Changes in your central nervous system can happen. The central nervous system is made up of your brain and spinal cord. You could feel extreme tiredness, agitation, confusion, hallucinations (see or hear things that are not there), trouble understanding or speaking, loss of control of your bowels or bladder, eyesight changes, numbness or lack of strength to your arms, legs, face, or body, or coma. If you start to have any of these symptoms let your doctor know right away.   Side effects of this drug may be unexpectedly severe in some patients  Note: Some of the side effects above are very rare. If you have concerns and/or questions, please discuss them with your medical team.   Important Information   This drug may be present in the saliva, tears, sweat, urine, stool, vomit, semen, and vaginal secretions. Talk to your doctor and/or your nurse about the necessary precautions to take during this time.   Treating Side Effects   Manage tiredness by pacing your activities for the day.   Be sure to include periods of rest between energy-draining activities.   To help decrease the risk of infections, wash your hands  regularly.   Avoid close contact with people who have a cold, the flu, or other infections.   Take your temperature as your doctor or nurse tells you, and whenever you feel like you may have a fever.   Use a soft toothbrush. Check with your nurse before using dental floss.   Be very careful when using knives or tools.   Use an electric shaver instead of a razor.   If you have a nose bleed, sit with your head tipped slightly forward. Apply pressure by lightly pinching the bridge of your nose between your thumb and forefinger. Call your doctor if you feel dizzy or faint or if the bleeding doesn't stop after 10 to 15 minutes.   Drink plenty of fluids (a minimum of eight glasses per day is recommended).   If you throw up or have loose bowel movements, you should drink more fluids so that you do not  become dehydrated (lack of water in the body from losing too much fluid).   To help with nausea and vomiting, eat small, frequent meals instead of three large meals a day. Choose foods and drinks that are at room temperature. Ask your nurse or doctor about other helpful tips and medicine that is available to help, stop, or lessen these symptoms.   If you  have diarrhea, eat low-fiber foods that are high in protein and calories and avoid foods that can irritate your digestive tracts or lead to cramping.   Ask your nurse or doctor about medicine that can lessen or stop your diarrhea.   Mouth care is very important. Your mouth care should consist of routine, gentle cleaning of your teeth or dentures and rinsing your mouth with a mixture of 1/2 teaspoon of salt in 8 ounces of water or 1/2 teaspoon of baking soda in 8 ounces of water. This should be done at least after each meal and at bedtime.   If you have mouth sores, avoid mouthwash that has alcohol. Also avoid alcohol and smoking because they can bother your mouth and throat.   Keeping your nails moisturized may help with brittleness.   To help  with itching, moisturize your skin several times day.   Use sunscreen with SPF 30 or higher when you are outdoors even for a short time. Cover up when you are out in the sun. Wear wide-brimmed hats, long-sleeved shirts, and pants. Keep your neck, chest, and back covered. Wear dark sun glasses when in the sun or bright lights.   If you get a rash do not put anything on it unless your doctor or nurse says you may. Keep the area around the rash clean and dry. Ask your doctor for medicine if your rash bothers you.   Keeping your pain under control is important to your well-being. Please tell your doctor or nurse if you are experiencing pain.   Food and Drug Interactions   There are no known interactions of fluorouracil with food.   Check with your doctor or pharmacist about all other prescription medicines and over-the-counter medicines and dietary supplements (vitamins, minerals, herbs and others) you are taking before starting this medicine as there are known drug interactions with 5-fluoroucacil. Also, check with your doctor or pharmacist before starting any new prescription or over-the-counter medicines, or dietary supplements to make sure that there are no interactions.   When to Call the Doctor  Call your doctor or nurse if you have any of these symptoms and/or any new or unusual symptoms:   Fever of 100.4 F (38 C) or higher   Chills   Easy bleeding or bruising   Nose bleed that doesn't stop bleeding after 10-15 minutes   Trouble breathing   Feeling dizzy or lightheaded   Feeling that your heart is beating in a fast or not normal way (palpitations)   Chest pain or symptoms of a heart attack. Most heart attacks involve pain in the center of the chest that lasts more than a few minutes. The pain may go away and come back or it can be constant. It can feel like pressure, squeezing, fullness, or pain. Sometimes pain is felt in one or both arms, the back, neck, jaw, or stomach. If  any of these symptoms last 2 minutes, call 911.   Confusion and/or agitation   Hallucinations   Trouble understanding or speaking   Loss of control of bowels or bladder   Blurry vision or changes in your eyesight   Headache that does not go away   Numbness or lack of strength to your arms, legs, face, or body   Nausea that stops you from eating or drinking and/or is not relieved by prescribed medicines   Throwing up    Diarrhea, 4 times in one day or diarrhea with lack of strength or a feeling of being  dizzy   Pain in your mouth or throat that makes it hard to eat or drink   Pain along the digestive tract - especially if worse after eating   Blood in your vomit (bright red or coffee-ground) and/or stools (bright red, or black/tarry)   Coughing up blood   Tiredness that interferes with your daily activities   Painful, red, or swollen areas on your hands or feet or around your nails   A new rash or a rash that is not relieved by prescribed medicines   Develop sensitivity to sunlight/light   Numbness and/or tingling of your hands and/or feet   Signs of allergic reaction: swelling of the face, feeling like your tongue or throat are swelling, trouble breathing, rash, itching, fever, chills, feeling dizzy, and/or feeling that your heart is beating in a fast or not normal way. If this happens, call 911 for emergency care.   If you think you are pregnant or may have impregnated your partner  Reproduction Warnings   Pregnancy warning: This drug may have harmful effects on the unborn baby. Women of child bearing potential should use effective methods of birth control during your cancer treatment and 3 months after treatment. Men with male partners of childbearing potential should use effective methods of birth control during your cancer treatment and for 3 months after your cancer treatment. Let your doctor know right away if you think you may be pregnant or may have impregnated  your partner.   Breastfeeding warning: It is not known if this drug passes into breast milk. For this reason, Women should not breastfeed during treatment because this drug could enter the breast milk and cause harm to a breastfeeding baby.   Fertility warning: In men and women both, this drug may affect your ability to have children in the future. Talk with your doctor or nurse if you plan to have children. Ask for information on sperm or egg banking.   SELF CARE ACTIVITIES WHILE ON CHEMOTHERAPY/IMMUNOTHERAPY:  Hydration Increase your fluid intake and drink at least 64 ounces (2 liters) of water/decaffeinated beverages per day after treatment. You can still have your cup of coffee or soda but these beverages do not count as part of the 64 ounces that you need to drink daily. Limit alcohol intake.  Medications Continue taking your normal prescription medication as prescribed.  If you start any new herbal or new supplements please let us know first to make sure it is safe.  Mouth Care Have teeth cleaned professionally before starting treatment. Keep dentures and partial plates clean. Use soft toothbrush and do not use mouthwashes that contain alcohol. Biotene is a good mouthwash that is available at most pharmacies or may be ordered by calling [phone]. Use warm salt water gargles (1 teaspoon salt per 1 quart warm water) before and after meals and at bedtime. If you are still having problems with your mouth or sores in your mouth please call the clinic. If you need dental work, please let the doctor know before you go for your appointment so that we can coordinate the best possible time for you in regards to your chemo regimen. You need to also let your dentist know that you are actively taking chemo. We may need to do labs prior to your dental appointment.  Skin Care Always use sunscreen that has not expired and with SPF (Sun Protection Factor) of 50 or higher. Wear hats to protect your head  from the sun. Remember to use sunscreen  on your hands, ears, face, & feet.  Use good moisturizing lotions such as udder cream, eucerin, or even Vaseline. Some chemotherapies can cause dry skin, color changes in your skin and nails.    Avoid long, hot showers or baths. Use gentle, fragrance-free soaps and laundry detergent. Use moisturizers, preferably creams or ointments rather than lotions because the thicker consistency is better at preventing skin dehydration. Apply the cream or ointment within 15 minutes of showering. Reapply moisturizer at night, and moisturize your hands every time after you wash them.   Infection Prevention Please wash your hands for at least 30 seconds using warm soapy water. Handwashing is the #1 way to prevent the spread of germs. Stay away from sick people or people who are getting over a cold. If you develop respiratory systems such as green/yellow mucus production or productive cough or persistent cough let us know and we will see if you need an antibiotic. It is a good idea to keep a pair of gloves on when going into grocery stores/[employer] to decrease your risk of coming into contact with germs on the carts, etc. Carry alcohol hand gel with you at all times and use it frequently if out in public. If your temperature reaches 100.5 or higher please call the clinic and let us know.  If it is after hours or on the weekend please go to the ER if your temperature is over 100.4.  Please have your own personal thermometer at home to use.    Sex and bodily fluids If you are going to have sex, a condom must be used to protect the person that isn't taking immunotherapy. For a few days after treatment, immunotherapy can be excreted through your bodily fluids.  When using the toilet please close the lid and flush the toilet twice.  Do this for a few day after you have had immunotherapy.   Contraception It is not known for sure whether or not immunotherapy drugs can be passed on  through semen or secretions from the vagina. Because of this some doctors advise people to use a barrier method if you have sex during treatment. This applies to vaginal, anal or oral sex.  Generally, doctors advise a barrier method only for the time you are actually having the treatment and for about a week after your treatment.  Advice like this can be worrying, but this does not mean that you have to avoid being intimate with your partner. You can still have close contact with your partner and continue to enjoy sex.  Animals If you have cats or birds we ask that you not change the litter or change the cage.  Please have someone else do this for you while you are on immunotherapy.   Food Safety During and After Cancer Treatment Food safety is important for people both during and after cancer treatment. Cancer and cancer treatments, such as chemotherapy, radiation therapy, and stem cell/bone marrow transplantation, often weaken the immune system. This makes it harder for your body to protect itself from foodborne illness, also called food poisoning. Foodborne illness is caused by eating food that contains harmful bacteria, parasites, or viruses.  Foods to avoid Some foods have a higher risk of becoming tainted with bacteria. These include: Unwashed fresh fruit and vegetables, especially leafy vegetables that can hide dirt and other contaminants Raw sprouts, such as alfalfa sprouts Raw or undercooked beef, especially ground beef, or other raw or undercooked meat and poultry Fatty, fried, or spicy foods immediately  before or after treatment.  These can sit heavy on your stomach and make you feel nauseous. Raw or undercooked shellfish, such as oysters. Sushi and sashimi, which often contain raw fish.  Unpasteurized beverages, such as unpasteurized fruit juices, raw milk, raw yogurt, or cider Undercooked eggs, such as soft boiled, over easy, and poached; raw, unpasteurized eggs; or foods made with  raw egg, such as homemade raw cookie dough and homemade mayonnaise  Simple steps for food safety  Shop smart. Do not buy food stored or displayed in an unclean area. Do not buy bruised or damaged fruits or vegetables. Do not buy cans that have cracks, dents, or bulges. Pick up foods that can spoil at the end of your shopping trip and store them in a cooler on the way home.  Prepare and clean up foods carefully. Rinse all fresh fruits and vegetables under running water, and dry them with a clean towel or paper towel. Clean the top of cans before opening them. After preparing food, wash your hands for 20 seconds with hot water and soap. Pay special attention to areas between fingers and under nails. Clean your utensils and dishes with hot water and soap. Disinfect your kitchen and cutting boards using 1 teaspoon of liquid, unscented bleach mixed into 1 quart of water.    Dispose of old food. Eat canned and packaged food before its expiration date (the "use by" or "best before" date). Consume refrigerated leftovers within 3 to 4 days. After that time, throw out the food. Even if the food does not smell or look spoiled, it still may be unsafe. Some bacteria, such as Listeria, can grow even on foods stored in the refrigerator if they are kept for too long.  Take precautions when eating out. At restaurants, avoid buffets and salad bars where food sits out for a long time and comes in contact with many people. Food can become contaminated when someone with a virus, often a norovirus, or another "bug" handles it. Put any leftover food in a "to-go" container yourself, rather than having the server do it. And, refrigerate leftovers as soon as you get home. Choose restaurants that are clean and that are willing to prepare your food as you order it cooked.    SYMPTOMS TO REPORT AS SOON AS POSSIBLE AFTER TREATMENT:  FEVER GREATER THAN 100.4 F CHILLS WITH OR WITHOUT FEVER NAUSEA AND VOMITING THAT  IS NOT CONTROLLED WITH YOUR NAUSEA MEDICATION UNUSUAL SHORTNESS OF BREATH UNUSUAL BRUISING OR BLEEDING TENDERNESS IN MOUTH AND THROAT WITH OR WITHOUT PRESENCE OF ULCERS URINARY PROBLEMS BOWEL PROBLEMS UNUSUAL RASH     Wear comfortable clothing and clothing appropriate for easy access to any Portacath or PICC line. Let us know if there is anything that we can do to make your therapy better!   What to do if you need assistance after hours or on the weekends: CALL [phone].  HOLD on the line, do not hang up.  You will hear multiple messages but at the end you will be connected with a nurse triage line.  They will contact the doctor if necessary.  Most of the time they will be able to assist you.  Do not call the hospital operator.    I have been informed and understand all of the instructions given to me and have received a copy. I have been instructed to call the clinic [phone] or my family physician as soon as possible for continued medical care, if indicated. I do  not have any more questions at this time but understand that I may call the Cancer Center or the Patient Navigator at [phone] during office hours should I have questions or need assistance in obtaining follow-up care.

## 2022-10-02 ENCOUNTER — Other Ambulatory Visit: Payer: Self-pay

## 2022-10-02 ENCOUNTER — Inpatient Hospital Stay (HOSPITAL_COMMUNITY)
Admission: EM | Admit: 2022-10-02 | Discharge: 2022-10-21 | DRG: 208 | Disposition: E | Payer: No Typology Code available for payment source | Attending: Pulmonary Disease | Admitting: Pulmonary Disease

## 2022-10-02 ENCOUNTER — Emergency Department (HOSPITAL_COMMUNITY): Payer: No Typology Code available for payment source

## 2022-10-02 ENCOUNTER — Ambulatory Visit (HOSPITAL_COMMUNITY)
Admission: RE | Admit: 2022-10-02 | Discharge: 2022-10-02 | Disposition: A | Discharge status: Home / Self Care | Payer: No Typology Code available for payment source | Source: Ambulatory Visit | Attending: Hematology | Admitting: Hematology

## 2022-10-02 ENCOUNTER — Encounter (HOSPITAL_COMMUNITY): Payer: Self-pay

## 2022-10-02 ENCOUNTER — Encounter: Payer: Self-pay | Admitting: Physician Assistant

## 2022-10-02 DIAGNOSIS — R000 Tachycardia, unspecified: Secondary | ICD-10-CM | POA: Diagnosis present

## 2022-10-02 DIAGNOSIS — Z7982 Long term (current) use of aspirin: Secondary | ICD-10-CM

## 2022-10-02 DIAGNOSIS — G4733 Obstructive sleep apnea (adult) (pediatric): Secondary | ICD-10-CM | POA: Diagnosis present

## 2022-10-02 DIAGNOSIS — I10 Essential (primary) hypertension: Secondary | ICD-10-CM | POA: Diagnosis present

## 2022-10-02 DIAGNOSIS — Z8042 Family history of malignant neoplasm of prostate: Secondary | ICD-10-CM

## 2022-10-02 DIAGNOSIS — E876 Hypokalemia: Secondary | ICD-10-CM | POA: Diagnosis present

## 2022-10-02 DIAGNOSIS — C259 Malignant neoplasm of pancreas, unspecified: Secondary | ICD-10-CM | POA: Diagnosis present

## 2022-10-02 DIAGNOSIS — Z515 Encounter for palliative care: Secondary | ICD-10-CM

## 2022-10-02 DIAGNOSIS — L89316 Pressure-induced deep tissue damage of right buttock: Secondary | ICD-10-CM | POA: Diagnosis not present

## 2022-10-02 DIAGNOSIS — Z8249 Family history of ischemic heart disease and other diseases of the circulatory system: Secondary | ICD-10-CM

## 2022-10-02 DIAGNOSIS — N179 Acute kidney failure, unspecified: Secondary | ICD-10-CM | POA: Diagnosis not present

## 2022-10-02 DIAGNOSIS — K7290 Hepatic failure, unspecified without coma: Secondary | ICD-10-CM | POA: Diagnosis not present

## 2022-10-02 DIAGNOSIS — D72829 Elevated white blood cell count, unspecified: Secondary | ICD-10-CM | POA: Diagnosis present

## 2022-10-02 DIAGNOSIS — J9811 Atelectasis: Secondary | ICD-10-CM | POA: Diagnosis present

## 2022-10-02 DIAGNOSIS — Z833 Family history of diabetes mellitus: Secondary | ICD-10-CM

## 2022-10-02 DIAGNOSIS — Z79899 Other long term (current) drug therapy: Secondary | ICD-10-CM

## 2022-10-02 DIAGNOSIS — J9601 Acute respiratory failure with hypoxia: Secondary | ICD-10-CM | POA: Diagnosis present

## 2022-10-02 DIAGNOSIS — Z87891 Personal history of nicotine dependence: Secondary | ICD-10-CM

## 2022-10-02 DIAGNOSIS — E785 Hyperlipidemia, unspecified: Secondary | ICD-10-CM | POA: Diagnosis present

## 2022-10-02 DIAGNOSIS — Z539 Procedure and treatment not carried out, unspecified reason: Secondary | ICD-10-CM | POA: Diagnosis not present

## 2022-10-02 DIAGNOSIS — C787 Secondary malignant neoplasm of liver and intrahepatic bile duct: Secondary | ICD-10-CM | POA: Diagnosis present

## 2022-10-02 DIAGNOSIS — J90 Pleural effusion, not elsewhere classified: Secondary | ICD-10-CM | POA: Diagnosis present

## 2022-10-02 DIAGNOSIS — Z7251 High risk heterosexual behavior: Secondary | ICD-10-CM

## 2022-10-02 DIAGNOSIS — G9341 Metabolic encephalopathy: Secondary | ICD-10-CM | POA: Diagnosis present

## 2022-10-02 DIAGNOSIS — I4891 Unspecified atrial fibrillation: Secondary | ICD-10-CM | POA: Diagnosis present

## 2022-10-02 DIAGNOSIS — I2699 Other pulmonary embolism without acute cor pulmonale: Secondary | ICD-10-CM | POA: Diagnosis not present

## 2022-10-02 DIAGNOSIS — Z781 Physical restraint status: Secondary | ICD-10-CM

## 2022-10-02 DIAGNOSIS — K219 Gastro-esophageal reflux disease without esophagitis: Secondary | ICD-10-CM | POA: Diagnosis present

## 2022-10-02 DIAGNOSIS — Z85828 Personal history of other malignant neoplasm of skin: Secondary | ICD-10-CM

## 2022-10-02 DIAGNOSIS — Z6841 Body Mass Index (BMI) 40.0 and over, adult: Secondary | ICD-10-CM

## 2022-10-02 DIAGNOSIS — L89326 Pressure-induced deep tissue damage of left buttock: Secondary | ICD-10-CM | POA: Diagnosis not present

## 2022-10-02 DIAGNOSIS — J9602 Acute respiratory failure with hypercapnia: Secondary | ICD-10-CM | POA: Diagnosis present

## 2022-10-02 DIAGNOSIS — H409 Unspecified glaucoma: Secondary | ICD-10-CM | POA: Diagnosis present

## 2022-10-02 DIAGNOSIS — J45909 Unspecified asthma, uncomplicated: Secondary | ICD-10-CM | POA: Diagnosis present

## 2022-10-02 DIAGNOSIS — Z66 Do not resuscitate: Secondary | ICD-10-CM | POA: Diagnosis not present

## 2022-10-02 DIAGNOSIS — Z7901 Long term (current) use of anticoagulants: Secondary | ICD-10-CM

## 2022-10-02 LAB — COMPREHENSIVE METABOLIC PANEL
ALT: 35 U/L (ref 0–44)
AST: 52 U/L — ABNORMAL HIGH (ref 15–41)
Albumin: <1.5 g/dL — ABNORMAL LOW (ref 3.5–5.0)
Alkaline Phosphatase: 207 U/L — ABNORMAL HIGH (ref 38–126)
Anion gap: 4 — ABNORMAL LOW (ref 5–15)
BUN: 28 mg/dL — ABNORMAL HIGH (ref 8–23)
CO2: 18 mmol/L — ABNORMAL LOW (ref 22–32)
Calcium: 8.7 mg/dL — ABNORMAL LOW (ref 8.9–10.3)
Chloride: 122 mmol/L — ABNORMAL HIGH (ref 98–111)
Creatinine, Ser: 0.54 mg/dL — ABNORMAL LOW (ref 0.61–1.24)
GFR, Estimated: >60 mL/min (ref >60)
Glucose, Bld: 87 mg/dL (ref 70–99)
Potassium: 2.4 mmol/L — CL (ref 3.5–5.1)
Sodium: 144 mmol/L (ref 135–145)
Total Bilirubin: 2.1 mg/dL — ABNORMAL HIGH (ref 0.3–1.2)
Total Protein: 3.7 g/dL — ABNORMAL LOW (ref 6.5–8.1)

## 2022-10-02 LAB — GLUCOSE, CAPILLARY: Glucose-Capillary: 127 mg/dL — ABNORMAL HIGH (ref 70–99)

## 2022-10-02 LAB — CBC WITH DIFFERENTIAL/PLATELET
Abs Immature Granulocytes: 0.7 10*3/uL — ABNORMAL HIGH (ref 0.00–0.07)
Basophils Absolute: 0.1 10*3/uL (ref 0.0–0.1)
Basophils Relative: 0 %
Eosinophils Absolute: 2.5 10*3/uL — ABNORMAL HIGH (ref 0.0–0.5)
Eosinophils Relative: 6 %
HCT: 42 % (ref 39.0–52.0)
Hemoglobin: 13.1 g/dL (ref 13.0–17.0)
Immature Granulocytes: 2 %
Lymphocytes Relative: 3 %
Lymphs Abs: 1.4 10*3/uL (ref 0.7–4.0)
MCH: 27 pg (ref 26.0–34.0)
MCHC: 31.2 g/dL (ref 30.0–36.0)
MCV: 86.6 fL (ref 80.0–100.0)
Monocytes Absolute: 2.1 10*3/uL — ABNORMAL HIGH (ref 0.1–1.0)
Monocytes Relative: 5 %
Neutro Abs: 34.8 10*3/uL — ABNORMAL HIGH (ref 1.7–7.7)
Neutrophils Relative %: 84 %
Platelets: 193 10*3/uL (ref 150–400)
RBC: 4.85 MIL/uL (ref 4.22–5.81)
RDW: 19.3 % — ABNORMAL HIGH (ref 11.5–15.5)
WBC: 41.6 10*3/uL — ABNORMAL HIGH (ref 4.0–10.5)
nRBC: 0 % (ref 0.0–0.2)

## 2022-10-02 LAB — URINALYSIS, ROUTINE W REFLEX MICROSCOPIC
Bilirubin Urine: NEGATIVE
Glucose, UA: NEGATIVE mg/dL
Hgb urine dipstick: NEGATIVE
Ketones, ur: NEGATIVE mg/dL
Leukocytes,Ua: NEGATIVE
Nitrite: NEGATIVE
Protein, ur: NEGATIVE mg/dL
Specific Gravity, Urine: 1.025 (ref 1.005–1.030)
pH: 5 (ref 5.0–8.0)

## 2022-10-02 LAB — PROTIME-INR
INR: 1.4 — ABNORMAL HIGH (ref 0.8–1.2)
Prothrombin Time: 17 seconds — ABNORMAL HIGH (ref 11.4–15.2)

## 2022-10-02 LAB — TROPONIN I (HIGH SENSITIVITY)
Troponin I (High Sensitivity): 15 ng/L (ref <18)
Troponin I (High Sensitivity): 11 ng/L (ref <18)

## 2022-10-02 LAB — BRAIN NATRIURETIC PEPTIDE: B Natriuretic Peptide: 88.1 pg/mL (ref 0.0–100.0)

## 2022-10-02 LAB — LACTIC ACID, PLASMA: Lactic Acid, Venous: 1.8 mmol/L (ref 0.5–1.9)

## 2022-10-02 MED ORDER — HEPARIN (PORCINE) 25000 UT/250ML-% IV SOLN
2600.0000 [IU]/h | INTRAVENOUS | Status: DC
  Administered 2022-10-02: 2300 [IU]/h via INTRAVENOUS
  Administered 2022-10-03: 2600 [IU]/h via INTRAVENOUS
  Filled 2022-10-02 (×2): qty 250

## 2022-10-02 MED ORDER — HEPARIN SOD (PORK) LOCK FLUSH 100 UNIT/ML IV SOLN
INTRAVENOUS | Status: AC
  Filled 2022-10-02: qty 5

## 2022-10-02 MED ORDER — DILTIAZEM HCL 25 MG/5ML IV SOLN
10.0000 mg | Freq: Once | INTRAVENOUS | Status: AC
  Administered 2022-10-02: 10 mg via INTRAVENOUS
  Filled 2022-10-02: qty 5

## 2022-10-02 MED ORDER — DILTIAZEM HCL-DEXTROSE 125-5 MG/125ML-% IV SOLN (PREMIX)
5.0000 mg/h | INTRAVENOUS | Status: DC
  Administered 2022-10-02: 5 mg/h via INTRAVENOUS
  Administered 2022-10-02: 7.5 mg/h via INTRAVENOUS
  Administered 2022-10-03: 15 mg/h via INTRAVENOUS
  Filled 2022-10-02 (×2): qty 125

## 2022-10-02 MED ORDER — MORPHINE SULFATE (PF) 4 MG/ML IV SOLN
4.0000 mg | Freq: Once | INTRAVENOUS | Status: AC
  Administered 2022-10-02: 4 mg via INTRAVENOUS
  Filled 2022-10-02: qty 1

## 2022-10-02 MED ORDER — LIDOCAINE-EPINEPHRINE 1 %-1:100000 IJ SOLN
INTRAMUSCULAR | Status: AC
  Filled 2022-10-02: qty 1

## 2022-10-02 MED ORDER — POTASSIUM CHLORIDE 10 MEQ/100ML IV SOLN
10.0000 meq | INTRAVENOUS | Status: AC
  Administered 2022-10-02 (×2): 10 meq via INTRAVENOUS
  Filled 2022-10-02 (×2): qty 100

## 2022-10-02 MED ORDER — SODIUM CHLORIDE 0.9 % IV SOLN: INTRAVENOUS | Status: DC

## 2022-10-02 MED ORDER — FENTANYL CITRATE (PF) 100 MCG/2ML IJ SOLN
INTRAMUSCULAR | Status: AC
  Filled 2022-10-02: qty 2

## 2022-10-02 MED ORDER — SODIUM CHLORIDE 0.9 % IV BOLUS
500.0000 mL | Freq: Once | INTRAVENOUS | Status: AC
  Administered 2022-10-02: 500 mL via INTRAVENOUS

## 2022-10-02 MED ORDER — MIDAZOLAM HCL 2 MG/2ML IJ SOLN
INTRAMUSCULAR | Status: AC
  Filled 2022-10-02: qty 2

## 2022-10-02 MED ORDER — HEPARIN BOLUS VIA INFUSION
4000.0000 [IU] | Freq: Once | INTRAVENOUS | Status: AC
  Administered 2022-10-02: 4000 [IU] via INTRAVENOUS
  Filled 2022-10-02: qty 4000

## 2022-10-02 MED ORDER — IOHEXOL 350 MG/ML SOLN
100.0000 mL | Freq: Once | INTRAVENOUS | Status: AC | PRN
  Administered 2022-10-02: 100 mL via INTRAVENOUS

## 2022-10-02 NOTE — ED Triage Notes (Addendum)
Patient brought over from radiology. Was scheduled to have a port placed today. Has stage 4 pancreatic cancer. Heart rate was in 140s. Recently diagnosed with a fib 2 weeks ago. Has not begun chemotherapy yet.

## 2022-10-02 NOTE — Progress Notes (Addendum)
Patient was scheduled for port placement today, but I was contacted by radiology PA due to concerns regarding patient A-fib/RVR with increased lethargy.  Port placement delayed and patient was sent to ED for further evaluation.

## 2022-10-02 NOTE — Progress Notes (Signed)
ANTICOAGULATION CONSULT NOTE - Initial Consult  Pharmacy Consult for Heparin Indication: pulmonary embolus  No Known Allergies  Patient Measurements: Height: 6\' 11"  (210.8 cm) Weight: (!) 148 kg (326 lb 4.5 oz) IBW/kg (Calculated) : 102.9 Heparin Dosing Weight: 134 kg  Vital Signs: Temp: 97.4 F (36.3 C) (04/12 1853) Temp Source: Oral (04/12 1853) BP: 129/100 (04/12 2145) Pulse Rate: 108 (04/12 2145)  Labs: Recent Labs    10/11/2022 1455 09/27/2022 1705 10/12/2022 1711  HGB 13.1  --   --   HCT 42.0  --   --   PLT 193  --   --   LABPROT  --  17.0*  --   INR  --  1.4*  --   CREATININE  --  0.54*  --   TROPONINIHS 15  --  11    Estimated Creatinine Clearance: 161.6 mL/min (A) (by C-G formula based on SCr of 0.54 mg/dL (L)).   Medical History: Past Medical History:  Diagnosis Date   Cancer (HCC)    skin cancer   GERD (gastroesophageal reflux disease)    Glaucoma    High risk sexual behavior    on PrEP for HIV   Hyperlipidemia    Hypertension    Sleep apnea     Medications:  No oral anticoagulation PTA  Assessment: 62 yr male with recently diagnosed AFib (no oral anticoagulation) admitted with new Pulmonary Embolism.  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

## 2022-10-02 NOTE — ED Provider Notes (Incomplete)
[facility] EMERGENCY DEPARTMENT AT [facility] Provider Note   CSN: [id number] Arrival date & time: 09/24/2022  1431     History {Add pertinent medical, surgical, social history, OB history to HPI:1} Chief Complaint  Patient presents with   Tachycardia    Craig Cole is a 62 y.o. male.  HPI   62 year old male with recently diagnosed atrial fibrillation on Cardizem, no anticoagulation, skin cancer with new found pancreas/liver mets with plan to undergo active treatment, HTN, HLD presents to the emergency department from short stay for evaluation of atrial fibrillation/RVR. He was at short stay to get a port placed for continued treatment.  Patient states he has been feeling generally unwell.  He has been attributing this to his ongoing cancer status and treatment.  Does endorse exertional shortness of breath however feels like this is more baseline for him.   He states that he has been compliant with his medications.  Denies any chest pain, back pain or acute shortness of breath.  He has chronic mild edema of his lower extremities as not acutely changed.  No recent fever, vomiting, diarrhea, productive cough or genitourinary symptoms.  Home Medications Prior to Admission medications   Medication Sig Start Date End Date Taking? Authorizing Provider  aspirin EC 81 MG tablet Take 81 mg by mouth every evening.    [provider]  atorvastatin (LIPITOR) 20 MG tablet Take 1 tablet (20 mg total) by mouth daily. 09/20/22   Johnson, Clanford L, MD  dextrose 5 % SOLN 1,000 mL with fluorouracil 5 GM/100ML SOLN Inject into the vein.    [provider]  diltiazem (CARDIZEM CD) 300 MG 24 hr capsule Take 1 capsule (300 mg total) by mouth daily. 09/20/22   Johnson, Clanford L, MD  emtricitabine-tenofovir AF (DESCOVY) 200-25 MG tablet Take 1 tablet by mouth daily. 09/01/22   Kuppelweiser, Cassie L, RPH-CPP  esomeprazole (NEXIUM) 40 MG capsule Take 40 mg by mouth every morning.  05/31/19   [provider]  fluticasone (FLONASE) 50 MCG/ACT nasal spray Place 2 sprays into both nostrils daily for 14 days. 09/20/22 10/04/22  Johnson, Clanford L, MD  furosemide (LASIX) 20 MG tablet TAKE 1 TABLET BY MOUTH DAILY AS NEEDED FOR LOWER LEG SWELLING 06/02/21   [provider]  hydrALAZINE (APRESOLINE) 25 MG tablet Take 25 mg by mouth 2 (two) times daily. 09/22/22   [provider]  IRINOTECAN HCL IV Inject into the vein.    [provider]  LEUCOVORIN CALCIUM IV Inject into the vein.    [provider]  lidocaine-prilocaine (EMLA) cream Apply to affected area once 09/30/22   Doreatha Massed, MD  Metoprolol Tartrate 75 MG TABS Take 1 tablet (75 mg total) by mouth 2 (two) times daily. 09/19/22   Johnson, Clanford L, MD  montelukast (SINGULAIR) 10 MG tablet Take 10 mg by mouth at bedtime. 05/31/19   [provider]  Multiple Vitamins-Minerals (ZINC PO) Take 1 tablet by mouth daily.    [provider]  ondansetron (ZOFRAN-ODT) 4 MG disintegrating tablet Take 1 tablet (4 mg total) by mouth every 8 (eight) hours as needed for nausea or vomiting. 09/06/22   Leath-Warren, Sadie Haber, NP  OXALIPLATIN IV Inject into the vein.    [provider]  prochlorperazine (COMPAZINE) 10 MG tablet Take 1 tablet (10 mg total) by mouth every 6 (six) hours as needed for nausea or vomiting (Nausea or vomiting). 09/30/22   Doreatha Massed, MD  promethazine-dextromethorphan (PROMETHAZINE-DM) 6.25-15 MG/5ML  syrup Take 5 mLs by mouth at bedtime as needed for cough. Do not take with alcohol or while driving or operating heavy machinery.  May cause drowsiness. 08/26/22   Valentino Nose, NP  tadalafil (CIALIS) 5 MG tablet Take 5 mg by mouth in the morning. 09/27/20   [provider]  Tafluprost, PF, 0.0015 % SOLN Place 1 drop into both eyes at bedtime.    [provider]  tamsulosin (FLOMAX) 0.4 MG CAPS capsule Take 0.4 mg by  mouth at bedtime. 05/31/19   [provider]      Allergies    Patient has no known allergies.    Review of Systems   Review of Systems  Constitutional:  Positive for appetite change, chills and fatigue.  Respiratory:  Positive for shortness of breath.   Cardiovascular:  Positive for leg swelling. Negative for palpitations.  Gastrointestinal:  Positive for nausea.  Genitourinary:  Positive for decreased urine volume.  Neurological:  Negative for dizziness.    Physical Exam Updated Vital Signs BP 106/76   Pulse (!) 104   Temp (!) 97.4 F (36.3 C) (Oral)   Resp 18   Ht  (2.108 m)   Wt (!) 148 kg   SpO2 95%   BMI 33.30 kg/m  Physical Exam Constitutional:      Appearance: He is ill-appearing.  HENT:     Mouth/Throat:     Mouth: Mucous membranes are dry.  Eyes:     Extraocular Movements: Extraocular movements intact.  Cardiovascular:     Rate and Rhythm: Tachycardia present. Rhythm irregular.  Pulmonary:     Comments: Diminished at bases bilaterally with scattered rales Abdominal:     General: There is no distension.  Skin:    Coloration: Skin is not jaundiced.  Neurological:     Mental Status: He is alert and oriented to person, place, and time.     ED Results / Procedures / Treatments   Labs (all labs ordered are listed, but only abnormal results are displayed) Labs Reviewed  CBC WITH DIFFERENTIAL/PLATELET  COMPREHENSIVE METABOLIC PANEL  PROTIME-INR  BRAIN NATRIURETIC PEPTIDE  URINALYSIS, ROUTINE W REFLEX MICROSCOPIC  TROPONIN I (HIGH SENSITIVITY)    EKG None  Radiology No results found.  Procedures Procedures  {Document cardiac monitor, telemetry assessment procedure when appropriate:1}  Medications Ordered in ED Medications  sodium chloride 0.9 % bolus 500 mL (500 mLs Intravenous New Bag/Given October 15, 2022 1616)  diltiazem (CARDIZEM) injection 10 mg (10 mg Intravenous Given Oct 15, 2022 1612)    ED Course/ Medical Decision Making/ A&P    {   Click here for ABCD2, HEART and other calculatorsREFRESH Note before signing :1}                          Medical Decision Making Amount and/or Complexity of Data Reviewed Labs: ordered. Radiology: ordered.  Risk Prescription drug management.   ***  {Document critical care time when appropriate:1} {Document review of labs and clinical decision tools ie heart score, Chads2Vasc2 etc:1}  {Document your independent review of radiology images, and any outside records:1} {Document your discussion with family members, caretakers, and with consultants:1} {Document social determinants of health affecting pt's care:1} {Document your decision making why or why not admission, treatments were needed:1} Final Clinical Impression(s) / ED Diagnoses Final diagnoses:  None    Rx / DC Orders ED Discharge Orders     None

## 2022-10-02 NOTE — Progress Notes (Signed)
Per kevin  ,PA take pt to ER for AFib RVR

## 2022-10-02 NOTE — ED Provider Notes (Incomplete)
[place] EMERGENCY DEPARTMENT AT [facility] Provider Note   CSN: [id number] Arrival date & time: 2022/10/19  1431     History {Add pertinent medical, surgical, social history, OB history to HPI:1} Chief Complaint  Patient presents with  . Tachycardia    Craig Cole is a 62 y.o. male.  HPI   62 year old male with recently diagnosed atrial fibrillation on Cardizem, no anticoagulation, skin cancer with new found pancreas/liver mets with plan to undergo active treatment, HTN, HLD presents to the emergency department from short stay for evaluation of atrial fibrillation/RVR. He was at short stay to get a port placed for continued treatment.  Patient states he has been feeling generally unwell.  He has been attributing this to his ongoing cancer status and treatment.  Does endorse exertional shortness of breath however feels like this is more baseline for him.   He states that he has been compliant with his medications.  Denies any chest pain, back pain or acute shortness of breath.  He has chronic mild edema of his lower extremities as not acutely changed.  No recent fever, vomiting, diarrhea, productive cough or genitourinary symptoms.  Home Medications Prior to Admission medications   Medication Sig Start Date End Date Taking? Authorizing Provider  aspirin EC 81 MG tablet Take 81 mg by mouth every evening.    [provider]  atorvastatin (LIPITOR) 20 MG tablet Take 1 tablet (20 mg total) by mouth daily. 09/20/22   Johnson, Clanford L, MD  dextrose 5 % SOLN 1,000 mL with fluorouracil 5 GM/100ML SOLN Inject into the vein.    [provider]  diltiazem (CARDIZEM CD) 300 MG 24 hr capsule Take 1 capsule (300 mg total) by mouth daily. 09/20/22   Johnson, Clanford L, MD  emtricitabine-tenofovir AF (DESCOVY) 200-25 MG tablet Take 1 tablet by mouth daily. 09/01/22   Kuppelweiser, Cassie L, RPH-CPP  esomeprazole (NEXIUM) 40 MG capsule Take 40 mg by mouth every morning.  05/31/19   [provider]  fluticasone (FLONASE) 50 MCG/ACT nasal spray Place 2 sprays into both nostrils daily for 14 days. 09/20/22 10/04/22  Johnson, Clanford L, MD  furosemide (LASIX) 20 MG tablet TAKE 1 TABLET BY MOUTH DAILY AS NEEDED FOR LOWER LEG SWELLING 06/02/21   [provider]  hydrALAZINE (APRESOLINE) 25 MG tablet Take 25 mg by mouth 2 (two) times daily. 09/22/22   [provider]  IRINOTECAN HCL IV Inject into the vein.    [provider]  LEUCOVORIN CALCIUM IV Inject into the vein.    [provider]  lidocaine-prilocaine (EMLA) cream Apply to affected area once 09/30/22   Doreatha Massed, MD  Metoprolol Tartrate 75 MG TABS Take 1 tablet (75 mg total) by mouth 2 (two) times daily. 09/19/22   Johnson, Clanford L, MD  montelukast (SINGULAIR) 10 MG tablet Take 10 mg by mouth at bedtime. 05/31/19   [provider]  Multiple Vitamins-Minerals (ZINC PO) Take 1 tablet by mouth daily.    [provider]  ondansetron (ZOFRAN-ODT) 4 MG disintegrating tablet Take 1 tablet (4 mg total) by mouth every 8 (eight) hours as needed for nausea or vomiting. 09/06/22   Leath-Warren, Sadie Haber, NP  OXALIPLATIN IV Inject into the vein.    [provider]  prochlorperazine (COMPAZINE) 10 MG tablet Take 1 tablet (10 mg total) by mouth every 6 (six) hours as needed for nausea or vomiting (Nausea or vomiting). 09/30/22   Doreatha Massed, MD  promethazine-dextromethorphan (PROMETHAZINE-DM) 6.25-15 MG/5ML  syrup Take 5 mLs by mouth at bedtime as needed for cough. Do not take with alcohol or while driving or operating heavy machinery.  May cause drowsiness. 08/26/22   Valentino Nose, NP  tadalafil (CIALIS) 5 MG tablet Take 5 mg by mouth in the morning. 09/27/20   [provider]  Tafluprost, PF, 0.0015 % SOLN Place 1 drop into both eyes at bedtime.    [provider]  tamsulosin (FLOMAX) 0.4 MG CAPS capsule Take 0.4 mg by  mouth at bedtime. 05/31/19   [provider]      Allergies    Patient has no known allergies.    Review of Systems   Review of Systems  Constitutional:  Positive for appetite change, chills and fatigue.  Respiratory:  Positive for shortness of breath.   Cardiovascular:  Positive for leg swelling. Negative for palpitations.  Gastrointestinal:  Positive for nausea.  Genitourinary:  Positive for decreased urine volume.  Neurological:  Negative for dizziness.    Physical Exam Updated Vital Signs BP 106/76   Pulse (!) 104   Temp (!) 97.4 F (36.3 C) (Oral)   Resp 18   Ht 6\' 11"  (2.108 m)   Wt (!) 148 kg   SpO2 95%   BMI 33.30 kg/m  Physical Exam Constitutional:      Appearance: He is ill-appearing.  HENT:     Mouth/Throat:     Mouth: Mucous membranes are dry.  Eyes:     Extraocular Movements: Extraocular movements intact.  Cardiovascular:     Rate and Rhythm: Tachycardia present. Rhythm irregular.  Pulmonary:     Comments: Diminished at bases bilaterally with scattered rales Abdominal:     General: There is no distension.  Skin:    Coloration: Skin is not jaundiced.  Neurological:     Mental Status: He is alert and oriented to person, place, and time.     ED Results / Procedures / Treatments   Labs (all labs ordered are listed, but only abnormal results are displayed) Labs Reviewed  CBC WITH DIFFERENTIAL/PLATELET  COMPREHENSIVE METABOLIC PANEL  PROTIME-INR  BRAIN NATRIURETIC PEPTIDE  URINALYSIS, ROUTINE W REFLEX MICROSCOPIC  TROPONIN I (HIGH SENSITIVITY)    EKG None  Radiology No results found.  Procedures Procedures  {Document cardiac monitor, telemetry assessment procedure when appropriate:1}  Medications Ordered in ED Medications  sodium chloride 0.9 % bolus 500 mL (500 mLs Intravenous New Bag/Given 09/27/2022 1616)  diltiazem (CARDIZEM) injection 10 mg (10 mg Intravenous Given 10/11/2022 1612)    ED Course/ Medical Decision Making/ A&P    {   Click here for ABCD2, HEART and other calculatorsREFRESH Note before signing :1}                          Medical Decision Making Amount and/or Complexity of Data Reviewed Labs: ordered. Radiology: ordered.  Risk Prescription drug management. Decision regarding hospitalization.   62 year old male presents to the emergency department  {Document critical care time when appropriate:1} {Document review of labs and clinical decision tools ie heart score, Chads2Vasc2 etc:1}  {Document your independent review of radiology images, and any outside records:1} {Document your discussion with family members, caretakers, and with consultants:1} {Document social determinants of health affecting pt's care:1} {Document your decision making why or why not admission, treatments were needed:1} Final Clinical Impression(s) / ED Diagnoses Final diagnoses:  None    Rx / DC Orders ED Discharge Orders     None

## 2022-10-02 NOTE — ED Notes (Signed)
Pt resting in bed with no acute distress noted. Pt wife at bedside.

## 2022-10-02 NOTE — ED Notes (Signed)
Mult attempts to place 20g for CTA unsuccessful.

## 2022-10-02 NOTE — Progress Notes (Addendum)
Patient ID: Craig Cole, male   DOB: [birth year], 62 y.o.   MRN: [id number] Pt presented to [facility] today for outpt port a cath placement to assist with treatment of metastatic pancreatic cancer. During preprocedure vital sign checks pt was noted to be in Afib with RVR (rates between 113-127); BP ok, pt afebrile, O2 sats 93% RA; pt has recent hx of afib during previous hospital admission and discharged on cardizem/metoprolol by [facility]. Pt is also lethargic today. Sister is present. Pt states that he took both cardizem and metoprolol this morning but HR remains high despite this. Above d/w pt's ordering /oncology team and they recommend transfer to ED for further evaluation. Pt/sister updated. Port a cath will be rescheduled at later date once pt stabilized.

## 2022-10-03 ENCOUNTER — Other Ambulatory Visit: Payer: Self-pay

## 2022-10-03 ENCOUNTER — Inpatient Hospital Stay (HOSPITAL_COMMUNITY): Payer: No Typology Code available for payment source

## 2022-10-03 DIAGNOSIS — E785 Hyperlipidemia, unspecified: Secondary | ICD-10-CM | POA: Diagnosis present

## 2022-10-03 DIAGNOSIS — Z66 Do not resuscitate: Secondary | ICD-10-CM | POA: Diagnosis not present

## 2022-10-03 DIAGNOSIS — G9341 Metabolic encephalopathy: Secondary | ICD-10-CM

## 2022-10-03 DIAGNOSIS — J9601 Acute respiratory failure with hypoxia: Secondary | ICD-10-CM | POA: Diagnosis present

## 2022-10-03 DIAGNOSIS — G4733 Obstructive sleep apnea (adult) (pediatric): Secondary | ICD-10-CM | POA: Diagnosis present

## 2022-10-03 DIAGNOSIS — C259 Malignant neoplasm of pancreas, unspecified: Secondary | ICD-10-CM | POA: Diagnosis present

## 2022-10-03 DIAGNOSIS — I2699 Other pulmonary embolism without acute cor pulmonale: Secondary | ICD-10-CM | POA: Diagnosis present

## 2022-10-03 DIAGNOSIS — K219 Gastro-esophageal reflux disease without esophagitis: Secondary | ICD-10-CM | POA: Diagnosis present

## 2022-10-03 DIAGNOSIS — C787 Secondary malignant neoplasm of liver and intrahepatic bile duct: Secondary | ICD-10-CM | POA: Diagnosis present

## 2022-10-03 DIAGNOSIS — J9811 Atelectasis: Secondary | ICD-10-CM | POA: Diagnosis present

## 2022-10-03 DIAGNOSIS — Z6841 Body Mass Index (BMI) 40.0 and over, adult: Secondary | ICD-10-CM | POA: Diagnosis not present

## 2022-10-03 DIAGNOSIS — I10 Essential (primary) hypertension: Secondary | ICD-10-CM | POA: Diagnosis present

## 2022-10-03 DIAGNOSIS — J90 Pleural effusion, not elsewhere classified: Secondary | ICD-10-CM | POA: Diagnosis present

## 2022-10-03 DIAGNOSIS — I4891 Unspecified atrial fibrillation: Secondary | ICD-10-CM

## 2022-10-03 DIAGNOSIS — E876 Hypokalemia: Secondary | ICD-10-CM | POA: Diagnosis present

## 2022-10-03 DIAGNOSIS — L89326 Pressure-induced deep tissue damage of left buttock: Secondary | ICD-10-CM | POA: Diagnosis not present

## 2022-10-03 DIAGNOSIS — N179 Acute kidney failure, unspecified: Secondary | ICD-10-CM | POA: Diagnosis not present

## 2022-10-03 DIAGNOSIS — J45909 Unspecified asthma, uncomplicated: Secondary | ICD-10-CM | POA: Diagnosis present

## 2022-10-03 DIAGNOSIS — L89316 Pressure-induced deep tissue damage of right buttock: Secondary | ICD-10-CM | POA: Diagnosis not present

## 2022-10-03 DIAGNOSIS — Z515 Encounter for palliative care: Secondary | ICD-10-CM | POA: Diagnosis not present

## 2022-10-03 DIAGNOSIS — J9602 Acute respiratory failure with hypercapnia: Secondary | ICD-10-CM | POA: Diagnosis present

## 2022-10-03 DIAGNOSIS — K7290 Hepatic failure, unspecified without coma: Secondary | ICD-10-CM | POA: Diagnosis not present

## 2022-10-03 LAB — BLOOD GAS, ARTERIAL
Acid-Base Excess: 1.2 mmol/L (ref 0.0–2.0)
Acid-Base Excess: 0.1 mmol/L (ref 0.0–2.0)
Bicarbonate: 27.6 mmol/L (ref 20.0–28.0)
Bicarbonate: 24.7 mmol/L (ref 20.0–28.0)
Drawn by: 365271
Drawn by: 36527
FIO2: 100 %
FIO2: 100 %
MECHVT: 650 mL
MECHVT: 650 mL
O2 Saturation: 94.9 %
O2 Saturation: 99.6 %
PEEP: 5 cmH2O
PEEP: 8 cmH2O
Patient temperature: 98.6
Patient temperature: 36.3
RATE: 16 resp/min
RATE: 20 resp/min
pCO2 arterial: 50 mmHg — ABNORMAL HIGH (ref 32–48)
pCO2 arterial: 38 mmHg (ref 32–48)
pH, Arterial: 7.35 (ref 7.35–7.45)
pH, Arterial: 7.42 (ref 7.35–7.45)
pO2, Arterial: 72 mmHg — ABNORMAL LOW (ref 83–108)
pO2, Arterial: 199 mmHg — ABNORMAL HIGH (ref 83–108)

## 2022-10-03 LAB — BLOOD GAS, VENOUS
Acid-Base Excess: 2.8 mmol/L — ABNORMAL HIGH (ref 0.0–2.0)
Acid-Base Excess: 6 mmol/L — ABNORMAL HIGH (ref 0.0–2.0)
Bicarbonate: 27.2 mmol/L (ref 20.0–28.0)
Bicarbonate: 30.6 mmol/L — ABNORMAL HIGH (ref 20.0–28.0)
O2 Saturation: 95.2 %
O2 Saturation: 85.3 %
Patient temperature: 37
Patient temperature: 37
pCO2, Ven: 40 mmHg — ABNORMAL LOW (ref 44–60)
pCO2, Ven: 43 mmHg — ABNORMAL LOW (ref 44–60)
pH, Ven: 7.44 — ABNORMAL HIGH (ref 7.25–7.43)
pH, Ven: 7.46 — ABNORMAL HIGH (ref 7.25–7.43)
pO2, Ven: 69 mmHg — ABNORMAL HIGH (ref 32–45)
pO2, Ven: 52 mmHg — ABNORMAL HIGH (ref 32–45)

## 2022-10-03 LAB — CBC WITH DIFFERENTIAL/PLATELET
Abs Immature Granulocytes: 2.2 10*3/uL — ABNORMAL HIGH (ref 0.00–0.07)
Basophils Absolute: 0.2 10*3/uL — ABNORMAL HIGH (ref 0.0–0.1)
Basophils Relative: 0 %
Eosinophils Absolute: 3.4 10*3/uL — ABNORMAL HIGH (ref 0.0–0.5)
Eosinophils Relative: 7 %
HCT: 42.6 % (ref 39.0–52.0)
Hemoglobin: 12.9 g/dL — ABNORMAL LOW (ref 13.0–17.0)
Immature Granulocytes: 5 %
Lymphocytes Relative: 3 %
Lymphs Abs: 1.4 10*3/uL (ref 0.7–4.0)
MCH: 26.6 pg (ref 26.0–34.0)
MCHC: 30.3 g/dL (ref 30.0–36.0)
MCV: 87.8 fL (ref 80.0–100.0)
Monocytes Absolute: 2.4 10*3/uL — ABNORMAL HIGH (ref 0.1–1.0)
Monocytes Relative: 5 %
Neutro Abs: 39.1 10*3/uL — ABNORMAL HIGH (ref 1.7–7.7)
Neutrophils Relative %: 80 %
Platelets: 180 10*3/uL (ref 150–400)
RBC: 4.85 MIL/uL (ref 4.22–5.81)
RDW: 20.2 % — ABNORMAL HIGH (ref 11.5–15.5)
WBC: 48.7 10*3/uL — ABNORMAL HIGH (ref 4.0–10.5)
nRBC: 0 % (ref 0.0–0.2)

## 2022-10-03 LAB — CULTURE, BLOOD (ROUTINE X 2)
Culture: NO GROWTH
Special Requests: ADEQUATE

## 2022-10-03 LAB — HEPARIN LEVEL (UNFRACTIONATED)
Heparin Unfractionated: <0.1 IU/mL — ABNORMAL LOW (ref 0.30–0.70)
Heparin Unfractionated: <0.1 IU/mL — ABNORMAL LOW (ref 0.30–0.70)
Heparin Unfractionated: 0.28 IU/mL — ABNORMAL LOW (ref 0.30–0.70)

## 2022-10-03 LAB — COMPREHENSIVE METABOLIC PANEL
ALT: 59 U/L — ABNORMAL HIGH (ref 0–44)
AST: 91 U/L — ABNORMAL HIGH (ref 15–41)
Albumin: 2 g/dL — ABNORMAL LOW (ref 3.5–5.0)
Alkaline Phosphatase: 294 U/L — ABNORMAL HIGH (ref 38–126)
Anion gap: 4 — ABNORMAL LOW (ref 5–15)
BUN: 36 mg/dL — ABNORMAL HIGH (ref 8–23)
CO2: 24 mmol/L (ref 22–32)
Calcium: 12.6 mg/dL — ABNORMAL HIGH (ref 8.9–10.3)
Chloride: 113 mmol/L — ABNORMAL HIGH (ref 98–111)
Creatinine, Ser: 0.77 mg/dL (ref 0.61–1.24)
GFR, Estimated: >60 mL/min (ref >60)
Glucose, Bld: 117 mg/dL — ABNORMAL HIGH (ref 70–99)
Potassium: 3.7 mmol/L (ref 3.5–5.1)
Sodium: 141 mmol/L (ref 135–145)
Total Bilirubin: 4.4 mg/dL — ABNORMAL HIGH (ref 0.3–1.2)
Total Protein: 5.6 g/dL — ABNORMAL LOW (ref 6.5–8.1)

## 2022-10-03 LAB — LACTIC ACID, PLASMA: Lactic Acid, Venous: 2.4 mmol/L (ref 0.5–1.9)

## 2022-10-03 LAB — MRSA NEXT GEN BY PCR, NASAL: MRSA by PCR Next Gen: NOT DETECTED

## 2022-10-03 LAB — TROPONIN I (HIGH SENSITIVITY): Troponin I (High Sensitivity): 15 ng/L (ref <18)

## 2022-10-03 LAB — MAGNESIUM: Magnesium: 2.1 mg/dL (ref 1.7–2.4)

## 2022-10-03 LAB — AMMONIA: Ammonia: 38 umol/L — ABNORMAL HIGH (ref 9–35)

## 2022-10-03 MED ORDER — LORAZEPAM 2 MG/ML PO CONC
1.0000 mg | Freq: Once | ORAL | Status: DC
  Filled 2022-10-03: qty 0.5

## 2022-10-03 MED ORDER — MONTELUKAST SODIUM 10 MG PO TABS
10.0000 mg | ORAL_TABLET | Freq: Every day | ORAL | Status: DC
  Administered 2022-10-03: 10 mg via ORAL
  Filled 2022-10-03: qty 1

## 2022-10-03 MED ORDER — DEXMEDETOMIDINE HCL IN NACL 400 MCG/100ML IV SOLN
0.0000 ug/kg/h | INTRAVENOUS | Status: DC
  Administered 2022-10-03: 0.9 ug/kg/h via INTRAVENOUS
  Administered 2022-10-03 (×2): 0.7 ug/kg/h via INTRAVENOUS
  Administered 2022-10-03: 0.4 ug/kg/h via INTRAVENOUS
  Administered 2022-10-04 (×5): 1 ug/kg/h via INTRAVENOUS
  Administered 2022-10-04: 1.1 ug/kg/h via INTRAVENOUS
  Administered 2022-10-04 (×2): 1 ug/kg/h via INTRAVENOUS
  Administered 2022-10-05 (×5): 1.1 ug/kg/h via INTRAVENOUS
  Administered 2022-10-05: 1.2 ug/kg/h via INTRAVENOUS
  Administered 2022-10-05: 1 ug/kg/h via INTRAVENOUS
  Administered 2022-10-05: 1.1 ug/kg/h via INTRAVENOUS
  Administered 2022-10-05: 1.2 ug/kg/h via INTRAVENOUS
  Administered 2022-10-06 (×2): 1.1 ug/kg/h via INTRAVENOUS
  Administered 2022-10-06: 0.9 ug/kg/h via INTRAVENOUS
  Administered 2022-10-06: 1.2 ug/kg/h via INTRAVENOUS
  Administered 2022-10-06: 1.1 ug/kg/h via INTRAVENOUS
  Administered 2022-10-06: 1.2 ug/kg/h via INTRAVENOUS
  Filled 2022-10-03 (×4): qty 100
  Filled 2022-10-03: qty 200
  Filled 2022-10-03 (×22): qty 100

## 2022-10-03 MED ORDER — HEPARIN BOLUS VIA INFUSION
2000.0000 [IU] | Freq: Once | INTRAVENOUS | Status: AC
  Administered 2022-10-03: 2000 [IU] via INTRAVENOUS
  Filled 2022-10-03: qty 2000

## 2022-10-03 MED ORDER — HEPARIN (PORCINE) 25000 UT/250ML-% IV SOLN
2800.0000 [IU]/h | INTRAVENOUS | Status: DC
  Administered 2022-10-03 – 2022-10-06 (×8): 2800 [IU]/h via INTRAVENOUS
  Filled 2022-10-03 (×8): qty 250

## 2022-10-03 MED ORDER — METOPROLOL TARTRATE 25 MG PO TABS: 75.0000 mg | ORAL_TABLET | Freq: Two times a day (BID) | ORAL | Status: DC

## 2022-10-03 MED ORDER — POLYETHYLENE GLYCOL 3350 17 G PO PACK
17.0000 g | PACK | Freq: Every day | ORAL | Status: DC
  Administered 2022-10-03 – 2022-10-05 (×3): 17 g
  Filled 2022-10-03 (×3): qty 1

## 2022-10-03 MED ORDER — ROCURONIUM BROMIDE 50 MG/5ML IV SOLN
50.0000 mg | Freq: Once | INTRAVENOUS | Status: AC
  Administered 2022-10-03: 50 mg via INTRAVENOUS
  Filled 2022-10-03: qty 5

## 2022-10-03 MED ORDER — FLUTICASONE PROPIONATE 50 MCG/ACT NA SUSP
2.0000 | Freq: Every day | NASAL | Status: DC
  Administered 2022-10-04: 2 via NASAL
  Filled 2022-10-03: qty 16

## 2022-10-03 MED ORDER — HALOPERIDOL LACTATE 5 MG/ML IJ SOLN
2.0000 mg | Freq: Four times a day (QID) | INTRAMUSCULAR | Status: DC | PRN
  Administered 2022-10-03: 2 mg via INTRAMUSCULAR
  Filled 2022-10-03: qty 1

## 2022-10-03 MED ORDER — SODIUM CHLORIDE 0.9 % IV SOLN
2.0000 g | INTRAVENOUS | Status: DC
  Administered 2022-10-03: 2 g via INTRAVENOUS
  Filled 2022-10-03: qty 20

## 2022-10-03 MED ORDER — DILTIAZEM HCL ER COATED BEADS 180 MG PO CP24
300.0000 mg | ORAL_CAPSULE | Freq: Every day | ORAL | Status: DC
  Filled 2022-10-03: qty 1

## 2022-10-03 MED ORDER — AMIODARONE LOAD VIA INFUSION
150.0000 mg | Freq: Once | INTRAVENOUS | Status: DC
  Filled 2022-10-03: qty 83.34

## 2022-10-03 MED ORDER — SODIUM CHLORIDE 0.9 % IV SOLN: INTRAVENOUS | Status: DC

## 2022-10-03 MED ORDER — ACETAMINOPHEN 325 MG PO TABS
650.0000 mg | ORAL_TABLET | Freq: Four times a day (QID) | ORAL | Status: DC | PRN
  Administered 2022-10-04 – 2022-10-05 (×4): 650 mg via ORAL
  Filled 2022-10-03 (×4): qty 2

## 2022-10-03 MED ORDER — FENTANYL CITRATE PF 50 MCG/ML IJ SOSY
50.0000 ug | PREFILLED_SYRINGE | Freq: Once | INTRAMUSCULAR | Status: AC
  Administered 2022-10-03: 50 ug via INTRAVENOUS

## 2022-10-03 MED ORDER — SODIUM CHLORIDE 0.9 % IV BOLUS
500.0000 mL | Freq: Once | INTRAVENOUS | Status: AC
  Administered 2022-10-03: 500 mL via INTRAVENOUS

## 2022-10-03 MED ORDER — PANTOPRAZOLE SODIUM 40 MG PO TBEC: 40.0000 mg | DELAYED_RELEASE_TABLET | Freq: Every day | ORAL | Status: DC

## 2022-10-03 MED ORDER — LORAZEPAM 2 MG/ML IJ SOLN
1.0000 mg | Freq: Once | INTRAMUSCULAR | Status: AC
  Administered 2022-10-03: 1 mg via INTRAVENOUS
  Filled 2022-10-03: qty 1

## 2022-10-03 MED ORDER — AMIODARONE IV BOLUS ONLY 150 MG/100ML: 150.0000 mg | Freq: Once | INTRAVENOUS | Status: DC

## 2022-10-03 MED ORDER — EMTRICITABINE-TENOFOVIR AF 200-25 MG PO TABS
1.0000 | ORAL_TABLET | Freq: Every day | ORAL | Status: DC
  Administered 2022-10-03 – 2022-10-05 (×3): 1
  Filled 2022-10-03 (×5): qty 1

## 2022-10-03 MED ORDER — NOREPINEPHRINE 4 MG/250ML-% IV SOLN
0.0000 ug/min | INTRAVENOUS | Status: DC
  Administered 2022-10-03: 15 ug/min via INTRAVENOUS
  Administered 2022-10-03 (×3): 21 ug/min via INTRAVENOUS
  Administered 2022-10-04: 26 ug/min via INTRAVENOUS
  Administered 2022-10-04: 23 ug/min via INTRAVENOUS
  Administered 2022-10-04: 24 ug/min via INTRAVENOUS
  Administered 2022-10-04: 23 ug/min via INTRAVENOUS
  Administered 2022-10-04: 25 ug/min via INTRAVENOUS
  Administered 2022-10-04 (×2): 24 ug/min via INTRAVENOUS
  Administered 2022-10-04: 23 ug/min via INTRAVENOUS
  Administered 2022-10-04: 24 ug/min via INTRAVENOUS
  Administered 2022-10-05: 30 ug/min via INTRAVENOUS
  Administered 2022-10-05: 26 ug/min via INTRAVENOUS
  Administered 2022-10-05 (×2): 35 ug/min via INTRAVENOUS
  Administered 2022-10-05 (×2): 30 ug/min via INTRAVENOUS
  Filled 2022-10-03 (×19): qty 250

## 2022-10-03 MED ORDER — SODIUM CHLORIDE 0.9 % IV BOLUS
1000.0000 mL | Freq: Once | INTRAVENOUS | Status: AC
  Administered 2022-10-03: 1000 mL via INTRAVENOUS

## 2022-10-03 MED ORDER — ORAL CARE MOUTH RINSE: 15.0000 mL | OROMUCOSAL | Status: DC | PRN

## 2022-10-03 MED ORDER — AMIODARONE LOAD VIA INFUSION
150.0000 mg | Freq: Once | INTRAVENOUS | Status: AC
  Administered 2022-10-03: 150 mg via INTRAVENOUS
  Filled 2022-10-03: qty 83.34

## 2022-10-03 MED ORDER — ORAL CARE MOUTH RINSE
15.0000 mL | OROMUCOSAL | Status: DC
  Administered 2022-10-03 – 2022-10-06 (×38): 15 mL via OROMUCOSAL

## 2022-10-03 MED ORDER — FENTANYL CITRATE PF 50 MCG/ML IJ SOSY
50.0000 ug | PREFILLED_SYRINGE | INTRAMUSCULAR | Status: DC | PRN
  Administered 2022-10-03: 50 ug via INTRAVENOUS
  Filled 2022-10-03: qty 1

## 2022-10-03 MED ORDER — NOREPINEPHRINE 4 MG/250ML-% IV SOLN
2.0000 ug/min | INTRAVENOUS | Status: DC
  Administered 2022-10-03: 2 ug/min via INTRAVENOUS
  Filled 2022-10-03: qty 250

## 2022-10-03 MED ORDER — ETOMIDATE 2 MG/ML IV SOLN
20.0000 mg | Freq: Once | INTRAVENOUS | Status: AC
  Administered 2022-10-03: 20 mg via INTRAVENOUS

## 2022-10-03 MED ORDER — AMIODARONE HCL IN DEXTROSE 360-4.14 MG/200ML-% IV SOLN
30.0000 mg/h | INTRAVENOUS | Status: DC
  Administered 2022-10-03 – 2022-10-06 (×6): 30 mg/h via INTRAVENOUS
  Filled 2022-10-03 (×6): qty 200

## 2022-10-03 MED ORDER — PANTOPRAZOLE SODIUM 40 MG IV SOLR
40.0000 mg | Freq: Every day | INTRAVENOUS | Status: DC
  Administered 2022-10-03 – 2022-10-05 (×3): 40 mg via INTRAVENOUS
  Filled 2022-10-03 (×3): qty 10

## 2022-10-03 MED ORDER — CHLORHEXIDINE GLUCONATE CLOTH 2 % EX PADS
6.0000 | MEDICATED_PAD | Freq: Every day | CUTANEOUS | Status: DC
  Administered 2022-10-03 – 2022-10-05 (×2): 6 via TOPICAL

## 2022-10-03 MED ORDER — AMIODARONE HCL IN DEXTROSE 360-4.14 MG/200ML-% IV SOLN
60.0000 mg/h | INTRAVENOUS | Status: AC
  Administered 2022-10-03 (×2): 60 mg/h via INTRAVENOUS
  Filled 2022-10-03 (×2): qty 200

## 2022-10-03 MED ORDER — HALOPERIDOL 1 MG PO TABS: 2.0000 mg | ORAL_TABLET | Freq: Four times a day (QID) | ORAL | Status: DC | PRN

## 2022-10-03 MED ORDER — ACETAMINOPHEN 650 MG RE SUPP: 650.0000 mg | Freq: Four times a day (QID) | RECTAL | Status: DC | PRN

## 2022-10-03 MED ORDER — HEPARIN BOLUS VIA INFUSION
4000.0000 [IU] | Freq: Once | INTRAVENOUS | Status: AC
  Administered 2022-10-03: 4000 [IU] via INTRAVENOUS
  Filled 2022-10-03: qty 4000

## 2022-10-03 MED ORDER — SODIUM CHLORIDE 0.9 % IV SOLN
250.0000 mL | INTRAVENOUS | Status: DC
  Administered 2022-10-03: 250 mL via INTRAVENOUS

## 2022-10-03 MED ORDER — POLYETHYLENE GLYCOL 3350 17 G PO PACK: 17.0000 g | PACK | Freq: Every day | ORAL | Status: DC | PRN

## 2022-10-03 MED ORDER — DOCUSATE SODIUM 100 MG PO CAPS: 100.0000 mg | ORAL_CAPSULE | Freq: Two times a day (BID) | ORAL | Status: DC | PRN

## 2022-10-03 MED ORDER — ONDANSETRON HCL 4 MG PO TABS: 4.0000 mg | ORAL_TABLET | Freq: Four times a day (QID) | ORAL | Status: DC | PRN

## 2022-10-03 MED ORDER — METOPROLOL TARTRATE 5 MG/5ML IV SOLN
5.0000 mg | INTRAVENOUS | Status: AC
  Administered 2022-10-03: 5 mg via INTRAVENOUS
  Filled 2022-10-03: qty 5

## 2022-10-03 MED ORDER — ONDANSETRON HCL 4 MG/2ML IJ SOLN
4.0000 mg | Freq: Four times a day (QID) | INTRAMUSCULAR | Status: DC | PRN
  Administered 2022-10-06: 4 mg via INTRAVENOUS
  Filled 2022-10-03: qty 2

## 2022-10-03 MED ORDER — SODIUM CHLORIDE 0.45 % IV SOLN: INTRAVENOUS | Status: DC

## 2022-10-03 MED ORDER — ALBUTEROL SULFATE (2.5 MG/3ML) 0.083% IN NEBU: 2.5000 mg | INHALATION_SOLUTION | RESPIRATORY_TRACT | Status: DC | PRN

## 2022-10-03 MED ORDER — POTASSIUM CHLORIDE 10 MEQ/100ML IV SOLN
10.0000 meq | INTRAVENOUS | Status: AC
  Administered 2022-10-03 (×2): 10 meq via INTRAVENOUS
  Filled 2022-10-03 (×2): qty 100

## 2022-10-03 MED ORDER — SODIUM CHLORIDE 0.9 % IV SOLN
500.0000 mg | INTRAVENOUS | Status: DC
  Administered 2022-10-03 – 2022-10-05 (×3): 500 mg via INTRAVENOUS
  Filled 2022-10-03 (×3): qty 5

## 2022-10-03 MED ORDER — FENTANYL CITRATE PF 50 MCG/ML IJ SOSY
50.0000 ug | PREFILLED_SYRINGE | INTRAMUSCULAR | Status: DC | PRN
  Administered 2022-10-03: 100 ug via INTRAVENOUS
  Administered 2022-10-03: 200 ug via INTRAVENOUS
  Administered 2022-10-03: 150 ug via INTRAVENOUS
  Administered 2022-10-03 (×2): 50 ug via INTRAVENOUS
  Administered 2022-10-04: 200 ug via INTRAVENOUS
  Administered 2022-10-04: 50 ug via INTRAVENOUS
  Administered 2022-10-04 (×7): 200 ug via INTRAVENOUS
  Administered 2022-10-05 (×2): 100 ug via INTRAVENOUS
  Administered 2022-10-05: 200 ug via INTRAVENOUS
  Administered 2022-10-05: 100 ug via INTRAVENOUS
  Administered 2022-10-05 (×2): 200 ug via INTRAVENOUS
  Administered 2022-10-05: 50 ug via INTRAVENOUS
  Administered 2022-10-05: 200 ug via INTRAVENOUS
  Administered 2022-10-06: 100 ug via INTRAVENOUS
  Administered 2022-10-06: 50 ug via INTRAVENOUS
  Filled 2022-10-03 (×5): qty 4
  Filled 2022-10-03: qty 1
  Filled 2022-10-03 (×3): qty 4
  Filled 2022-10-03: qty 3
  Filled 2022-10-03 (×5): qty 4
  Filled 2022-10-03: qty 2
  Filled 2022-10-03 (×6): qty 4
  Filled 2022-10-03: qty 2

## 2022-10-03 MED ORDER — DOCUSATE SODIUM 50 MG/5ML PO LIQD
100.0000 mg | Freq: Two times a day (BID) | ORAL | Status: DC
  Administered 2022-10-03 – 2022-10-05 (×6): 100 mg
  Filled 2022-10-03 (×6): qty 10

## 2022-10-03 NOTE — Progress Notes (Signed)
Family friend has taken patient's glasses, clothes and shoes home. Sister aware and at bedside.

## 2022-10-03 NOTE — Progress Notes (Signed)
ETT advanced 2cm per MD order. Was originally at 24 cm at teeth and now at 26 cm. No complications. Stable vitals throughout.

## 2022-10-03 NOTE — H&P (Signed)
History and Physical    Craig Cole:[id number] DOB: June 19, 1961 DOA: 09/24/2022  PCP: Benetta Spar, MD  Patient coming from: home  I have personally briefly reviewed patient's old medical records in [facility]  Chief Complaint: afib rvr   HPI: Craig Cole is a 62 y.o. male with medical history significant of Asthma, GERD, HLD, HTN ,metastatic pancreatic cancer with liver mets followed by Dr Ellin Saba, OSA on cpap, recent dx of  Afib on last admission  2 weeks go not on [name] [place], patient present to ED with  afib rvr from radiology where patient was being prepped for port placement for planned chemo. Patient at time of interview is restless, note able to give further history. Patient has been given ativan due to episode of agitation. Unable to obtain further history.   ED Course:  Vitals: afeb, bp 129/77, hr 101, rr 16 sat 93% on ra  EKG: afib LBBB 111 Wbc 41.6, hgb 13, plt 193,  Na 144, K 2.4, cl 122, co218, cr 0.54 ast 52, alkphos 207  CE 15 ,11 Bnp 88.1 UA neg  PIR:JJOACZYSAY right pleural effusion and progressive volume loss at the right lung base.    CTA MPRESSION: 1. Small bilateral nonocclusive pulmonary emboli, new from prior exam. No right heart strain. 2. Small right pleural effusion has increased from prior CT. Increasing atelectasis in the right lower lobe. Increasing right middle lobe atelectasis related to elevated right hemidiaphragm. 3. Coronary artery calcifications. Tx ns 500cc ,morphine,cardizem drip   CTAB 1. Known hypodense mass in the pancreatic tail has slightly increased in size from prior exam, with mild adjacent fat stranding. 2. Progressive hepatic metastatic disease. 3. Upper abdominal adenopathy. 4. Small volume upper abdominal and pelvic ascites, new. 5. Hepatomegaly and splenomegaly again seen. 6. 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 7. Nonobstructing left  nephrolithiasis.   Patient course in ed complicate by agitation , patient was given  Ativan ,as well as haldol  Review of Systems: As per HPI otherwise 10 point review of systems negative.   Past Medical History:  Diagnosis Date   Cancer [facility])    skin cancer   GERD (gastroesophageal reflux disease)    Glaucoma    High risk sexual behavior    on PrEP for HIV   Hyperlipidemia    Hypertension    Sleep apnea     Past Surgical History:  Procedure Laterality Date   COLONOSCOPY  05/25/2012   [facility]; rare diverticulum.   COLONOSCOPY WITH PROPOFOL N/A 04/28/2021   Procedure: COLONOSCOPY WITH PROPOFOL;  Surgeon: [name], MD;  Location: [facility];  Service: Endoscopy;  Laterality: N/A;  8:45am   POLYPECTOMY  04/28/2021   Procedure: POLYPECTOMY;  Surgeon: [name], MD;  Location: [facility];  Service: Endoscopy;;   RETINAL DETACHMENT SURGERY Left    TONSILLECTOMY       reports that he quit smoking about 25 years ago. His smoking use included cigarettes. He smoked an average of 1 pack per day. He has never used smokeless tobacco. He reports that he does not currently use alcohol. He reports that he does not use drugs.  No Known Allergies  Family History  Problem Relation Age of Onset   Hypertension Mother    Diabetes Mother    Myasthenia gravis Father    Prostate cancer Brother    Colon cancer Neg Hx     Prior to Admission  medications   Medication Sig Start Date End Date Taking? Authorizing Provider  aspirin EC 81 MG tablet Take 81 mg by mouth every evening.    [provider]  atorvastatin (LIPITOR) 20 MG tablet Take 1 tablet (20 mg total) by mouth daily. 09/20/22   Johnson, Clanford L, MD  dextrose 5 % SOLN 1,000 mL with fluorouracil 5 GM/100ML SOLN Inject into the vein.    [provider]  diltiazem (CARDIZEM CD) 300 MG 24 hr capsule Take 1 capsule (300 mg total) by mouth daily. 09/20/22   Johnson,  Clanford L, MD  emtricitabine-tenofovir AF (DESCOVY) 200-25 MG tablet Take 1 tablet by mouth daily. 09/01/22   Kuppelweiser, Cassie L, RPH-CPP  esomeprazole (NEXIUM) 40 MG capsule Take 40 mg by mouth every morning. 05/31/19   [provider]  fluticasone (FLONASE) 50 MCG/ACT nasal spray Place 2 sprays into both nostrils daily for 14 days. 09/20/22 10/04/22  Johnson, Clanford L, MD  furosemide (LASIX) 20 MG tablet TAKE 1 TABLET BY MOUTH DAILY AS NEEDED FOR LOWER LEG SWELLING 06/02/21   [provider]  hydrALAZINE (APRESOLINE) 25 MG tablet Take 25 mg by mouth 2 (two) times daily. 09/22/22   [provider]  IRINOTECAN HCL IV Inject into the vein.    [provider]  LEUCOVORIN CALCIUM IV Inject into the vein.    [provider]  lidocaine-prilocaine (EMLA) cream Apply to affected area once 09/30/22   Doreatha Massed, MD  Metoprolol Tartrate 75 MG TABS Take 1 tablet (75 mg total) by mouth 2 (two) times daily. 09/19/22   Johnson, Clanford L, MD  montelukast (SINGULAIR) 10 MG tablet Take 10 mg by mouth at bedtime. 05/31/19   [provider]  Multiple Vitamins-Minerals (ZINC PO) Take 1 tablet by mouth daily.    [provider]  ondansetron (ZOFRAN-ODT) 4 MG disintegrating tablet Take 1 tablet (4 mg total) by mouth every 8 (eight) hours as needed for nausea or vomiting. 09/06/22   Leath-Warren, Sadie Haber, NP  OXALIPLATIN IV Inject into the vein.    [provider]  prochlorperazine (COMPAZINE) 10 MG tablet Take 1 tablet (10 mg total) by mouth every 6 (six) hours as needed for nausea or vomiting (Nausea or vomiting). 09/30/22   Doreatha Massed, MD  promethazine-dextromethorphan (PROMETHAZINE-DM) 6.25-15 MG/5ML syrup Take 5 mLs by mouth at bedtime as needed for cough. Do not take with alcohol or while driving or operating heavy machinery.  May cause drowsiness. 08/26/22   Valentino Nose, NP  tadalafil (CIALIS) 5 MG tablet Take 5 mg by  mouth in the morning. 09/27/20   [provider]  Tafluprost, PF, 0.0015 % SOLN Place 1 drop into both eyes at bedtime.    [provider]  tamsulosin (FLOMAX) 0.4 MG CAPS capsule Take 0.4 mg by mouth at bedtime. 05/31/19   [provider]    Physical Exam: Vitals:   2022-10-29 2230 10/29/22 2345 10/03/22 0000 10/03/22 0030  BP: 122/82 131/87 119/84 (!) 115/97  Pulse: (!) 106 (!) 103 (!) 112 95  Resp: (!) 31 (!) 29 (!) 27 (!) 24  Temp:      TempSrc:      SpO2: 96% 96% 95% 96%  Weight:      Height:        Constitutional: appears restless  Vitals:   2022/10/29 2230 10-29-2022 2345 10/03/22 0000 10/03/22 0030  BP: 122/82 131/87 119/84 (!) 115/97  Pulse: (!) 106 (!) 103 (!) 112 95  Resp: Marland Kitchen)  31 (!) 29 (!) 27 (!) 24  Temp:      TempSrc:      SpO2: 96% 96% 95% 96%  Weight:      Height:       Eyes: PERRL, lids and conjunctivae normal ENMT: Mucous membranes are dry. Posterior pharynx clear of any exudate or lesions. Poor dentition.  Neck: normal, supple, no masses, no thyromegaly Respiratory: clear to auscultation bilaterally, no wheezing, no crackles. Normal respiratory effort. No accessory muscle use.  Cardiovascular: Regular rate and rhythm, no murmurs / rubs / gallops. No extremity edema. 2+ pedal pulses.   Abdomen: no tenderness, no masses palpated. No hepatosplenomegaly. Bowel sounds positive.  Musculoskeletal: no clubbing / cyanosis. No joint deformity upper and lower extremities. Good ROM, no contractures. Normal muscle tone.  Skin: no rashes, lesions, ulcers. No induration Neurologic: CN 2-12 grossly intact. Sensation intact, MAE  Psychiatric: Normal judgment and insight. Alert and oriented x 3. Normal mood.    Labs on Admission: I have personally reviewed following labs and imaging studies  CBC: Recent Labs  Lab 09/27/2022 1455  WBC 41.6*  NEUTROABS 34.8*  HGB 13.1  HCT 42.0  MCV 86.6  PLT 193   Basic Metabolic Panel: Recent Labs  Lab  09/27/2022 1705  NA 144  K 2.4*  CL 122*  CO2 18*  GLUCOSE 87  BUN 28*  CREATININE 0.54*  CALCIUM 8.7*   GFR: Estimated Creatinine Clearance: 161.6 mL/min (A) (by C-G formula based on SCr of 0.54 mg/dL (L)). Liver Function Tests: Recent Labs  Lab 10/07/2022 1705  AST 52*  ALT 35  ALKPHOS 207*  BILITOT 2.1*  PROT 3.7*  ALBUMIN <1.5*   No results for input(s): "LIPASE", "AMYLASE" in the last 168 hours. No results for input(s): "AMMONIA" in the last 168 hours. Coagulation Profile: Recent Labs  Lab 10/20/2022 1705  INR 1.4*   Cardiac Enzymes: No results for input(s): "CKTOTAL", "CKMB", "CKMBINDEX", "TROPONINI" in the last 168 hours. BNP (last 3 results) No results for input(s): "PROBNP" in the last 8760 hours. HbA1C: No results for input(s): "HGBA1C" in the last 72 hours. CBG: Recent Labs  Lab 10/17/2022 1235  GLUCAP 127*   Lipid Profile: No results for input(s): "CHOL", "HDL", "LDLCALC", "TRIG", "CHOLHDL", "LDLDIRECT" in the last 72 hours. Thyroid Function Tests: No results for input(s): "TSH", "T4TOTAL", "FREET4", "T3FREE", "THYROIDAB" in the last 72 hours. Anemia Panel: No results for input(s): "VITAMINB12", "FOLATE", "FERRITIN", "TIBC", "IRON", "RETICCTPCT" in the last 72 hours. Urine analysis:    Component Value Date/Time   COLORURINE AMBER (A) 10/05/2022 1551   APPEARANCEUR CLEAR 10/09/2022 1551   LABSPEC 1.025 10/17/2022 1551   PHURINE 5.0 09/24/2022 1551   GLUCOSEU NEGATIVE 09/29/2022 1551   HGBUR NEGATIVE 10/14/2022 1551   BILIRUBINUR NEGATIVE 10/09/2022 1551   KETONESUR NEGATIVE 10/20/2022 1551   PROTEINUR NEGATIVE 10/13/2022 1551   NITRITE NEGATIVE 10/05/2022 1551   LEUKOCYTESUR NEGATIVE 09/29/2022 1551    Radiological Exams on Admission: CT Abdomen Pelvis W Contrast  Result Date: 10/17/2022 CLINICAL DATA:  Acute nonlocalized abdominal pain. Diagnosis of pancreatic mass with liver metastasis. EXAM: CT ABDOMEN AND PELVIS WITH CONTRAST TECHNIQUE:  Multidetector CT imaging of the abdomen and pelvis was performed using the standard protocol following bolus administration of intravenous contrast. RADIATION DOSE REDUCTION: This exam was performed according to the departmental dose-optimization program which includes automated exposure control, adjustment of the mA and/or kV according to patient size and/or use of iterative reconstruction technique. CONTRAST:  OMNIPAQUE IOHEXOL 350  MG/ML SOLN COMPARISON:  CT 09/15/2022 FINDINGS: Lower chest: Assessed on concurrent chest CT, reported separately. Hepatobiliary: Progressive enlarging hepatic metastatic disease. For example, right lobe liver lesion measures 5 x 4 cm, previously 4.7 x 4.2 cm. The liver is enlarged spanning 25.7 cm cranial caudal. No portal vein thrombosis. Unremarkable gallbladder. Pancreas: Hypodense lesion in the pancreatic tail measures approximately 6.3 x 5.1 cm, previously 6 x 5 cm. Motion artifact limits detailed assessment. There is mild adjacent fat stranding that is new from prior exam. No pancreatic ductal dilatation. Spleen: Enlarged spanning 13.8 cm cranial caudal. No discrete splenic lesion. Adrenals/Urinary Tract: No adrenal nodules. No hydronephrosis. Punctate nonobstructing left renal stone. Small right renal cyst, needing no further imaging follow-up. Minimally distended urinary bladder. No definitive bladder wall thickening. Stomach/Bowel: Nondistended stomach. No bowel obstruction or inflammation. Moderate stool in the distal colon. Normal appendix. Vascular/Lymphatic: Aortic atherosclerosis and tortuosity. No aneurysm. The portal vein remains patent. 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 There is a 14 mm porta hepatis node, series 2, image 40. Additional prominent nodes adjacent to the porta hepatis and SMV. There is a 15 mm peripancreatic node centrally, series 2, image 42. Reproductive: TURP defect in the prostate. Other:  Trace perihepatic and perisplenic ascites. Small amount of free fluid in the pelvis dependently. This is new from prior. No mesenteric thickening. Musculoskeletal: Scoliosis and degenerative change in the spine. Bilateral hip osteoarthritis. No focal bone lesion or acute osseous findings. IMPRESSION: 1. Known hypodense mass in the pancreatic tail has slightly increased in size from prior exam, with mild adjacent fat stranding. 2. Progressive hepatic metastatic disease. 3. Upper abdominal adenopathy. 4. Small volume upper abdominal and pelvic ascites, new. 5. Hepatomegaly and splenomegaly again seen. 6. 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 7. Nonobstructing left nephrolithiasis. Aortic Atherosclerosis (ICD10-I70.0). Electronically Signed   By: Narda Rutherford M.D.   On: 10/15/2022 21:55   CT Angio Chest PE W/Cm &/Or Wo Cm  Result Date: 10/17/2022 CLINICAL DATA:  Pulmonary embolism (PE) suspected, low to intermediate prob, positive D-dimer EXAM: CT ANGIOGRAPHY CHEST WITH CONTRAST TECHNIQUE: Multidetector CT imaging of the chest was performed using the standard protocol during bolus administration of intravenous contrast. Multiplanar CT image reconstructions and MIPs were obtained to evaluate the vascular anatomy. RADIATION DOSE REDUCTION: This exam was performed according to the departmental dose-optimization program which includes automated exposure control, adjustment of the mA and/or kV according to patient size and/or use of iterative reconstruction technique. CONTRAST:  OMNIPAQUE IOHEXOL 350 MG/ML SOLN COMPARISON:  Chest radiograph earlier today. Chest CTA 09/15/2022 FINDINGS: Cardiovascular: Subsegmental filling defects within the right upper lobe pulmonary arteries, for example series 5, image 107, new from prior exam. Small nonocclusive filling defects within the right middle lobe, left upper lobe and lingular segmental pulmonary arteries, also new. No  right heart strain. Minimal aortic atherosclerosis. Heart is normal in size. There are coronary artery calcifications. Minimal pericardial effusion. Mediastinum/Nodes: No enlarged mediastinal or hilar lymph nodes. No esophageal wall thickening. No thyroid nodule. Lungs/Pleura: Small right pleural effusion is increased from prior CT. There is no definite pleural nodularity or enhancement. Increasing atelectasis in the right lower lobe. There is compressive atelectasis in the right middle lobe related to elevated hemidiaphragm. No evidence of pulmonary infarct. No pulmonary nodule. Breathing motion artifact limits detailed assessment. Upper Abdomen: Assessed on concurrent abdominopelvic CT, reported separately. Musculoskeletal: No focal bone lesion or acute  osseous findings. Review of the MIP images confirms the above findings. IMPRESSION: 1. Small bilateral nonocclusive pulmonary emboli, new from prior exam. No right heart strain. 2. Small right pleural effusion has increased from prior CT. Increasing atelectasis in the right lower lobe. Increasing right middle lobe atelectasis related to elevated right hemidiaphragm. 3. Coronary artery calcifications. Aortic Atherosclerosis (ICD10-I70.0). Critical Value/emergent results were called by telephone at the time of interpretation on 10/17/2022 at 9:47 pm to provider [facility] , who verbally acknowledged these results. Electronically Signed   By: Narda Rutherford M.D.   On: 09/23/2022 21:48   DG Chest Port 1 View  Result Date: 10/20/2022 CLINICAL DATA:  Shortness of breath. Patient with history of stage IV pancreatic cancer. EXAM: PORTABLE CHEST 1 VIEW COMPARISON:  Chest radiograph 08/24/2022, CT 09/15/2022 FINDINGS: Increasing right pleural effusion and progressive volume loss at the right lung base. Associated basilar airspace disease. Heart is grossly stable in size allowing for portable technique. No pulmonary edema or pneumothorax. On limited assessment, no  acute osseous findings. IMPRESSION: Increasing right pleural effusion and progressive volume loss at the right lung base. Electronically Signed   By: Narda Rutherford M.D.   On: 09/27/2022 16:54    EKG: Independently reviewed. See above  Assessment/Plan  Afib rvr -admit to progressive care  -resume cardizem /metoprolol -wean cardizem drip  -pre hospital was asa based on low CHA2DS2-VASc- of 1    B/l Pulmonary Emboli - continue heparin drip -echo in am to be complete    Metastatic pancreatic cancer to liver  -now associated with PE  -Oncology to follow in am Dr Ellin Saba -will need to have port placed once stable   Leukocytosis  -thought to be related to stress response/malignancy  No fever, no source of infection noted   Acute Metabolic encephalopathy -multifactorial, medication /acute illness  -ammonia level pending  -abg -no hypercarbia noted   Asthma -resume home inhalers  -resume singulair  Hypokalemia -replete prn    GERD -ppi    HLD -resume home regimen  simvastatin   Essential hypertension  - increased dose of diltiazem CD to 300 mg  - increased dose of metoprolol to 75 mg BID   High risk sexual behavior -- he is on PREP therapy now per his PCP    OSA - resume nightly CPAP therapy       DVT prophylaxis: lovenox  full dose  Code Status:. full Family Communication: none at bedside Disposition Plan: patient  expected to be admitted greater than 2 midnights  Consults called: oncology DR K , consider cardiology if not  weaned of ccb drip by am  Admission status: progressive    [name] MD [facility]   If 7PM-7AM, please contact night-coverage [email or web address] Password [facility]  10/03/2022, 2:48 AM

## 2022-10-03 NOTE — ED Notes (Signed)
Charge called to room when pt became hypoxic and tachycardic. Pt HR 160s- currently on cardizem. Hypoxic 75%, placed on [place] with little effect. NRB placed on pt, pt up to 90%. EDP M Trifan called to bedside. Hospitalist paged.

## 2022-10-03 NOTE — Consult Note (Signed)
NAME:  Joven Mom, MRN:  [id number], DOB:  1960-09-07, LOS: 0 ADMISSION DATE:  10/08/2022, CONSULTATION DATE: 10/03/2022 REFERRING MD: Dr. [place], CHIEF COMPLAINT: Respiratory failure  History of Present Illness:  Patient with a history of stage IV metastatic pancreatic cancer with liver mets followed by Dr. Ellin Saba who was scheduled for port placement next, noted to be in A-fib with RVR.  Referred to the hospital for further evaluation and management Found to have pulmonary embolism for which she was started on anticoagulation Oxygen requirement worsening, agitation, altered mental status Background history of asthma, GERD, hypertension, hyperlipidemia Diagnosed about 2 weeks ago with A-fib  For his pancreatic cancer, has not started therapy yet but was scheduled for therapy following port placement  Spoke with patient and his sister He was feeling relatively well may be slower than his usual prior to showing up for port placement Did not have specific complaints  Pertinent  Medical History   Past Medical History:  Diagnosis Date   Cancer (HCC)    skin cancer   GERD (gastroesophageal reflux disease)    Glaucoma    High risk sexual behavior    on PrEP for HIV   Hyperlipidemia    Hypertension    Sleep apnea    Significant Hospital Events: Including procedures, antibiotic start and stop dates in addition to other pertinent events   4/13 CT head with no significant intracranial abnormality, 1.1 cm calcified extra-axial mass felt to be related to small calcified meningioma 4/12-progressive hepatic metastatic disease, small ascites, 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 rate (!) 25, height  (2.108 m), weight (!) 148 kg, SpO2 92 %.        Intake/Output Summary (Last 24 hours) at 10/03/2022 1041 Last data filed at 10/17/2022 2152 Gross per 24 hour  Intake 688.81 ml  Output --  Net 688.81 ml   Filed Weights   10/01/2022 1438  Weight: (!) 148 kg   Examination: General: Middle-age, increased work of breathing, respirate rate in the 30s and 40s HENT: Dry oral mucosa on nonrebreather mask Lungs: Decreased air entry at the bases bilaterally Cardiovascular: S1-S2 appreciated Abdomen: Soft, bowel sounds appreciated Extremities: No clubbing, no edema Neuro: Arouses to answer questions, follows commands GU:   Resolved Hospital Problem list     Assessment & Plan:  Atrial fibrillation with RVR New pulmonary embolism Acute hypoxemic respiratory failure -On anticoagulation -On Cardizem drip -Heart rate still uncontrolled, will need to start amiodarone  Hypoxemic respiratory failure With altered mental status, on nonrebreather mask and daily able to maintain sats of 90 Markedly increased work of breathing -Will intubate and ventilate -mechanical ventilation -Target TVol 6-8cc/kgIBW -Target Plateau Pressure < 30cm H20 -Target driving pressure less than 15 cm of water -target PaO2 55-65: titrate PEEP/FiO2 per protocol -Ventilator associated pneumonia prevention protocol  Atrial fibrillation Pulmonary embolism -Continue anticoagulation  Metastatic pancreatic cancer -Yet to start therapy -Appears to be aggressively progressive disease  Right pleural effusion Atelectasis -Will monitor at present -May be related to PE  Leukocytosis -Unclear etiology -Urine not  showing any signs of infection -Did not have a cough or respiratory complaints prior  to coming into the hospital -Questionable related to his neoplastic process -Will follow-up on cultures  Acute metabolic encephalopathy -Multifactorial -Ammonia level is pending  History of asthma -Bronchodilators  Hypokalemia -Will replete  On Descovy for high risk sexual behavior, on PREP therapy, HIV testing negative  Obstructive sleep apnea on CPAP therapy at home  Best Practice (right click and "Reselect all SmartList Selections" daily)   Diet/type: NPO DVT prophylaxis: systemic heparin GI prophylaxis: PPI Lines: N/A Foley:  N/A Code Status:  full code Last date of multidisciplinary goals of care discussion [discussed with sister on the phone]  Labs   CBC: Recent Labs  Lab 10/01/2022 1455 10/03/22 0600  WBC 41.6* 48.7*  NEUTROABS 34.8* 39.1*  HGB 13.1 12.9*  HCT 42.0 42.6  MCV 86.6 87.8  PLT 193 180    Basic Metabolic Panel: Recent Labs  Lab 09/26/2022 1705  NA 144  K 2.4*  CL 122*  CO2 18*  GLUCOSE 87  BUN 28*  CREATININE 0.54*  CALCIUM 8.7*   GFR: Estimated Creatinine Clearance: 161.6 mL/min (A) (by C-G formula based on SCr of 0.54 mg/dL (L)). Recent Labs  Lab 09/29/2022 1455 10/20/2022 2035 10/03/22 0600  WBC 41.6*  --  48.7*  LATICACIDVEN  --  1.8  --     Liver Function Tests: Recent Labs  Lab 10/03/2022 1705  AST 52*  ALT 35  ALKPHOS 207*  BILITOT 2.1*  PROT 3.7*  ALBUMIN <1.5*   No results for input(s): "LIPASE", "AMYLASE" in the last 168 hours. No results for input(s): "AMMONIA" in the last 168 hours.  ABG    Component Value Date/Time   HCO3 30.6 (H) 10/03/2022 0936   O2SAT 85.3 10/03/2022 0936     Coagulation Profile: Recent Labs  Lab 09/23/2022 1705  INR 1.4*    Cardiac Enzymes: No results for input(s): "CKTOTAL", "CKMB", "CKMBINDEX", "TROPONINI" in the last 168 hours.  HbA1C: No results found for: "HGBA1C"  CBG: Recent Labs  Lab 09/29/2022 1235  GLUCAP 127*    Review of Systems:   Shortness of breath,  Past  Medical History:  He,  has a past medical history of Cancer (HCC), GERD (gastroesophageal reflux disease), Glaucoma, High risk sexual behavior, Hyperlipidemia, Hypertension, and Sleep apnea.   Surgical History:   Past Surgical History:  Procedure Laterality Date   COLONOSCOPY  05/25/2012   [facility]; rare diverticulum.   COLONOSCOPY WITH PROPOFOL N/A 04/28/2021   Procedure: COLONOSCOPY WITH PROPOFOL;  Surgeon: [name], MD;  Location: [facility];  Service: Endoscopy;  Laterality: N/A;  8:45am   POLYPECTOMY  04/28/2021   Procedure: POLYPECTOMY;  Surgeon: [name], MD;  Location: [facility];  Service: Endoscopy;;   RETINAL DETACHMENT SURGERY Left    TONSILLECTOMY       Social History:   reports that he quit smoking about 25 years ago. His smoking use included cigarettes. He smoked an average of 1 pack per day. He has never used smokeless tobacco. He reports that he does not currently use alcohol. He reports that he does not use drugs.   Family History:  His family history includes Diabetes in his mother; Hypertension in his mother; Myasthenia gravis in his father; Prostate cancer in his brother. There is no history of Colon cancer.   Allergies No Known Allergies   The patient is critically ill with multiple organ systems  failure and requires high complexity decision making for assessment and support, frequent evaluation and titration of therapies, application of advanced monitoring technologies and extensive interpretation of multiple databases. Critical Care Time devoted to patient care services described in this note independent of APP/resident time (if applicable)  is 40 minutes.   [name] MD [place] [facility] Personal pager: See Amion If unanswered, please page CCM On-call: #[phone]

## 2022-10-03 NOTE — Progress Notes (Signed)
Sputum collected and send to lab.

## 2022-10-03 NOTE — Progress Notes (Signed)
ANTICOAGULATION CONSULT NOTE -   Consult  Pharmacy Consult for Heparin Indication: pulmonary embolus  No Known Allergies  Patient Measurements: Height: 6\' 11"  (210.8 cm) Weight: (!) 148 kg (326 lb 4.5 oz) IBW/kg (Calculated) : 102.9 Heparin Dosing Weight: 134 kg  Vital Signs: Temp: 98.6 F (37 C) (04/13 0508) Temp Source: Oral (04/13 0508) BP: 114/77 (04/13 0508) Pulse Rate: 144 (04/13 0508)  Labs: Recent Labs    09/28/2022 1455 10/10/2022 1705 10/11/2022 1711 10/03/22 0600  HGB 13.1  --   --  12.9*  HCT 42.0  --   --  42.6  PLT 193  --   --  180  LABPROT  --  17.0*  --   --   INR  --  1.4*  --   --   HEPARINUNFRC  --   --   --  <0.10*  CREATININE  --  0.54*  --   --   TROPONINIHS 15  --  11  --      Estimated Creatinine Clearance: 161.6 mL/min (A) (by C-G formula based on SCr of 0.54 mg/dL (L)).   Medical History: Past Medical History:  Diagnosis Date   Cancer (HCC)    skin cancer   GERD (gastroesophageal reflux disease)    Glaucoma    High risk sexual behavior    on PrEP for HIV   Hyperlipidemia    Hypertension    Sleep apnea     Medications:  No oral anticoagulation PTA  Assessment: 62 yr male with recently diagnosed AFib (no oral anticoagulation) admitted with new Pulmonary Embolism.  PMH significant for skin cancer with mets to pancreas and liver  Today, 10/03/22 HL is <0.10, subtherapeutic  Hgb 12.9, plt 180  SCr 0.54 mg/dl  No line or bleeding issues per RN    Goal of Therapy:  Heparin level 0.3-0.7 units/ml Monitor platelets by anticoagulation protocol: Yes   Plan:  Repeat Heparin 4000 unit IV bolus x 1 and increase Heparin gtt to 2600 units/hr Check heparin level 6 hr after heparin gtt started Daily heparin level & CBC   Adalberto Cole, PharmD, BCPS 10/03/2022 7:52 AM

## 2022-10-03 NOTE — Progress Notes (Signed)
ANTICOAGULATION CONSULT NOTE - Follow Up  Pharmacy Consult for Heparin Indication: pulmonary embolus  No Known Allergies  Patient Measurements: Height: 6\' 5"  (195.6 cm) (Per family) Weight: (!) 140.1 kg (308 lb 13.8 oz) IBW/kg (Calculated) : 89.1 Heparin Dosing Weight: 120 kg -Ht updated, resulting in change in heparin dosing weight -Bed weight obtained   Vital Signs: Temp: 97.3 F (36.3 C) (04/13 1319) Temp Source: Axillary (04/13 1319) BP: 84/62 (04/13 1440) Pulse Rate: 141 (04/13 1530)  Labs: Recent Labs    10/19/2022 1455 09/26/2022 1705 10/01/2022 1711 10/03/22 0600 10/03/22 0940 10/03/22 0955 10/03/22 1013  HGB 13.1  --   --  12.9*  --   --   --   HCT 42.0  --   --  42.6  --   --   --   PLT 193  --   --  180  --   --   --   LABPROT  --  17.0*  --   --   --   --   --   INR  --  1.4*  --   --   --   --   --   HEPARINUNFRC  --   --   --  <0.10*  --  <0.10*  --   CREATININE  --  0.54*  --   --   --   --  0.77  TROPONINIHS 15  --  11  --  15  --   --     Estimated Creatinine Clearance: 146.4 mL/min (by C-G formula based on SCr of 0.77 mg/dL).   Medical History: Past Medical History:  Diagnosis Date   Cancer (HCC)    skin cancer   GERD (gastroesophageal reflux disease)    Glaucoma    High risk sexual behavior    on PrEP for HIV   Hyperlipidemia    Hypertension    Sleep apnea     Medications: No oral anticoagulation PTA  Assessment: Pt is a 64 yoM with recent diagnosis of pancreatic cancer with mets to the liver. Pt recently diagnosed with atrial fibrillation, not on anticoagulant PTA. Pt was scheduled for port placement on 4/12 [facility] placement delayed and pt sent to ED for afib with RVR.   CTA: Small bilateral nonocclusive PE, no right heart strain. Pharmacy consulted to dose heparin for PE and atrial fibrillation. Pt intubated 4/13.   Today, 10/03/22 Heparin level = 0.28 has increased, but remains slightly subtherapeutic on heparin infusion of 2600  units/hr Level obtained at 0955 (<0.1) was drawn too early. Erroneous. CBC: Hgb slightly low, Plt WNL SCr WNL Confirmed with RN - no interruptions/pauses or line issues. No signs of bleeding.   Goal of Therapy:  Heparin level 0.3-0.7 units/ml Monitor platelets by anticoagulation protocol: Yes   Plan:  Heparin bolus of 2000 units IV once Increase heparin infusion to 2800 units/hr Check heparin level 6 hours after rate increase CBC, heparin level daily while on heparin infusion Monitor for signs of bleeding  Cindi Carbon, PharmD 10/03/22 4:14 PM

## 2022-10-03 NOTE — Progress Notes (Signed)
Lab called and post intubation ABG was sent.

## 2022-10-03 NOTE — Procedures (Signed)
Orogastric tube placed uneventfully 

## 2022-10-03 NOTE — Progress Notes (Addendum)
With his leukocytosis, though was not having significant symptoms prior to his scheduled procedure  Empirically start on antibiotics for possible community-acquired pneumonia  He does have an elevated right hemidiaphragm, atelectasis  Continues to require pressors  Elevated lactate

## 2022-10-03 NOTE — Procedures (Signed)
Central Venous Catheter Insertion Procedure Note  Sammuel Kichline  [id number]  02/07/1961  Date:10/03/22  Time:2:22 PM   Provider Performing:Jordanny Waddington A Maritsa Hunsucker   Procedure: Insertion of Non-tunneled Central Venous [phone]) with US guidance (07371)   Indication(s) Medication administration  Consent Risks of the procedure as well as the alternatives and risks of each were explained to the patient and/or caregiver.  Consent for the procedure was obtained and is signed in the bedside chart  Anesthesia Topical only with 1% lidocaine   Timeout Verified patient identification, verified procedure, site/side was marked, verified correct patient position, special equipment/implants available, medications/allergies/relevant history reviewed, required imaging and test results available.  Sterile Technique Maximal sterile technique including full sterile barrier drape, hand hygiene, sterile gown, sterile gloves, mask, hair covering, sterile ultrasound probe cover (if used).  Procedure Description Area of catheter insertion was cleaned with chlorhexidine and draped in sterile fashion.  With real-time ultrasound guidance a central venous catheter was placed into the right internal jugular vein. Nonpulsatile blood flow and easy flushing noted in all ports.  The catheter was sutured in place and sterile dressing applied.  Complications/Tolerance None; patient tolerated the procedure well. Chest X-ray is ordered to verify placement for internal jugular or subclavian cannulation.   Chest x-ray is not ordered for femoral cannulation.  EBL Minimal  Specimen(s) None

## 2022-10-03 NOTE — ED Provider Notes (Signed)
I was called into the room by the staff as the patient became tachycardic, and abruptly hypoxic, the patient was in A-fib with RVR and heart rate of approximate 150s despite being on the diltiazem infusion, and became hypoxic down into the 70s on room air, which was improved back to 95% on a nonrebreather mask.  I have asked for repeat EKG, troponin, chest x-ray venous gas, and I also consulted with the hospitalist about already admitted the patient, and I recommended an ICU or critical care evaluation, which the hospitalist reported he would contact Critical care.  Patient is already on heparin for bilateral PE. There would be concern about potential worsening PE burden with his sudden shift in vital signs.  BP remains normotensive at this time.   Terald Sleeper, MD 10/03/22 [phone]

## 2022-10-03 NOTE — Procedures (Signed)
Intubation Procedure Note  Craig Cole  [id number]  [birth year]  Date:10/03/22  Time:11:31 AM   Provider Performing:Hargis Vandyne A Kenitha Glendinning    Procedure: Intubation (31500)  Indication(s) Respiratory Failure  Consent Unable to obtain consent due to emergent nature of procedure. I did make sister aware that he will need to be on a ventilator when I discussed patient with her this morning  Anesthesia Etomidate, Fentanyl, and Rocuronium 20 of etomidate, 50 fentanyl, 50 rocuronium  Time Out Verified patient identification, verified procedure, site/side was marked, verified correct patient position, special equipment/implants available, medications/allergies/relevant history reviewed, required imaging and test results available.   Sterile Technique Usual hand hygeine, masks, and gloves were used   Procedure Description Patient positioned in bed supine.  Sedation given as noted above.  Patient was intubated with endotracheal tube using Glidescope.  View was Grade 1 full glottis .  Number of attempts was 1.  Colorimetric CO2 detector was consistent with tracheal placement.   Complications/Tolerance None; patient tolerated the procedure well. Chest X-ray is ordered to verify placement.   EBL Minimal   Specimen(s) None

## 2022-10-03 NOTE — Progress Notes (Signed)
SLP Cancellation Note  Patient Details Name: Craig Cole MRN: [id number] DOB: Jan 14, 1961   Cancelled treatment:       Reason Eval/Treat Not Completed: Medical issues which prohibited therapy.  Now intubated. Please re-consult when appropriate.   Ferdinand Lango MA, CCC-SLP    Liliyana Thobe Meryl 10/03/2022, 12:55 PM

## 2022-10-03 NOTE — Progress Notes (Signed)
eLink Physician-Brief Progress Note Patient Name: Craig Cole DOB: 23-Dec-1960 MRN: [id number]   Date of Service  10/03/2022  HPI/Events of Note  Camera: On vent.   RN asking for restraints for agitation, interference with treatment  eICU Interventions  Bilateral soft wrist, non violent restraints ordered to prevent self injury and harm.      Intervention Category Minor Interventions: Agitation / anxiety - evaluation and management;Other:  Ranee Gosselin 10/03/2022, 7:46 PM  04:53 AHRF.  On rocephin and azithr for CAP, suspected.   PE A fib RVR Metastatic pancreatic cancer with liver mets.   Increasing wbc count to 50 K, febrile at 102. On Vent.   - blood culture has been sent. - will upgrade abx from rocephin to Vanc, cefepime.follow blood cultures.  Discussed with RN. Pharmacy to dose above abx.

## 2022-10-03 NOTE — Progress Notes (Signed)
Sputum container in line. Patient did not have any sputum at this time. Will continue to try and when we have a sample will send to lab.

## 2022-10-03 NOTE — ED Notes (Signed)
ED TO INPATIENT HANDOFF REPORT  ED Nurse Name and Phone #: Arnetha Gula Name/Age/Gender Craig Cole 62 y.o. male Room/Bed: WA23/WA23  Code Status   Code Status: Full Code  Home/SNF/Other Home Patient oriented to: self Is this baseline? Yes   Triage Complete: Triage complete  Chief Complaint Atrial fibrillation with RVR [I48.91] Acute hypoxemic respiratory failure [J96.01]  Triage Note Patient brought over from radiology. Was scheduled to have a port placed today. Has stage 4 pancreatic cancer. Heart rate was in 140s. Recently diagnosed with a fib 2 weeks ago. Has not begun chemotherapy yet.   Allergies No Known Allergies  Level of Care/Admitting Diagnosis ED Disposition     ED Disposition  Admit   Condition  --   Comment  Hospital Area: [facility] [[id number]]  Level of Care: ICU [6]  May admit patient to [facility] if equivalent level of care is available:: Yes  Covid Evaluation: Confirmed COVID Negative  Diagnosis: Acute hypoxemic respiratory failure [[id number]]  Admitting Physician: Tomma Lightning [[id number]]  Attending Physician: Tomma Lightning [[id number]]  Certification:: I certify this patient will need inpatient services for at least 2 midnights          B Medical/Surgery History Past Medical History:  Diagnosis Date   Cancer (HCC)    skin cancer   GERD (gastroesophageal reflux disease)    Glaucoma    High risk sexual behavior    on PrEP for HIV   Hyperlipidemia    Hypertension    Sleep apnea    Past Surgical History:  Procedure Laterality Date   COLONOSCOPY  05/25/2012   [facility]; rare diverticulum.   COLONOSCOPY WITH PROPOFOL N/A 04/28/2021   Procedure: COLONOSCOPY WITH PROPOFOL;  Surgeon: [name], MD;  Location: [facility];  Service: Endoscopy;  Laterality: N/A;  8:45am   POLYPECTOMY  04/28/2021   Procedure: POLYPECTOMY;  Surgeon: [name], MD;   Location: [facility];  Service: Endoscopy;;   RETINAL DETACHMENT SURGERY Left    TONSILLECTOMY       A IV Location/Drains/Wounds Patient Lines/Drains/Airways Status     Active Line/Drains/Airways     Name Placement date Placement time Site Days   Peripheral IV 09/21/2022 20 G 2.5" Right;Anterior Forearm 10/01/2022  2038  Forearm  1   Peripheral IV 10/03/22 20 G Left;Posterior Hand 10/03/22  0943  Hand  less than 1   Peripheral IV 10/03/22 20 G 1" Anterior;Left Wrist 10/03/22  0943  Wrist  less than 1   Airway 04/28/21  0800  -- 523   Airway 04/28/21  0800  -- 523            Intake/Output Last 24 hours  Intake/Output Summary (Last 24 hours) at 10/03/2022 1031 Last data filed at 10/01/2022 2152 Gross per 24 hour  Intake 688.81 ml  Output --  Net 688.81 ml    Labs/Imaging Results for orders placed or performed during the hospital encounter of 10/13/2022 (from the past 48 hour(s))  CBC with Differential     Status: Abnormal   Collection Time: 10/05/2022  2:55 PM  Result Value Ref Range   WBC 41.6 (H) 4.0 - 10.5 K/uL   RBC 4.85 4.22 - 5.81 MIL/uL   Hemoglobin 13.1 13.0 - 17.0 g/dL   HCT 46.9 62.9 - 52.8 %   MCV 86.6 80.0 - 100.0 fL   MCH 27.0 26.0 - 34.0 pg   MCHC  31.2 30.0 - 36.0 g/dL   RDW 33.8 (H) 25.0 - 53.9 %   Platelets 193 150 - 400 K/uL   nRBC 0.0 0.0 - 0.2 %   Neutrophils Relative % 84 %   Neutro Abs 34.8 (H) 1.7 - 7.7 K/uL   Lymphocytes Relative 3 %   Lymphs Abs 1.4 0.7 - 4.0 K/uL   Monocytes Relative 5 %   Monocytes Absolute 2.1 (H) 0.1 - 1.0 K/uL   Eosinophils Relative 6 %   Eosinophils Absolute 2.5 (H) 0.0 - 0.5 K/uL   Basophils Relative 0 %   Basophils Absolute 0.1 0.0 - 0.1 K/uL   Immature Granulocytes 2 %   Abs Immature Granulocytes 0.70 (H) 0.00 - 0.07 K/uL    Comment: Performed at [facility], [address], [place], [place] [address]  Troponin I (High Sensitivity)     Status: None   Collection Time: 10/16/2022  2:55 PM  Result  Value Ref Range   Troponin I (High Sensitivity) 15 <18 ng/L    Comment: Performed at [facility], [address] [place], [place] [address]  Brain natriuretic peptide     Status: None   Collection Time: 09/25/2022  2:55 PM  Result Value Ref Range   B Natriuretic Peptide 88.1 0.0 - 100.0 pg/mL    Comment: Performed at [facility], [address] [place], [place] [address]  Urinalysis, Routine w reflex microscopic -Urine, Clean Catch     Status: Abnormal   Collection Time: 09/24/2022  3:51 PM  Result Value Ref Range   Color, Urine AMBER (A) YELLOW    Comment: BIOCHEMICALS MAY BE AFFECTED BY COLOR   APPearance CLEAR CLEAR   Specific Gravity, Urine 1.025 1.005 - 1.030   pH 5.0 5.0 - 8.0   Glucose, UA NEGATIVE NEGATIVE mg/dL   Hgb urine dipstick NEGATIVE NEGATIVE   Bilirubin Urine NEGATIVE NEGATIVE   Ketones, ur NEGATIVE NEGATIVE mg/dL   Protein, ur NEGATIVE NEGATIVE mg/dL   Nitrite NEGATIVE NEGATIVE   Leukocytes,Ua NEGATIVE NEGATIVE    Comment: Performed at [facility], [address], [place], [place] [address]  Protime-INR     Status: Abnormal   Collection Time: 10/20/2022  5:05 PM  Result Value Ref Range   Prothrombin Time 17.0 (H) 11.4 - 15.2 seconds   INR 1.4 (H) 0.8 - 1.2    Comment: (NOTE) INR goal varies based on device and disease states. Performed at [facility], [address] [place], [place] [address]   Comprehensive metabolic panel     Status: Abnormal   Collection Time: 09/29/2022  5:05 PM  Result Value Ref Range   Sodium 144 135 - 145 mmol/L    Comment: ELECTROLYTES REPEATED TO VERIFY   Potassium 2.4 (LL) 3.5 - 5.1 mmol/L    Comment: ELECTROLYTES REPEATED TO VERIFY CRITICAL RESULT CALLED TO, READ BACK BY AND VERIFIED WITH KENT, S RN @1807  10/16/2022. GILBERTL    Chloride 122 (H) 98 - 111 mmol/L    Comment: ELECTROLYTES REPEATED TO VERIFY   CO2 18 (L) 22 - 32 mmol/L    Comment:  ELECTROLYTES REPEATED TO VERIFY   Glucose, Bld 87 70 - 99 mg/dL    Comment: Glucose reference range applies only to samples taken after fasting for at least 8 hours.   BUN 28 (H) 8 - 23 mg/dL   Creatinine, Ser 6.83 (L) 0.61 - 1.24 mg/dL   Calcium 8.7 (L) 8.9 -  10.3 mg/dL    Comment: ELECTROLYTES REPEATED TO VERIFY   Total Protein 3.7 (L) 6.5 - 8.1 g/dL   Albumin <1.6 (L) 3.5 - 5.0 g/dL   AST 52 (H) 15 - 41 U/L   ALT 35 0 - 44 U/L   Alkaline Phosphatase 207 (H) 38 - 126 U/L   Total Bilirubin 2.1 (H) 0.3 - 1.2 mg/dL   GFR, Estimated >10 >96 mL/min    Comment: (NOTE) Calculated using the CKD-EPI Creatinine Equation (2021)    Anion gap 4 (L) 5 - 15    Comment: ELECTROLYTES REPEATED TO VERIFY Performed at [facility], [address], [place], [place] [address]   Troponin I (High Sensitivity)     Status: None   Collection Time: 09/23/2022  5:11 PM  Result Value Ref Range   Troponin I (High Sensitivity) 11 <18 ng/L    Comment: (NOTE) Elevated high sensitivity troponin I (hsTnI) values and significant  changes across serial measurements may suggest ACS but many other  chronic and acute conditions are known to elevate hsTnI results.  Refer to the "Links" section for chest pain algorithms and additional  guidance. Performed at [facility], [address], [place], [place] [address]   Culture, blood (routine x 2)     Status: None (Preliminary result)   Collection Time: 10/15/2022  8:30 PM   Specimen: BLOOD  Result Value Ref Range   Specimen Description      BLOOD SITE NOT SPECIFIED Performed at [facility], [address] [place], [place] [address]    Special Requests      BOTTLES DRAWN AEROBIC AND ANAEROBIC Blood Culture adequate volume Performed at [facility], [address] [place] [address], [place], [place] [address]    Culture      NO GROWTH < 12 HOURS Performed at [facility], [address]  [place], [place] [address]    Report Status PENDING   Lactic acid, plasma     Status: None   Collection Time: 10/12/2022  8:35 PM  Result Value Ref Range   Lactic Acid, Venous 1.8 0.5 - 1.9 mmol/L    Comment: Performed at [facility], [address] [place], [place] [address]  Culture, blood (routine x 2)     Status: None (Preliminary result)   Collection Time: 10/07/2022  8:35 PM   Specimen: BLOOD  Result Value Ref Range   Specimen Description      BLOOD SITE NOT SPECIFIED Performed at [facility], [address], [place], [place] [address]    Special Requests      BOTTLES DRAWN AEROBIC AND ANAEROBIC Blood Culture adequate volume Performed at [facility], [address] [place], [place] [address]    Culture      NO GROWTH < 12 HOURS Performed at [facility], [address] [place], [place] [address]    Report Status PENDING   Heparin level (unfractionated)     Status: Abnormal   Collection Time: 10/03/22  6:00 AM  Result Value Ref Range   Heparin Unfractionated <0.10 (L) 0.30 - 0.70 IU/mL    Comment: (NOTE) The clinical reportable range upper limit is being lowered to >1.10 to align with the FDA approved guidance for the current laboratory assay.  If heparin results are below expected values, and patient dosage has  been confirmed, suggest follow up testing of antithrombin III levels. Performed at [facility], [address], [place], [place] [address]   CBC with Differential/Platelet     Status: Abnormal   Collection Time: 10/03/22  6:00 AM  Result Value Ref Range   WBC 48.7 (H) 4.0 - 10.5 K/uL   RBC 4.85 4.22 - 5.81 MIL/uL   Hemoglobin 12.9 (L) 13.0 - 17.0 g/dL   HCT 96.2 95.2 - 84.1 %   MCV 87.8 80.0 - 100.0 fL   MCH 26.6 26.0 - 34.0 pg   MCHC 30.3 30.0 - 36.0 g/dL   RDW 32.4 (H) 40.1 - 02.7 %   Platelets 180 150 - 400 K/uL   nRBC 0.0 0.0 - 0.2 %   Neutrophils Relative % 80 %    Neutro Abs 39.1 (H) 1.7 - 7.7 K/uL   Lymphocytes Relative 3 %   Lymphs Abs 1.4 0.7 - 4.0 K/uL   Monocytes Relative 5 %   Monocytes Absolute 2.4 (H) 0.1 - 1.0 K/uL   Eosinophils Relative 7 %   Eosinophils Absolute 3.4 (H) 0.0 - 0.5 K/uL   Basophils Relative 0 %   Basophils Absolute 0.2 (H) 0.0 - 0.1 K/uL   WBC Morphology Moderate Left Shift (>5% metas and myelos)    Immature Granulocytes 5 %   Abs Immature Granulocytes 2.20 (H) 0.00 - 0.07 K/uL   Burr Cells PRESENT    Polychromasia PRESENT    Ovalocytes PRESENT     Comment: Performed at [facility], [address], [place], [place] [address]  Blood gas, venous     Status: Abnormal   Collection Time: 10/03/22  6:00 AM  Result Value Ref Range   pH, Ven 7.44 (H) 7.25 - 7.43   pCO2, Ven 40 (L) 44 - 60 mmHg   pO2, Ven 69 (H) 32 - 45 mmHg   Bicarbonate 27.2 20.0 - 28.0 mmol/L   Acid-Base Excess 2.8 (H) 0.0 - 2.0 mmol/L   O2 Saturation 95.2 %   Patient temperature 37.0     Comment: Performed at [facility], [address] [place], [place] [address]  Blood gas, venous (at [facility])     Status: Abnormal   Collection Time: 10/03/22  9:36 AM  Result Value Ref Range   pH, Ven 7.46 (H) 7.25 - 7.43   pCO2, Ven 43 (L) 44 - 60 mmHg   pO2, Ven 52 (H) 32 - 45 mmHg   Bicarbonate 30.6 (H) 20.0 - 28.0 mmol/L   Acid-Base Excess 6.0 (H) 0.0 - 2.0 mmol/L   O2 Saturation 85.3 %   Patient temperature 37.0     Comment: Performed at [facility], [address] [place], [place] [address]   CT HEAD WO CONTRAST ( )  Result Date: 10/03/2022 CLINICAL DATA:  Altered mental status, nontraumatic EXAM: CT HEAD WITHOUT CONTRAST TECHNIQUE: Contiguous axial images were obtained from the base of the skull through the vertex without intravenous contrast. RADIATION DOSE REDUCTION: This exam was performed according to the departmental dose-optimization program which includes automated exposure control,  adjustment of the mA and/or kV according to patient size and/or use of iterative reconstruction technique. COMPARISON:  None Available. FINDINGS: Brain: No evidence of acute infarction, hemorrhage, hydrocephalus, or extra-axial collection. 1.1 cm calcified extra-axial mass in the high left frontal region, most compatible with a small calcified meningioma (series 4, image 30). No significant mass effect of the underlying cortex. No associated vasogenic edema. Mild age related cerebral volume loss. Vascular: No hyperdense vessel or unexpected calcification.  Skull: Normal. Negative for fracture or focal lesion. Sinuses/Orbits: No acute finding. Other: None. IMPRESSION: 1. No acute intracranial abnormality. 2. 1.1 cm calcified extra-axial mass in the high left frontal region, most compatible with a small calcified meningioma. This could be confirmed with nonemergent MRI of the brain with and without contrast. Electronically Signed   By: Duanne Guess D.O.   On: 10/03/2022 09:29   CT Abdomen Pelvis W Contrast  Result Date: 11/01/2022 CLINICAL DATA:  Acute nonlocalized abdominal pain. Diagnosis of pancreatic mass with liver metastasis. EXAM: CT ABDOMEN AND PELVIS WITH CONTRAST TECHNIQUE: Multidetector CT imaging of the abdomen and pelvis was performed using the standard protocol following bolus administration of intravenous contrast. RADIATION DOSE REDUCTION: This exam was performed according to the departmental dose-optimization program which includes automated exposure control, adjustment of the mA and/or kV according to patient size and/or use of iterative reconstruction technique. CONTRAST:  OMNIPAQUE IOHEXOL 350 MG/ML SOLN COMPARISON:  CT 09/15/2022 FINDINGS: Lower chest: Assessed on concurrent chest CT, reported separately. Hepatobiliary: Progressive enlarging hepatic metastatic disease. For example, right lobe liver lesion measures 5 x 4 cm, previously 4.7 x 4.2 cm. The liver is enlarged spanning 25.7  cm cranial caudal. No portal vein thrombosis. Unremarkable gallbladder. Pancreas: Hypodense lesion in the pancreatic tail measures approximately 6.3 x 5.1 cm, previously 6 x 5 cm. Motion artifact limits detailed assessment. There is mild adjacent fat stranding that is new from prior exam. No pancreatic ductal dilatation. Spleen: Enlarged spanning 13.8 cm cranial caudal. No discrete splenic lesion. Adrenals/Urinary Tract: No adrenal nodules. No hydronephrosis. Punctate nonobstructing left renal stone. Small right renal cyst, needing no further imaging follow-up. Minimally distended urinary bladder. No definitive bladder wall thickening. Stomach/Bowel: Nondistended stomach. No bowel obstruction or inflammation. Moderate stool in the distal colon. Normal appendix. Vascular/Lymphatic: Aortic atherosclerosis and tortuosity. No aneurysm. The portal vein remains patent. 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 There is a 14 mm porta hepatis node, series 2, image 40. Additional prominent nodes adjacent to the porta hepatis and SMV. There is a 15 mm peripancreatic node centrally, series 2, image 42. Reproductive: TURP defect in the prostate. Other: Trace perihepatic and perisplenic ascites. Small amount of free fluid in the pelvis dependently. This is new from prior. No mesenteric thickening. Musculoskeletal: Scoliosis and degenerative change in the spine. Bilateral hip osteoarthritis. No focal bone lesion or acute osseous findings. IMPRESSION: 1. Known hypodense mass in the pancreatic tail has slightly increased in size from prior exam, with mild adjacent fat stranding. 2. Progressive hepatic metastatic disease. 3. Upper abdominal adenopathy. 4. Small volume upper abdominal and pelvic ascites, new. 5. Hepatomegaly and splenomegaly again seen. 6. 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 7. Nonobstructing left nephrolithiasis. Aortic  Atherosclerosis (ICD10-I70.0). Electronically Signed   By: Narda Rutherford M.D.   On: 11/01/2022 21:55   CT Angio Chest PE W/Cm &/Or Wo Cm  Result Date: 11-01-22 CLINICAL DATA:  Pulmonary embolism (PE) suspected, low to intermediate prob, positive D-dimer EXAM: CT ANGIOGRAPHY CHEST WITH CONTRAST TECHNIQUE: Multidetector CT imaging of the chest was performed using the standard protocol during bolus administration of intravenous contrast. Multiplanar CT image reconstructions and MIPs were obtained to evaluate the vascular anatomy. RADIATION DOSE REDUCTION: This exam was performed according to the departmental dose-optimization program which includes automated exposure control, adjustment of the mA and/or kV according to patient size and/or use of iterative reconstruction technique. CONTRAST:  OMNIPAQUE IOHEXOL 350 MG/ML SOLN COMPARISON:  Chest radiograph earlier today. Chest CTA 09/15/2022 FINDINGS: Cardiovascular: Subsegmental filling defects within the right upper lobe pulmonary arteries, for example series 5, image 107, new from prior exam. Small nonocclusive filling defects within the right middle lobe, left upper lobe and lingular segmental pulmonary arteries, also new. No right heart strain. Minimal aortic atherosclerosis. Heart is normal in size. There are coronary artery calcifications. Minimal pericardial effusion. Mediastinum/Nodes: No enlarged mediastinal or hilar lymph nodes. No esophageal wall thickening. No thyroid nodule. Lungs/Pleura: Small right pleural effusion is increased from prior CT. There is no definite pleural nodularity or enhancement. Increasing atelectasis in the right lower lobe. There is compressive atelectasis in the right middle lobe related to elevated hemidiaphragm. No evidence of pulmonary infarct. No pulmonary nodule. Breathing motion artifact limits detailed assessment. Upper Abdomen: Assessed on concurrent abdominopelvic CT, reported separately. Musculoskeletal: No  focal bone lesion or acute osseous findings. Review of the MIP images confirms the above findings. IMPRESSION: 1. Small bilateral nonocclusive pulmonary emboli, new from prior exam. No right heart strain. 2. Small right pleural effusion has increased from prior CT. Increasing atelectasis in the right lower lobe. Increasing right middle lobe atelectasis related to elevated right hemidiaphragm. 3. Coronary artery calcifications. Aortic Atherosclerosis (ICD10-I70.0). Critical Value/emergent results were called by telephone at the time of interpretation on 10-15-22 at 9:47 pm to provider [facility] , who verbally acknowledged these results. Electronically Signed   By: Narda Rutherford M.D.   On: 10-15-22 21:48   DG Chest Port 1 View  Result Date: 15-Oct-2022 CLINICAL DATA:  Shortness of breath. Patient with history of stage IV pancreatic cancer. EXAM: PORTABLE CHEST 1 VIEW COMPARISON:  Chest radiograph 08/24/2022, CT 09/15/2022 FINDINGS: Increasing right pleural effusion and progressive volume loss at the right lung base. Associated basilar airspace disease. Heart is grossly stable in size allowing for portable technique. No pulmonary edema or pneumothorax. On limited assessment, no acute osseous findings. IMPRESSION: Increasing right pleural effusion and progressive volume loss at the right lung base. Electronically Signed   By: Narda Rutherford M.D.   On: 15-Oct-2022 16:54    Pending Labs Unresulted Labs (From admission, onward)     Start     Ordered   10/04/22 0500  CBC  Tomorrow morning,   R        10/03/22 0954   10/04/22 0500  Basic metabolic panel  Tomorrow morning,   R        10/03/22 0954   10/03/22 1500  Heparin level (unfractionated)  Once,   R        10/03/22 0818   10/03/22 1022  Ammonia  Once,   STAT        10/03/22 1022   10/03/22 1021  Comprehensive metabolic panel  Once,   STAT        10/03/22 1021   10/03/22 1021  Magnesium  Once,   STAT        10/03/22 1021   10/03/22 0500   CBC  Daily,   R      10/03/22 0101   October 15, 2022 1914  Lactic acid, plasma  Now then every 2 hours,   R (with STAT occurrences)      Oct 15, 2022 1913            Vitals/Pain Today's Vitals   10/03/22 0508 10/03/22 0923 10/03/22 0945 10/03/22 1000  BP: 114/77  139/83 (!) 155/91  Pulse: (!) 144  (!) 118 (!) 150  Resp: (!) 27 (!) 31 (!) 44 (!) 25  Temp: 98.6 F (37 C)     TempSrc: Oral     SpO2: 96% (!) 84% 90% 92%  Weight:      Height:    6\' 11"  (2.108 m)  PainSc:        Isolation Precautions No active isolations  Medications Medications  diltiazem (CARDIZEM) 125 mg in dextrose 5% 125 mL (1 mg/mL) infusion (15 mg/hr Intravenous New Bag/Given 10/03/22 0812)  heparin bolus via infusion 4,000 Units (4,000 Units Intravenous Bolus from Bag 10-10-22 2309)    Followed by  heparin ADULT infusion 100 units/mL (25000 units/230mL) (2,600 Units/hr Intravenous New Bag/Given 10/03/22 0838)  acetaminophen (TYLENOL) tablet 650 mg (has no administration in time range)    Or  acetaminophen (TYLENOL) suppository 650 mg (has no administration in time range)  ondansetron (ZOFRAN) tablet 4 mg (has no administration in time range)    Or  ondansetron (ZOFRAN) injection 4 mg (has no administration in time range)  albuterol (PROVENTIL) (2.5 MG/3ML) 0.083% nebulizer solution 2.5 mg (has no administration in time range)  diltiazem (CARDIZEM CD) 24 hr capsule 300 mg (300 mg Oral Not Given 10/03/22 1004)  metoprolol tartrate (LOPRESSOR) tablet 75 mg (75 mg Oral Patient Refused/Not Given 10/03/22 1005)  pantoprazole (PROTONIX) EC tablet 40 mg (40 mg Oral Patient Refused/Not Given 10/03/22 1005)  montelukast (SINGULAIR) tablet 10 mg (has no administration in time range)  fluticasone (FLONASE) 50 MCG/ACT nasal spray 2 spray (has no administration in time range)  haloperidol (HALDOL) tablet 2 mg ( Oral See Alternative 10/03/22 0506)    Or  haloperidol lactate (HALDOL) injection 2 mg (2 mg Intramuscular Given 10/03/22  0506)  sodium chloride 0.9 % bolus 1,000 mL (1,000 mLs Intravenous New Bag/Given 10/03/22 0912)    Followed by  0.9 %  sodium chloride infusion ( Intravenous New Bag/Given 10/03/22 0920)  docusate sodium (COLACE) capsule 100 mg (has no administration in time range)  polyethylene glycol (MIRALAX / GLYCOLAX) packet 17 g (has no administration in time range)  amiodarone (NEXTERONE) 1.8 mg/mL load via infusion 150 mg (has no administration in time range)  sodium chloride 0.9 % bolus 500 mL (0 mLs Intravenous Stopped 10-Oct-2022 1650)  diltiazem (CARDIZEM) injection 10 mg (10 mg Intravenous Given 2022-10-10 1612)  morphine (PF) 4 MG/ML injection 4 mg (4 mg Intravenous Given October 10, 2022 1700)  potassium chloride 10 mEq in 100 mL IVPB (0 mEq Intravenous Stopped 2022-10-10 2152)  iohexol (OMNIPAQUE) 350 MG/ML injection 100 mL (100 mLs Intravenous Contrast Given Oct 10, 2022 2122)  LORazepam (ATIVAN) injection 1 mg (1 mg Intravenous Given 10/03/22 0255)  heparin bolus via infusion 4,000 Units (4,000 Units Intravenous Bolus from Bag 10/03/22 0838)  metoprolol tartrate (LOPRESSOR) injection 5 mg (5 mg Intravenous Given 10/03/22 1001)    Mobility non-ambulatory     Focused Assessments Cardiac Assessment Handoff:    No results found for: "CKTOTAL", "CKMB", "CKMBINDEX", "TROPONINI" No results found for: "DDIMER" Does the Patient currently have chest pain? No    R Recommendations: See Admitting Provider Note  Report given to:   Additional Notes:

## 2022-10-04 DIAGNOSIS — I4891 Unspecified atrial fibrillation: Secondary | ICD-10-CM | POA: Diagnosis not present

## 2022-10-04 LAB — CULTURE, BLOOD (ROUTINE X 2): Culture: NO GROWTH

## 2022-10-04 LAB — BASIC METABOLIC PANEL
Anion gap: 7 (ref 5–15)
BUN: 44 mg/dL — ABNORMAL HIGH (ref 8–23)
CO2: 21 mmol/L — ABNORMAL LOW (ref 22–32)
Calcium: 11.9 mg/dL — ABNORMAL HIGH (ref 8.9–10.3)
Chloride: 110 mmol/L (ref 98–111)
Creatinine, Ser: 1.26 mg/dL — ABNORMAL HIGH (ref 0.61–1.24)
GFR, Estimated: >60 mL/min (ref >60)
Glucose, Bld: 125 mg/dL — ABNORMAL HIGH (ref 70–99)
Potassium: 3.6 mmol/L (ref 3.5–5.1)
Sodium: 138 mmol/L (ref 135–145)

## 2022-10-04 LAB — CBC
HCT: 43.3 % (ref 39.0–52.0)
Hemoglobin: 13.5 g/dL (ref 13.0–17.0)
MCH: 27.4 pg (ref 26.0–34.0)
MCHC: 31.2 g/dL (ref 30.0–36.0)
MCV: 88 fL (ref 80.0–100.0)
Platelets: 267 10*3/uL (ref 150–400)
RBC: 4.92 MIL/uL (ref 4.22–5.81)
RDW: 20 % — ABNORMAL HIGH (ref 11.5–15.5)
WBC: 55.9 10*3/uL (ref 4.0–10.5)
nRBC: 0.3 % — ABNORMAL HIGH (ref 0.0–0.2)

## 2022-10-04 LAB — PHOSPHORUS
Phosphorus: 2.5 mg/dL (ref 2.5–4.6)
Phosphorus: 2.5 mg/dL (ref 2.5–4.6)

## 2022-10-04 LAB — HEPARIN LEVEL (UNFRACTIONATED)
Heparin Unfractionated: 0.33 IU/mL (ref 0.30–0.70)
Heparin Unfractionated: 0.37 IU/mL (ref 0.30–0.70)

## 2022-10-04 LAB — CULTURE, RESPIRATORY W GRAM STAIN: Culture: NO GROWTH

## 2022-10-04 LAB — GLUCOSE, CAPILLARY
Glucose-Capillary: 131 mg/dL — ABNORMAL HIGH (ref 70–99)
Glucose-Capillary: 143 mg/dL — ABNORMAL HIGH (ref 70–99)
Glucose-Capillary: 177 mg/dL — ABNORMAL HIGH (ref 70–99)

## 2022-10-04 LAB — MAGNESIUM
Magnesium: 2 mg/dL (ref 1.7–2.4)
Magnesium: 2 mg/dL (ref 1.7–2.4)

## 2022-10-04 LAB — CORTISOL: Cortisol, Plasma: 21 ug/dL

## 2022-10-04 MED ORDER — VANCOMYCIN HCL 2000 MG/400ML IV SOLN
2000.0000 mg | Freq: Once | INTRAVENOUS | Status: AC
  Administered 2022-10-04: 2000 mg via INTRAVENOUS
  Filled 2022-10-04: qty 400

## 2022-10-04 MED ORDER — MONTELUKAST SODIUM 10 MG PO TABS
10.0000 mg | ORAL_TABLET | Freq: Every day | ORAL | Status: DC
  Administered 2022-10-04 – 2022-10-05 (×2): 10 mg
  Filled 2022-10-04 (×2): qty 1

## 2022-10-04 MED ORDER — VITAL HIGH PROTEIN PO LIQD
1000.0000 mL | ORAL | Status: DC
  Administered 2022-10-04: 1000 mL

## 2022-10-04 MED ORDER — VASOPRESSIN 20 UNITS/100 ML INFUSION FOR SHOCK
0.0000 [IU]/min | INTRAVENOUS | Status: DC
  Administered 2022-10-04 – 2022-10-06 (×6): 0.03 [IU]/min via INTRAVENOUS
  Filled 2022-10-04 (×6): qty 100

## 2022-10-04 MED ORDER — PROSOURCE TF20 ENFIT COMPATIBL EN LIQD
60.0000 mL | Freq: Every day | ENTERAL | Status: DC
  Administered 2022-10-04 – 2022-10-05 (×2): 60 mL
  Filled 2022-10-04 (×2): qty 60

## 2022-10-04 MED ORDER — VANCOMYCIN HCL 1250 MG/250ML IV SOLN: 1250.0000 mg | Freq: Two times a day (BID) | INTRAVENOUS | Status: DC

## 2022-10-04 MED ORDER — SODIUM CHLORIDE 0.9 % IV SOLN
2.0000 g | Freq: Three times a day (TID) | INTRAVENOUS | Status: DC
  Administered 2022-10-04 – 2022-10-05 (×6): 2 g via INTRAVENOUS
  Filled 2022-10-04 (×6): qty 12.5

## 2022-10-04 NOTE — TOC Progression Note (Signed)
Transition of Care [facility]) - Progression Note    Patient Details  Name: Craig Cole MRN: [id number] Date of Birth: Sep 26, 1960  [facility]) CM/SW Contact  Adrian Prows, RN Phone Number: 10/04/2022, 9:20 AM  Clinical Narrative:    Unable to complete TOC assessment; pt on ventilator.        Expected Discharge Plan and Services                                               Social Determinants of Health (SDOH) Interventions SDOH Screenings   Food Insecurity: No Food Insecurity (09/24/2022)  Housing: Low Risk  (09/24/2022)  Transportation Needs: No Transportation Needs (09/24/2022)  Utilities: Not At Risk (09/24/2022)  Depression (PHQ2-9): Low Risk  (09/24/2022)  Tobacco Use: Medium Risk (09/15/2022)    Readmission Risk Interventions     No data to display

## 2022-10-04 NOTE — Progress Notes (Signed)
Pharmacy Antibiotic Note  Craig Cole is a 62 y.o. male admitted on 10/05/2022 with AFib.  Patient initially started on ceftriaxone and Azithromycin for CAP.  Patient febrile with increasing WBC.  Patient with metastatic pancreatic cancer with liver mets.  Ceftriaxone d/c'ed and Pharmacy has been consulted for Vancomycin and Cefepime dosing.  Plan: Vancomycin 2gm IV x 1 followed by Vancomycin 1250 mg IV Q 12 hrs. Goal AUC 400-550.  Expected AUC: 530.2  SCr used: 1.26 Cefepime 2gm IV q8h Continue Azithromycin Follow renal function F/u culture results and sensivities  Height: 6\' 5"  (195.6 cm) (Per family) Weight: (!) 140.1 kg (308 lb 13.8 oz) IBW/kg (Calculated) : 89.1  Temp (24hrs), Avg:99.7 F (37.6 C), Min:97.3 F (36.3 C), Max:102.7 F (39.3 C)  Recent Labs  Lab 10/10/2022 1455 09/23/2022 1705 09/22/2022 2035 10/03/22 0600 10/03/22 1013 10/03/22 1231 10/04/22 0427  WBC 41.6*  --   --  48.7*  --   --  55.9*  CREATININE  --  0.54*  --   --  0.77  --  1.26*  LATICACIDVEN  --   --  1.8  --   --  2.4*  --     Estimated Creatinine Clearance: 92.9 mL/min (A) (by C-G formula based on SCr of 1.26 mg/dL (H)).    No Known Allergies  Antimicrobials this admission: 4/13 Ceftriaxone >> 4/14 4/13 Azithromycin >>   4/14 Cefepime >> 4/14 Vancomycin >>  Dose adjustments this admission:    Microbiology results: 4/14 Blood Cx: 4/13 Trach Aspirate:  4/13 MRSA PCR: negative  Thank you for allowing pharmacy to be a part of this patient's care.  Maryellen Pile, PharmD 10/04/2022 5:11 AM

## 2022-10-04 NOTE — Progress Notes (Signed)
ANTICOAGULATION CONSULT NOTE - Follow Up  Pharmacy Consult for Heparin Indication: pulmonary embolus  No Known Allergies  Patient Measurements: Height:  (195.6 cm) (Per family) Weight: (!) 153.2 kg (337 lb 11.9 oz) IBW/kg (Calculated) : 89.1 Heparin Dosing Weight: 120 kg -Ht updated, resulting in change in heparin dosing weight -Bed weight obtained   Vital Signs: Temp: 100.9 F (38.3 C) (04/14 0800) Temp Source: Axillary (04/14 0800) BP: 100/70 (04/14 0719) Pulse Rate: 88 (04/14 0719)  Labs: Recent Labs    09/29/2022 1455 10/13/2022 1705 10/03/2022 1711 10/03/22 0600 10/03/22 0940 10/03/22 0955 10/03/22 1013 10/03/22 1558 10/04/22 0239 10/04/22 0427 10/04/22 0844  HGB 13.1  --   --  12.9*  --   --   --   --   --  13.5  --   HCT 42.0  --   --  42.6  --   --   --   --   --  43.3  --   PLT 193  --   --  180  --   --   --   --   --  267  --   LABPROT  --  17.0*  --   --   --   --   --   --   --   --   --   INR  --  1.4*  --   --   --   --   --   --   --   --   --   HEPARINUNFRC  --   --   --  <0.10*  --    < >  --  0.28* 0.33  --  0.37  CREATININE  --  0.54*  --   --   --   --  0.77  --   --  1.26*  --   TROPONINIHS 15  --  11  --  15  --   --   --   --   --   --    < > = values in this interval not displayed.     Estimated Creatinine Clearance: 97.4 mL/min (A) (by C-G formula based on SCr of 1.26 mg/dL (H)).   Medical History: Past Medical History:  Diagnosis Date   Cancer (HCC)    skin cancer   GERD (gastroesophageal reflux disease)    Glaucoma    High risk sexual behavior    on PrEP for HIV   Hyperlipidemia    Hypertension    Sleep apnea     Medications: No oral anticoagulation PTA  Assessment: Pt is a 42 yoM with recent diagnosis of pancreatic cancer with mets to the liver. Pt recently diagnosed with atrial fibrillation, not on anticoagulant PTA. Pt was scheduled for port placement on 4/12 [facility] placement delayed and pt sent to ED for afib with RVR.    CTA: Small bilateral nonocclusive PE, no right heart strain. Pharmacy consulted to dose heparin for PE and atrial fibrillation. Pt intubated 4/13.   Today, 10/04/22 0239 am Heparin level = 0.33 (therapeutic) with heparin infusion @ 2800 units/hr 0844 confirmatory heparin level = 0.37 - remains therapeutic on 2800 units/hr CBC:WNL No complications of therapy reported  Goal of Therapy:  Heparin level 0.3-0.7 units/ml Monitor platelets by anticoagulation protocol: Yes   Plan:  Continue heparin infusion @ 2800 units/hr CBC, heparin level daily while on heparin infusion Monitor for signs of bleeding  Herby Abraham, Pharm.D Use secure chat  for questions 10/04/2022 10:57 AM

## 2022-10-04 NOTE — Progress Notes (Signed)
NAME:  Craig Cole, MRN:  [id number], DOB:  05-30-1961, LOS: 1 ADMISSION DATE:  09/21/2022, CONSULTATION DATE:  10/03/2022 REFERRING MD:  Dr [place], CHIEF COMPLAINT:  Respiratory failure   History of Present Illness:  Patient with a history of stage IV metastatic pancreatic cancer with liver mets followed by Dr. Ellin Saba who was scheduled for port placement next, noted to be in A-fib with RVR.  Referred to the hospital for further evaluation and management Found to have pulmonary embolism for which she was started on anticoagulation Oxygen requirement worsening, agitation, altered mental status Background history of asthma, GERD, hypertension, hyperlipidemia Diagnosed about 2 weeks ago with A-fib   For his pancreatic cancer, has not started therapy yet but was scheduled for therapy following port placement   Spoke with patient and his sister He was feeling relatively well may be slower than his usual prior to showing up for port placement Did not have specific complaints  Pertinent  Medical History   Past Medical History:  Diagnosis Date   Cancer (HCC)    skin cancer   GERD (gastroesophageal reflux disease)    Glaucoma    High risk sexual behavior    on PrEP for HIV   Hyperlipidemia    Hypertension    Sleep apnea    Significant Hospital Events: Including procedures, antibiotic start and stop dates in addition to other pertinent events   4/13 CT head with no significant intracranial abnormality, 1.1 cm calcified extra-axial mass felt to be related to small calcified meningioma 4/12-progressive hepatic metastatic disease, small ascites, 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 rate (!) 28, height  (1.956 m), weight (!) 140.1 kg, SpO2 99 %. CVP:  [3 mmHg-7 mmHg] 7 mmHg  Vent Mode: PRVC FiO2 (%):  [40 %-100 %] 40 % Set Rate:  [16 bmp-20 bmp] 20 bmp Vt Set:  [650 mL] 650 mL PEEP:  [5 cmH20-8 cmH20] 8 cmH20 Plateau Pressure:  [20 cmH20-25 cmH20] 22 cmH20   Intake/Output Summary (Last 24 hours) at 10/04/2022 0740 Last data filed at 10/04/2022 8295 Gross per 24 hour  Intake 6486.01 ml  Output 225 ml  Net 6261.01 ml   Filed Weights   10/04/2022 1438 10/03/22 1537  Weight: (!) 148 kg (!) 140.1 kg    Examination: General: Middle-age, does not appear to be in distress, appears comfortable on the vent HENT: Dry oral mucosa Lungs: Decreased air movement bibasilarly worse on the right Cardiovascular: S1-S2 appreciated Abdomen: Soft, bowel sounds appreciated Extremities: No clubbing, no edema, chronic venous stasis changes Neuro: Sedated on vent GU:   Resolved Hospital Problem list     Assessment & Plan:  Hypoxemic respiratory failure --mechanical ventilation -Target TVol 6-8cc/kgIBW -Target Plateau Pressure < 30cm H20 -Target driving pressure less than 15 cm of water -target PaO2 55-65: titrate PEEP/FiO2 per protocol -Ventilator associated pneumonia prevention protocol  Atrial fibrillation with RVR -On amiodarone -On anticoagulation  Bilateral pulmonary embolism -On heparin, therapeutic  Metastatic pancreatic cancer -Yet to start therapy -Appears to be aggressively  progressive disease  Right pleural effusion Atelectasis -Concern for pneumonia -On antibiotics at present -Was not having significant symptoms of respiratory infection prior to presentation however course confounded by  marked leukocytosis  Leukocytosis -Etiology is unclear -Continue empiric antibiotics at present -Urine did not show any signs of infection, was not having significant cough prior to presentation -Questionable related to neoplastic process -On azithromycin and cefepime Vancomycin will be discontinued  Acute metabolic encephalopathy -Multifactorial -Ammonia level 38  History of asthma -Bronchodilators as needed  On [place] for high risk sexual behavior on PrEP therapy  History of obstructive sleep apnea on CPAP therapy at home  Best Practice (right click and "Reselect all SmartList Selections" daily)   Diet/type: tubefeeds DVT prophylaxis: systemic heparin GI prophylaxis: PPI Lines: Central line Foley:  Yes, and it is still needed Code Status:  full code Last date of multidisciplinary goals of care discussion [discussed with sister at bedside 10/03/2022]  Labs   CBC: Recent Labs  Lab 09/26/2022 1455 10/03/22 0600 10/04/22 0427  WBC 41.6* 48.7* 55.9*  NEUTROABS 34.8* 39.1*  --   HGB 13.1 12.9* 13.5  HCT 42.0 42.6 43.3  MCV 86.6 87.8 88.0  PLT 193 180 267    Basic Metabolic Panel: Recent Labs  Lab 10/05/2022 1705 10/03/22 1013 10/04/22 0427  NA 144 141 138  K 2.4* 3.7 3.6  CL 122* 113* 110  CO2 18* 24 21*  GLUCOSE 87 117* 125*  BUN 28* 36* 44*  CREATININE 0.54* 0.77 1.26*  CALCIUM 8.7* 12.6* 11.9*  MG  --  2.1  --    GFR: Estimated Creatinine Clearance: 92.9 mL/min (A) (by C-G formula based on SCr of 1.26 mg/dL (H)). Recent Labs  Lab 10/09/2022 1455 09/21/2022 2035 10/03/22 0600 10/03/22 1231 10/04/22 0427  WBC 41.6*  --  48.7*  --  55.9*  LATICACIDVEN  --  1.8  --  2.4*  --     Liver Function Tests: Recent Labs  Lab 10/15/2022 1705 10/03/22 1013  AST 52* 91*  ALT 35 59*  ALKPHOS 207* 294*  BILITOT 2.1* 4.4*  PROT 3.7* 5.6*  ALBUMIN <1.5* 2.0*   No results for input(s): "LIPASE", "AMYLASE" in the last 168 hours. Recent Labs  Lab 10/03/22 1013   AMMONIA 38*    ABG    Component Value Date/Time   PHART 7.42 10/03/2022 1528   PCO2ART 38 10/03/2022 1528   PO2ART 199 (H) 10/03/2022 1528   HCO3 24.7 10/03/2022 1528   O2SAT 99.6 10/03/2022 1528     Coagulation Profile: Recent Labs  Lab 10/04/2022 1705  INR 1.4*    Cardiac Enzymes: No results for input(s): "CKTOTAL", "CKMB", "CKMBINDEX", "TROPONINI" in the last 168 hours.  HbA1C: No results found for: "HGBA1C"  CBG: Recent Labs  Lab 09/25/2022 1235  GLUCAP 127*    Review of Systems:   Fever overnight  Past Medical History:  He,  has a past medical history of Cancer (HCC), GERD (gastroesophageal reflux disease), Glaucoma, High risk sexual behavior, Hyperlipidemia, Hypertension, and Sleep apnea.   Surgical History:   Past Surgical History:  Procedure Laterality Date   COLONOSCOPY  05/25/2012   [facility]; rare diverticulum.   COLONOSCOPY WITH PROPOFOL N/A 04/28/2021   Procedure: COLONOSCOPY WITH PROPOFOL;  Surgeon: [name], MD;  Location: [facility];  Service: Endoscopy;  Laterality: N/A;  8:45am   POLYPECTOMY  04/28/2021   Procedure: POLYPECTOMY;  Surgeon: [name], MD;  Location: [facility];  Service: Endoscopy;;   RETINAL DETACHMENT SURGERY Left    TONSILLECTOMY       Social History:   reports that he quit smoking about 25 years ago. His smoking use included cigarettes. He smoked an average of 1 pack per day. He has never used smokeless tobacco. He reports that he does not currently use alcohol. He reports that he does not use drugs.   Family History:  His family history includes Diabetes in his mother; Hypertension in his mother; Myasthenia gravis in his father; Prostate cancer in his brother. There is no history of Colon cancer.   Allergies No Known Allergies   The patient is critically ill with multiple organ systems failure and requires high complexity decision making for assessment and support,  frequent evaluation and titration of therapies, application of advanced monitoring technologies and extensive interpretation of multiple databases. Critical Care Time devoted to patient care services described in this note independent of APP/resident time (if applicable)  is 40 minutes.   [name] MD [facility] Personal pager: See Amion If unanswered, please page CCM On-call: #[phone]

## 2022-10-04 NOTE — Progress Notes (Signed)
ANTICOAGULATION CONSULT NOTE - Follow Up  Pharmacy Consult for Heparin Indication: pulmonary embolus  No Known Allergies  Patient Measurements: Height: 6\' 5"  (195.6 cm) (Per family) Weight: (!) 140.1 kg (308 lb 13.8 oz) IBW/kg (Calculated) : 89.1 Heparin Dosing Weight: 120 kg -Ht updated, resulting in change in heparin dosing weight -Bed weight obtained   Vital Signs: Temp: 102.1 F (38.9 C) (04/14 0015) Temp Source: Axillary (04/14 0015) BP: 99/64 (04/13 2318) Pulse Rate: 90 (04/13 2318)  Labs: Recent Labs    09/28/2022 1455 10/03/2022 1705 10/20/2022 1711 10/03/22 0600 10/03/22 0600 10/03/22 0940 10/03/22 0955 10/03/22 1013 10/03/22 1558 10/04/22 0239  HGB 13.1  --   --  12.9*  --   --   --   --   --   --   HCT 42.0  --   --  42.6  --   --   --   --   --   --   PLT 193  --   --  180  --   --   --   --   --   --   LABPROT  --  17.0*  --   --   --   --   --   --   --   --   INR  --  1.4*  --   --   --   --   --   --   --   --   HEPARINUNFRC  --   --   --  <0.10*   < >  --  <0.10*  --  0.28* 0.33  CREATININE  --  0.54*  --   --   --   --   --  0.77  --   --   TROPONINIHS 15  --  11  --   --  15  --   --   --   --    < > = values in this interval not displayed.     Estimated Creatinine Clearance: 146.4 mL/min (by C-G formula based on SCr of 0.77 mg/dL).   Medical History: Past Medical History:  Diagnosis Date   Cancer (HCC)    skin cancer   GERD (gastroesophageal reflux disease)    Glaucoma    High risk sexual behavior    on PrEP for HIV   Hyperlipidemia    Hypertension    Sleep apnea     Medications: No oral anticoagulation PTA  Assessment: Pt is a 19 yoM with recent diagnosis of pancreatic cancer with mets to the liver. Pt recently diagnosed with atrial fibrillation, not on anticoagulant PTA. Pt was scheduled for port placement on 4/12 [facility] placement delayed and pt sent to ED for afib with RVR.   CTA: Small bilateral nonocclusive PE, no right heart  strain. Pharmacy consulted to dose heparin for PE and atrial fibrillation. Pt intubated 4/13.   Today, 10/04/22 Heparin level = 0.33 (therapeutic) with heparin infusion @ 2800 units/hr CBC: pending No complications of therapy noted  Goal of Therapy:  Heparin level 0.3-0.7 units/ml Monitor platelets by anticoagulation protocol: Yes   Plan:  Continue heparin infusion @ 2800 units/hr Recheck heparin level in 6 hours to confirm therapeutic dose  CBC, heparin level daily while on heparin infusion Monitor for signs of bleeding  Maryellen Pile, PharmD 10/04/22 3:11 AM

## 2022-10-05 ENCOUNTER — Inpatient Hospital Stay: Payer: No Typology Code available for payment source | Admitting: Hematology

## 2022-10-05 ENCOUNTER — Encounter: Payer: BC Managed Care – PPO | Admitting: Dietician

## 2022-10-05 ENCOUNTER — Inpatient Hospital Stay: Payer: No Typology Code available for payment source

## 2022-10-05 ENCOUNTER — Inpatient Hospital Stay (HOSPITAL_COMMUNITY): Payer: No Typology Code available for payment source

## 2022-10-05 DIAGNOSIS — I4891 Unspecified atrial fibrillation: Secondary | ICD-10-CM | POA: Diagnosis not present

## 2022-10-05 LAB — COMPREHENSIVE METABOLIC PANEL
ALT: 165 U/L — ABNORMAL HIGH (ref 0–44)
AST: 205 U/L — ABNORMAL HIGH (ref 15–41)
Albumin: 1.6 g/dL — ABNORMAL LOW (ref 3.5–5.0)
Alkaline Phosphatase: 214 U/L — ABNORMAL HIGH (ref 38–126)
Anion gap: 10 (ref 5–15)
BUN: 51 mg/dL — ABNORMAL HIGH (ref 8–23)
CO2: 19 mmol/L — ABNORMAL LOW (ref 22–32)
Calcium: 10.1 mg/dL (ref 8.9–10.3)
Chloride: 103 mmol/L (ref 98–111)
Creatinine, Ser: 1.35 mg/dL — ABNORMAL HIGH (ref 0.61–1.24)
GFR, Estimated: 59 mL/min — ABNORMAL LOW (ref >60)
Glucose, Bld: 168 mg/dL — ABNORMAL HIGH (ref 70–99)
Potassium: 3.5 mmol/L (ref 3.5–5.1)
Sodium: 132 mmol/L — ABNORMAL LOW (ref 135–145)
Total Bilirubin: 6.4 mg/dL — ABNORMAL HIGH (ref 0.3–1.2)
Total Protein: 5.1 g/dL — ABNORMAL LOW (ref 6.5–8.1)

## 2022-10-05 LAB — GLUCOSE, CAPILLARY
Glucose-Capillary: 136 mg/dL — ABNORMAL HIGH (ref 70–99)
Glucose-Capillary: 136 mg/dL — ABNORMAL HIGH (ref 70–99)
Glucose-Capillary: 151 mg/dL — ABNORMAL HIGH (ref 70–99)
Glucose-Capillary: 152 mg/dL — ABNORMAL HIGH (ref 70–99)
Glucose-Capillary: 146 mg/dL — ABNORMAL HIGH (ref 70–99)
Glucose-Capillary: 132 mg/dL — ABNORMAL HIGH (ref 70–99)

## 2022-10-05 LAB — PHOSPHORUS
Phosphorus: 2.2 mg/dL — ABNORMAL LOW (ref 2.5–4.6)
Phosphorus: 2.7 mg/dL (ref 2.5–4.6)

## 2022-10-05 LAB — MAGNESIUM
Magnesium: 2.1 mg/dL (ref 1.7–2.4)
Magnesium: 1.7 mg/dL (ref 1.7–2.4)

## 2022-10-05 LAB — CBC
HCT: 42 % (ref 39.0–52.0)
Hemoglobin: 13.2 g/dL (ref 13.0–17.0)
MCH: 27.4 pg (ref 26.0–34.0)
MCHC: 31.4 g/dL (ref 30.0–36.0)
MCV: 87.3 fL (ref 80.0–100.0)
Platelets: 204 10*3/uL (ref 150–400)
RBC: 4.81 MIL/uL (ref 4.22–5.81)
RDW: 20.2 % — ABNORMAL HIGH (ref 11.5–15.5)
WBC: 55.7 10*3/uL (ref 4.0–10.5)
nRBC: 0.1 % (ref 0.0–0.2)

## 2022-10-05 LAB — CULTURE, RESPIRATORY W GRAM STAIN

## 2022-10-05 LAB — HEPARIN LEVEL (UNFRACTIONATED): Heparin Unfractionated: 0.33 IU/mL (ref 0.30–0.70)

## 2022-10-05 LAB — CULTURE, BLOOD (ROUTINE X 2): Special Requests: ADEQUATE

## 2022-10-05 MED ORDER — ACETAMINOPHEN 650 MG RE SUPP: 650.0000 mg | Freq: Four times a day (QID) | RECTAL | Status: DC | PRN

## 2022-10-05 MED ORDER — NOREPINEPHRINE 16 MG/250ML-% IV SOLN
0.0000 ug/min | INTRAVENOUS | Status: DC
  Administered 2022-10-05: 46 ug/min via INTRAVENOUS
  Administered 2022-10-06: 45 ug/min via INTRAVENOUS
  Administered 2022-10-06: 49 ug/min via INTRAVENOUS
  Administered 2022-10-06: 53 ug/min via INTRAVENOUS
  Filled 2022-10-05 (×4): qty 250

## 2022-10-05 MED ORDER — ALBUMIN HUMAN 25 % IV SOLN
25.0000 g | Freq: Three times a day (TID) | INTRAVENOUS | Status: DC
  Filled 2022-10-05: qty 100

## 2022-10-05 MED ORDER — PROSOURCE TF20 ENFIT COMPATIBL EN LIQD
60.0000 mL | Freq: Four times a day (QID) | ENTERAL | Status: DC
  Administered 2022-10-05 (×3): 60 mL
  Filled 2022-10-05 (×3): qty 60

## 2022-10-05 MED ORDER — ALBUMIN HUMAN 25 % IV SOLN
25.0000 g | Freq: Three times a day (TID) | INTRAVENOUS | Status: AC
  Administered 2022-10-05 – 2022-10-06 (×3): 25 g via INTRAVENOUS
  Filled 2022-10-05 (×2): qty 100

## 2022-10-05 MED ORDER — FENTANYL 2500MCG IN NS 250ML (10MCG/ML) PREMIX INFUSION
0.0000 ug/h | INTRAVENOUS | Status: DC
  Administered 2022-10-05: 25 ug/h via INTRAVENOUS
  Filled 2022-10-05: qty 250

## 2022-10-05 MED ORDER — ACETAMINOPHEN 160 MG/5ML PO SOLN
650.0000 mg | Freq: Four times a day (QID) | ORAL | Status: DC | PRN
  Administered 2022-10-06: 650 mg
  Filled 2022-10-05: qty 20.3

## 2022-10-05 MED ORDER — VITAL 1.5 CAL PO LIQD
1000.0000 mL | ORAL | Status: DC
  Administered 2022-10-05: 1000 mL
  Filled 2022-10-05 (×3): qty 1000

## 2022-10-05 MED ORDER — NOREPINEPHRINE 16 MG/250ML-% IV SOLN
0.0000 ug/min | INTRAVENOUS | Status: DC
  Administered 2022-10-05: 35 ug/min via INTRAVENOUS
  Filled 2022-10-05: qty 250

## 2022-10-05 NOTE — Progress Notes (Signed)
ANTICOAGULATION CONSULT NOTE - Follow Up Consult  Pharmacy Consult for Heparin  Indication: atrial fibrillation and pulmonary embolus  No Known Allergies  Patient Measurements: Height:  (195.6 cm) (Per family) Weight: (!) 157.5 kg (347 lb 3.6 oz) IBW/kg (Calculated) : 89.1 Heparin Dosing Weight: 125 kg (updated 4/15)  Vital Signs: Temp: 102.8 F (39.3 C) (04/14 2338) Temp Source: Axillary (04/14 2338) BP: 109/70 (04/15 0615) Pulse Rate: 87 (04/14 2338)  Labs: Recent Labs    11/01/22 1455 2022/11/01 1455 Nov 01, 2022 1705 2022/11/01 1711 10/03/22 0600 10/03/22 0940 10/03/22 0955 10/03/22 1013 10/03/22 1558 10/04/22 0239 10/04/22 0427 10/04/22 0844 10/05/22 0522 10/05/22 0523  HGB 13.1  --   --   --  12.9*  --   --   --   --   --  13.5  --  13.2  --   HCT 42.0  --   --   --  42.6  --   --   --   --   --  43.3  --  42.0  --   PLT 193  --   --   --  180  --   --   --   --   --  267  --  204  --   LABPROT  --   --  17.0*  --   --   --   --   --   --   --   --   --   --   --   INR  --   --  1.4*  --   --   --   --   --   --   --   --   --   --   --   HEPARINUNFRC  --   --   --   --  <0.10*  --    < >  --    < > 0.33  --  0.37  --  0.33  CREATININE  --    < > 0.54*  --   --   --   --  0.77  --   --  1.26*  --  1.35*  --   TROPONINIHS 15  --   --  11  --  15  --   --   --   --   --   --   --   --    < > = values in this interval not displayed.    Estimated Creatinine Clearance: 92.3 mL/min (A) (by C-G formula based on SCr of 1.35 mg/dL (H)).   Medications:  Infusions:   sodium chloride 75 mL/hr at 10/05/22 1610   sodium chloride 10 mL/hr at 10/05/22 9604   amiodarone Stopped (10/05/22 0621)   azithromycin Stopped (10/04/22 1926)   ceFEPime (MAXIPIME) IV Stopped (10/05/22 0548)   dexmedetomidine (PRECEDEX) IV infusion 1.1 mcg/kg/hr (10/05/22 0731)   heparin 2,800 Units/hr (10/05/22 5409)   norepinephrine (LEVOPHED) Adult infusion 30 mcg/min (10/05/22 0729)    vasopressin 0.03 Units/min (10/05/22 8119)    Assessment: Pt is a 11 yoM with recent diagnosis of pancreatic cancer with mets to the liver. Pt recently diagnosed with atrial fibrillation, not on anticoagulant PTA. Pt was scheduled for port placement on 11-01-2022 - PAC placement delayed and pt sent to ED for afib with RVR.    11/01/22 CTA: Small bilateral nonocclusive PE, no right heart strain. Pharmacy consulted to dose heparin for PE and atrial fibrillation. Pt intubated  4/13.   Today, 10/05/2022: Heparin level 0.33, therapeutic on heparin at 2800 units/hr CBC: Hgb 13.2, Plt 204k No bleeding or complications noted per RN SCr increasing: 0.54 >> 1.35 LFTs rising: 52/35 >> 205/165, Tbili up to 6.4  Goal of Therapy:  Heparin level 0.3-0.7 units/ml Monitor platelets by anticoagulation protocol: Yes   Plan:  Continue heparin IV infusion at 2800 units/hr Daily heparin level and CBC  Lynann Beaver PharmD, BCPS [facility] main pharmacy [phone] 10/05/2022,7:06 AM

## 2022-10-05 NOTE — H&P (Signed)
NAME:  Craig Cole, MRN:  [id number], DOB:  05/10/1961, LOS: 2 ADMISSION DATE:  10/08/2022, CONSULTATION DATE:  10/03/2022 REFERRING MD:  Dr [place], CHIEF COMPLAINT:  Respiratory failure   History of Present Illness:  Patient with a history of stage IV metastatic pancreatic cancer with liver mets followed by Dr. Ellin Saba who was scheduled for port placement next, noted to be in A-fib with RVR.  Referred to the hospital for further evaluation and management Found to have pulmonary embolism for which she was started on anticoagulation Oxygen requirement worsening, agitation, altered mental status Background history of asthma, GERD, hypertension, hyperlipidemia Diagnosed about 2 weeks ago with A-fib   For his pancreatic cancer, has not started therapy yet but was scheduled for therapy following port placement   Spoke with patient and his sister He was feeling relatively well may be slower than his usual prior to showing up for port placement Did not have specific complaints  Pertinent  Medical History   Past Medical History:  Diagnosis Date   Cancer (HCC)    skin cancer   GERD (gastroesophageal reflux disease)    Glaucoma    High risk sexual behavior    on PrEP for HIV   Hyperlipidemia    Hypertension    Sleep apnea    Significant Hospital Events: Including procedures, antibiotic start and stop dates in addition to other pertinent events   4/13 CT head with no significant intracranial abnormality, 1.1 cm calcified extra-axial mass felt to be related to small calcified meningioma 4/12-progressive hepatic metastatic disease, small ascites, 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 rate (!) 35, height  (1.956 m), weight (!) 157.5 kg, SpO2 99 %. CVP:  [11 mmHg-12 mmHg] 11 mmHg  Vent Mode: PRVC FiO2 (%):  [40 %] 40 % Set Rate:  [20 bmp] 20 bmp Vt Set:  [650 mL] 650 mL PEEP:  [8 cmH20] 8 cmH20 Plateau Pressure:  [20 cmH20-21 cmH20] 20 cmH20   Intake/Output Summary (Last 24 hours) at 10/05/2022 0819 Last data filed at 10/05/2022 4098 Gross per 24 hour  Intake 6814.84 ml  Output 1125 ml  Net 5689.84 ml   Filed Weights   10/03/22 1537 10/04/22 0700 10/05/22 0500  Weight: (!) 140.1 kg (!) 153.2 kg (!) 157.5 kg    Examination: General: Middle-aged, does not appear to be in distress HENT: Dry oral mucosa Lungs: Decreased air movement bilaterally worse on the right Cardiovascular: S1-S2 appreciated Abdomen: Soft, bowel sounds appreciated Extremities: No clubbing, no edema, chronic venous stasis changes Neuro: Sedated on vent GU:   Resolved Hospital Problem list     Assessment & Plan:   Hypoxemic respiratory failure --mechanical ventilation -Target TVol 6-8cc/kgIBW -Target Plateau Pressure < 30cm H20 -Target driving pressure less than 15 cm of water -target PaO2 55-65: titrate PEEP/FiO2 per protocol -Ventilator associated pneumonia prevention protocol  Atrial fibrillation with RVR -On amiodarone -On anticoagulation  Bilateral pulmonary embolism -On heparin  Metastatic pancreatic cancer you -Yet to start therapy -This appears  to be aggressive with progressive disease -Will reach out to oncology  Right pleural effusion Atelectasis -Concern for pneumonia -On antibiotics at present -Patient was not having significant respiratory symptoms prior to  coming into the hospital -Chest x-ray pending today  Acute liver injury Transaminitis Patient has a very high metastatic burden to the liver  Leukocytosis -Appears to have stabilized -On empiric antibiotics-on azithromycin and cefepime, vancomycin discontinued with negative nasal PCR -Antibiotics was escalated 10/04/2022 -May be related to his neoplastic process however antibiotic coverage will continue at present  Acute metabolic encephalopathy -Multifactorial -Ammonia level improving  History of asthma -Bronchodilators as needed  On Descovy for high risk sexual behavior on PR EP therapy  Does have a history of obstructive sleep apnea for which he was on CPAP therapy at home  It was discussed extensively with his sister at bedside on 10/04/2022 The goal will be to try and wean him today off sedation to see what he is able to do -He is not an extubation candidate at present  Goals of care discussion is likely appropriate in this situation  Best Practice (right click and "Reselect all SmartList Selections" daily)   Diet/type: tubefeeds DVT prophylaxis: systemic heparin GI prophylaxis: PPI Lines: Central line Foley:  Yes, and it is still needed Code Status:  full code Last date of multidisciplinary goals of care discussion [discussed with sister at bedside 10/03/2022]  Labs   CBC: Recent Labs  Lab 09/24/2022 1455 10/03/22 0600 10/04/22 0427 10/05/22 0522  WBC 41.6* 48.7* 55.9* 55.7*  NEUTROABS 34.8* 39.1*  --   --   HGB 13.1 12.9* 13.5 13.2  HCT 42.0 42.6 43.3 42.0  MCV 86.6 87.8 88.0 87.3  PLT 193 180 267 204    Basic Metabolic Panel: Recent Labs  Lab 10/17/2022 1705 10/03/22 1013 10/04/22 0427 10/04/22 0958 10/04/22 1708 10/05/22 0522  NA 144 141 138  --   --  132*  K 2.4* 3.7 3.6  --   --  3.5  CL 122* 113* 110  --   --  103  CO2 18* 24 21*  --   --  19*  GLUCOSE 87 117* 125*  --   --  168*  BUN 28* 36* 44*  --   --  51*  CREATININE 0.54* 0.77 1.26*   --   --  1.35*  CALCIUM 8.7* 12.6* 11.9*  --   --  10.1  MG  --  2.1  --  2.0 2.0 2.1  PHOS  --   --   --  2.5 2.5 2.2*   GFR: Estimated Creatinine Clearance: 92.3 mL/min (A) (by C-G formula based on SCr of 1.35 mg/dL (H)). Recent Labs  Lab 09/26/2022 1455 10/16/2022 2035 10/03/22 0600 10/03/22 1231 10/04/22 0427 10/05/22 0522  WBC 41.6*  --  48.7*  --  55.9* 55.7*  LATICACIDVEN  --  1.8  --  2.4*  --   --     Liver Function Tests: Recent Labs  Lab 10/12/2022 1705 10/03/22 1013 10/05/22 0522  AST 52* 91* 205*  ALT 35 59* 165*  ALKPHOS 207* 294* 214*  BILITOT 2.1* 4.4* 6.4*  PROT 3.7* 5.6* 5.1*  ALBUMIN <1.5* 2.0* 1.6*   No results for input(s): "LIPASE", "AMYLASE" in the last 168 hours. Recent Labs  Lab 10/03/22 1013  AMMONIA 38*    ABG    Component Value Date/Time   PHART 7.42 10/03/2022 1528   PCO2ART 38 10/03/2022 1528   PO2ART 199 (H)  10/03/2022 1528   HCO3 24.7 10/03/2022 1528   O2SAT 99.6 10/03/2022 1528     Coagulation Profile: Recent Labs  Lab 10/04/2022 1705  INR 1.4*    Cardiac Enzymes: No results for input(s): "CKTOTAL", "CKMB", "CKMBINDEX", "TROPONINI" in the last 168 hours.  HbA1C: No results found for: "HGBA1C"  CBG: Recent Labs  Lab 10/04/22 1235 10/04/22 1126 10/04/22 1532 10/04/22 1945  GLUCAP 127* 131* 143* 177*    Review of Systems:   Fever overnight  Past Medical History:  He,  has a past medical history of Cancer (HCC), GERD (gastroesophageal reflux disease), Glaucoma, High risk sexual behavior, Hyperlipidemia, Hypertension, and Sleep apnea.   Surgical History:   Past Surgical History:  Procedure Laterality Date   COLONOSCOPY  05/25/2012   [facility]; rare diverticulum.   COLONOSCOPY WITH PROPOFOL N/A 04/28/2021   Procedure: COLONOSCOPY WITH PROPOFOL;  Surgeon: [name], MD;  Location: [facility];  Service: Endoscopy;  Laterality: N/A;  8:45am   POLYPECTOMY  04/28/2021    Procedure: POLYPECTOMY;  Surgeon: [name], MD;  Location: [facility];  Service: Endoscopy;;   RETINAL DETACHMENT SURGERY Left    TONSILLECTOMY       Social History:   reports that he quit smoking about 25 years ago. His smoking use included cigarettes. He smoked an average of 1 pack per day. He has never used smokeless tobacco. He reports that he does not currently use alcohol. He reports that he does not use drugs.   Family History:  His family history includes Diabetes in his mother; Hypertension in his mother; Myasthenia gravis in his father; Prostate cancer in his brother. There is no history of Colon cancer.   Allergies No Known Allergies   The patient is critically ill with multiple organ systems failure and requires high complexity decision making for assessment and support, frequent evaluation and titration of therapies, application of advanced monitoring technologies and extensive interpretation of multiple databases. Critical Care Time devoted to patient care services described in this note independent of APP/resident time (if applicable)  is 32 minutes.   [name] MD [facility] Personal pager: See Amion If unanswered, please page CCM On-call: #[phone]

## 2022-10-05 NOTE — Progress Notes (Signed)
eLink Physician-Brief Progress Note Patient Name: Craig Cole DOB: September 25, 1960 MRN: [id number]   Date of Service  10/05/2022  HPI/Events of Note  Patient needs restraints order renewed. Sub-optimal sedation with biting ET tube. Patient needs ceiling on Levophed raised.  eICU Interventions  Restraints renewed, Levophed ceiling increased to 60, Fentanyl gtt ordered.        [name] 10/05/2022, 8:43 PM

## 2022-10-05 NOTE — Progress Notes (Signed)
Initial Nutrition Assessment  DOCUMENTATION CODES:   Obesity unspecified  INTERVENTION:  - Per CCM MD, can advance tube feeds towards goal today: Initiate tube feeding via OG: Vital 1.5 at 55 ml/h (1320 ml per day) *Start at 47mL/hr and advance by 31mL Q12H Prosource TF20 60 ml QID Provides 2300 kcal, 169 gm protein, 1008 ml free water daily  - Monitor magnesium, potassium, and phosphorus for at least 3 days, MD to replete as needed.  - FWF per CCM/MD.   - Monitor weight trends.    NUTRITION DIAGNOSIS:   Inadequate oral intake related to inability to eat as evidenced by NPO status.  GOAL:   Patient will meet greater than or equal to 90% of their needs  MONITOR:   Labs, TF tolerance, Vent status  REASON FOR ASSESSMENT:   Consult Enteral/tube feeding initiation and management  ASSESSMENT:   62 y.o. male with PMH stage IV metastatic pancreatic cancer with liver mets who presented with afib rvr and bilateral pulmonary emboli.   4/13 Admit, Intubated 4/14 Adult TF protocol started  Patient intubated and sedated at time of visit.  Sister and friend at bedside provided all nutrition history.   Sister reports patient's UBW to be around 330#. Notes he had lost about 7# over the past 2 weeks due to being sick and not eating well prior to admit.  Per EMR, patient weighed at 336# in the middle of March and weighed as low as 308# this admit. This could suggest weight loss however weights since admission vary from 308-347# so current weight status difficult to assess. Patient also experiencing edema and is +13L so weight likely affected by fluid at this time.   Sister reports patient typically eats 3 meals a day plus snacks. Had been trying to lose some weight. However, over the past 2 weeks since feeling sick, his appetite was decreased and he wasn't eating a lot. Mostly drinking Body Armor and Ensure Max (2-4/day).   OG tube placed 4/13, xray read recommended advancement by  5-10cm, which was done.  Patient started on TF of Vital HP at 14mL/hr on 4/14. Appears to be tolerating well.   Patient discussed with CCM Dr. Wynona Neat. Per MD, can advance tube feeds towards goal today.  Plan to change to Vital 1.5, starting at 48mL/hr and advancing by 20mL Q12H to goal of 68mL/hr.  Plan discussed with RN.    Medications reviewed and include: Colace, Miralax Levophed Vasopressin  Labs reviewed:  Na 132 Creatinine 1.35 Phosphorus 2.2   NUTRITION - FOCUSED PHYSICAL EXAM:  Flowsheet Row Most Recent Value  Orbital Region No depletion  Upper Arm Region No depletion  Thoracic and Lumbar Region No depletion  Buccal Region No depletion  Temple Region Mild depletion  Clavicle Bone Region No depletion  Clavicle and Acromion Bone Region No depletion  Scapular Bone Region Unable to assess  Dorsal Hand No depletion  Patellar Region No depletion  Anterior Thigh Region No depletion  Posterior Calf Region No depletion  Edema (RD Assessment) None  Hair Reviewed  Eyes Unable to assess  Mouth Unable to assess  Skin Reviewed  Nails Reviewed       Diet Order:   Diet Order             Diet NPO time specified  Diet effective now                   EDUCATION NEEDS:  Not appropriate for education at this time  Skin:  Skin Assessment: Reviewed RN Assessment  Last BM:  PTA  Height:  Ht Readings from Last 1 Encounters:  10/03/22  (1.956 m)   Weight:  Wt used for calculations:  10/03/22 (!) 140.1 kg   Ideal Body Weight:  94.5 kg  BMI:  Body mass index is 36.62 kg/m.  Estimated Nutritional Needs:  Kcal:  2000-2400 kcals Protein:  160-190 grams Fluid:  >/= 2L    Shelle Iron RD, LDN For contact information, refer to [facility].

## 2022-10-05 NOTE — Progress Notes (Signed)
eLink Physician-Brief Progress Note Patient Name: Craig Cole DOB: 10-Jun-1961 MRN: [id number]   Date of Service  10/05/2022  HPI/Events of Note  Ongoing fever.  Up to 103 F.  Not responding to Tylenol and ice packs.  eICU Interventions  Cooling blanket ordered New blood cultures ordered. Discussed with bedside RN.        Carilyn Goodpasture 10/05/2022, 4:48 AM

## 2022-10-06 ENCOUNTER — Inpatient Hospital Stay (HOSPITAL_COMMUNITY): Payer: No Typology Code available for payment source

## 2022-10-06 DIAGNOSIS — I4891 Unspecified atrial fibrillation: Secondary | ICD-10-CM | POA: Diagnosis not present

## 2022-10-06 LAB — GLUCOSE, CAPILLARY
Glucose-Capillary: 124 mg/dL — ABNORMAL HIGH (ref 70–99)
Glucose-Capillary: 104 mg/dL — ABNORMAL HIGH (ref 70–99)
Glucose-Capillary: 120 mg/dL — ABNORMAL HIGH (ref 70–99)
Glucose-Capillary: 127 mg/dL — ABNORMAL HIGH (ref 70–99)

## 2022-10-06 LAB — CBC
HCT: 36.2 % — ABNORMAL LOW (ref 39.0–52.0)
Hemoglobin: 11.5 g/dL — ABNORMAL LOW (ref 13.0–17.0)
MCH: 27.5 pg (ref 26.0–34.0)
MCHC: 31.8 g/dL (ref 30.0–36.0)
MCV: 86.6 fL (ref 80.0–100.0)
Platelets: 128 10*3/uL — ABNORMAL LOW (ref 150–400)
RBC: 4.18 MIL/uL — ABNORMAL LOW (ref 4.22–5.81)
RDW: 20.6 % — ABNORMAL HIGH (ref 11.5–15.5)
WBC: 49.6 10*3/uL — ABNORMAL HIGH (ref 4.0–10.5)
nRBC: 0.1 % (ref 0.0–0.2)

## 2022-10-06 LAB — COMPREHENSIVE METABOLIC PANEL
ALT: 141 U/L — ABNORMAL HIGH (ref 0–44)
AST: 135 U/L — ABNORMAL HIGH (ref 15–41)
Albumin: 1.6 g/dL — ABNORMAL LOW (ref 3.5–5.0)
Alkaline Phosphatase: 153 U/L — ABNORMAL HIGH (ref 38–126)
Anion gap: 11 (ref 5–15)
BUN: 77 mg/dL — ABNORMAL HIGH (ref 8–23)
CO2: 18 mmol/L — ABNORMAL LOW (ref 22–32)
Calcium: 9 mg/dL (ref 8.9–10.3)
Chloride: 105 mmol/L (ref 98–111)
Creatinine, Ser: 1.88 mg/dL — ABNORMAL HIGH (ref 0.61–1.24)
GFR, Estimated: 40 mL/min — ABNORMAL LOW (ref >60)
Glucose, Bld: 148 mg/dL — ABNORMAL HIGH (ref 70–99)
Potassium: 3.8 mmol/L (ref 3.5–5.1)
Sodium: 134 mmol/L — ABNORMAL LOW (ref 135–145)
Total Bilirubin: 6.7 mg/dL — ABNORMAL HIGH (ref 0.3–1.2)
Total Protein: 4.6 g/dL — ABNORMAL LOW (ref 6.5–8.1)

## 2022-10-06 LAB — BLOOD GAS, ARTERIAL
Acid-base deficit: 11 mmol/L — ABNORMAL HIGH (ref 0.0–2.0)
Bicarbonate: 14.6 mmol/L — ABNORMAL LOW (ref 20.0–28.0)
Drawn by: 25770
FIO2: 40 %
MECHVT: 650 mL
O2 Saturation: 98.4 %
PEEP: 8 cmH2O
Patient temperature: 35.8
RATE: 20 resp/min
pCO2 arterial: 29 mmHg — ABNORMAL LOW (ref 32–48)
pH, Arterial: 7.3 — ABNORMAL LOW (ref 7.35–7.45)
pO2, Arterial: 79 mmHg — ABNORMAL LOW (ref 83–108)

## 2022-10-06 LAB — CULTURE, BLOOD (ROUTINE X 2)
Special Requests: ADEQUATE
Special Requests: ADEQUATE

## 2022-10-06 LAB — CULTURE, RESPIRATORY W GRAM STAIN

## 2022-10-06 LAB — APTT: aPTT: >200 seconds (ref 24–36)

## 2022-10-06 LAB — HEPARIN LEVEL (UNFRACTIONATED): Heparin Unfractionated: 0.52 IU/mL (ref 0.30–0.70)

## 2022-10-06 MED ORDER — MIDAZOLAM-SODIUM CHLORIDE 100-0.9 MG/100ML-% IV SOLN
0.0000 mg/h | INTRAVENOUS | Status: DC
  Administered 2022-10-06: 1 mg/h via INTRAVENOUS
  Filled 2022-10-06: qty 100

## 2022-10-06 MED ORDER — POLYVINYL ALCOHOL 1.4 % OP SOLN: 1.0000 [drp] | Freq: Four times a day (QID) | OPHTHALMIC | Status: DC | PRN

## 2022-10-06 MED ORDER — PANTOPRAZOLE SODIUM 40 MG IV SOLR: 40.0000 mg | Freq: Two times a day (BID) | INTRAVENOUS | Status: DC

## 2022-10-06 MED ORDER — SODIUM CHLORIDE 0.9 % IV SOLN
200.0000 mg | INTRAVENOUS | Status: DC
  Administered 2022-10-06: 200 mg via INTRAVENOUS
  Filled 2022-10-06 (×2): qty 10

## 2022-10-06 MED ORDER — MIDAZOLAM BOLUS VIA INFUSION (WITHDRAWAL LIFE SUSTAINING TX): 2.0000 mg | INTRAVENOUS | Status: DC | PRN

## 2022-10-06 MED ORDER — SODIUM CHLORIDE 0.9 % IV SOLN: 500.0000 mg | Freq: Three times a day (TID) | INTRAVENOUS | Status: DC

## 2022-10-06 MED ORDER — GLYCOPYRROLATE 0.2 MG/ML IJ SOLN: 0.2000 mg | INTRAMUSCULAR | Status: DC | PRN

## 2022-10-06 MED ORDER — FENTANYL BOLUS VIA INFUSION
100.0000 ug | INTRAVENOUS | Status: DC | PRN
  Administered 2022-10-06: 100 ug via INTRAVENOUS

## 2022-10-06 MED ORDER — SODIUM CHLORIDE 0.9 % IV SOLN: INTRAVENOUS | Status: DC

## 2022-10-06 MED ORDER — PANTOPRAZOLE 80MG IVPB - SIMPLE MED
80.0000 mg | Freq: Once | INTRAVENOUS | Status: AC
  Administered 2022-10-06: 80 mg via INTRAVENOUS
  Filled 2022-10-06: qty 80

## 2022-10-06 MED ORDER — ACETAMINOPHEN 325 MG PO TABS: 650.0000 mg | ORAL_TABLET | Freq: Four times a day (QID) | ORAL | Status: DC | PRN

## 2022-10-06 MED ORDER — FENTANYL 2500MCG IN NS 250ML (10MCG/ML) PREMIX INFUSION
0.0000 ug/h | INTRAVENOUS | Status: DC
  Administered 2022-10-06: 100 ug/h via INTRAVENOUS
  Filled 2022-10-06: qty 250

## 2022-10-06 MED ORDER — VANCOMYCIN HCL 2000 MG/400ML IV SOLN
2000.0000 mg | Freq: Once | INTRAVENOUS | Status: AC
  Administered 2022-10-06: 2000 mg via INTRAVENOUS
  Filled 2022-10-06: qty 400

## 2022-10-06 MED ORDER — GLYCOPYRROLATE 1 MG PO TABS: 1.0000 mg | ORAL_TABLET | ORAL | Status: DC | PRN

## 2022-10-06 MED ORDER — MIDAZOLAM HCL 2 MG/2ML IJ SOLN: 1.0000 mg | INTRAMUSCULAR | Status: DC | PRN

## 2022-10-06 MED ORDER — SODIUM CHLORIDE 0.9 % IV SOLN
1000.0000 mg | Freq: Three times a day (TID) | INTRAVENOUS | Status: DC
  Administered 2022-10-06 (×2): 1000 mg via INTRAVENOUS
  Filled 2022-10-06 (×5): qty 20

## 2022-10-06 MED ORDER — VANCOMYCIN HCL 1750 MG/350ML IV SOLN: 1750.0000 mg | Freq: Two times a day (BID) | INTRAVENOUS | Status: DC

## 2022-10-06 MED ORDER — GLYCOPYRROLATE 0.2 MG/ML IJ SOLN
0.2000 mg | INTRAMUSCULAR | Status: DC | PRN
  Administered 2022-10-06: 0.2 mg via INTRAVENOUS
  Filled 2022-10-06: qty 1

## 2022-10-06 MED ORDER — VANCOMYCIN HCL 1750 MG/350ML IV SOLN
1750.0000 mg | INTRAVENOUS | Status: DC
  Filled 2022-10-06: qty 350

## 2022-10-06 MED ORDER — HEPARIN (PORCINE) 25000 UT/250ML-% IV SOLN: 2400.0000 [IU]/h | INTRAVENOUS | Status: DC

## 2022-10-06 MED ORDER — PANTOPRAZOLE INFUSION (NEW) - SIMPLE MED
8.0000 mg/h | INTRAVENOUS | Status: DC
  Administered 2022-10-06: 8 mg/h via INTRAVENOUS
  Filled 2022-10-06: qty 80

## 2022-10-06 MED ORDER — ACETAMINOPHEN 650 MG RE SUPP: 650.0000 mg | Freq: Four times a day (QID) | RECTAL | Status: DC | PRN

## 2022-10-07 ENCOUNTER — Institutional Professional Consult (permissible substitution): Payer: BC Managed Care – PPO | Admitting: Internal Medicine

## 2022-10-07 ENCOUNTER — Ambulatory Visit: Payer: BC Managed Care – PPO | Admitting: Hematology

## 2022-10-07 LAB — BLOOD CULTURE ID PANEL (REFLEXED) - BCID2

## 2022-10-07 LAB — CULTURE, BLOOD (ROUTINE X 2)

## 2022-10-08 LAB — CULTURE, BLOOD (ROUTINE X 2)

## 2022-10-09 ENCOUNTER — Other Ambulatory Visit: Payer: Self-pay | Admitting: Hematology

## 2022-10-09 DIAGNOSIS — C259 Malignant neoplasm of pancreas, unspecified: Secondary | ICD-10-CM

## 2022-10-09 LAB — CULTURE, BLOOD (ROUTINE X 2)

## 2022-10-10 LAB — CULTURE, BLOOD (ROUTINE X 2)
Culture: NO GROWTH
Culture: NO GROWTH
Culture: NO GROWTH

## 2022-10-11 LAB — CULTURE, BLOOD (ROUTINE X 2)

## 2022-10-13 ENCOUNTER — Encounter (HOSPITAL_COMMUNITY): Payer: Self-pay

## 2022-10-16 ENCOUNTER — Other Ambulatory Visit: Payer: BC Managed Care – PPO

## 2022-10-19 ENCOUNTER — Ambulatory Visit: Payer: BC Managed Care – PPO | Admitting: Hematology

## 2022-10-19 ENCOUNTER — Ambulatory Visit: Payer: BC Managed Care – PPO

## 2022-10-20 ENCOUNTER — Ambulatory Visit: Payer: BC Managed Care – PPO | Admitting: Internal Medicine

## 2022-10-21 NOTE — TOC Initial Note (Signed)
Transition of Care [facility]) - Initial/Assessment Note    Patient Details  Name: Craig Cole MRN: [id number] Date of Birth: 10/16/1960  [facility]) CM/SW Contact:    Lavenia Atlas, RN Phone Number: 09/25/2022, 5:12 PM  Clinical Narrative:                 Per chart review patient had scheduled port placement, however emergent admit to [facility] ICU. PMH: stage IV metastatic pancreatic cancer with liver mets. Patient transitioned to comfort care.  No TOC consult on file, will await TOC consult for potential hospice.  Following for TOC needs.      Barriers to Discharge: Continued Medical Work up   Patient Goals and CMS Choice Patient states their goals for this hospitalization and ongoing recovery are:: patient currently sedated/ intubated          Expected Discharge Plan and Services   Discharge Planning Services: CM Consult   Living arrangements for the past 2 months: Single Family Home                   DME Agency: NA       HH Arranged: NA HH Agency: NA        Prior Living Arrangements/Services Living arrangements for the past 2 months: Single Family Home Lives with:: Relatives                   Activities of Daily Living      Permission Sought/Granted                  Emotional Assessment Appearance:: Appears stated age Attitude/Demeanor/Rapport: Unable to Assess Affect (typically observed): Unable to Assess     Psych Involvement: No (comment)  Admission diagnosis:  Atrial fibrillation with RVR [I48.91] Acute hypoxemic respiratory failure [J96.01] Other pulmonary embolism without acute cor pulmonale, unspecified chronicity [I26.99] Patient Active Problem List   Diagnosis Date Noted   Acute hypoxemic respiratory failure 10/03/2022   Pancreatic adenocarcinoma 09/23/2022   Atrial fibrillation with RVR 09/15/2022   Pancreatic mass 09/15/2022   Essential hypertension 09/15/2022   Hyperlipidemia 09/15/2022   GERD (gastroesophageal  reflux disease) 09/15/2022   Asthma, chronic 09/15/2022   Leukocytosis 09/15/2022   Obesity, Class II, BMI 35-39.9 08/31/2022   OSA on CPAP 08/31/2022   Chest pain at rest 08/31/2022   Orthopnea 08/31/2022   Fatigue 08/31/2022   History of colon polyps 03/27/2021   On pre-exposure prophylaxis for HIV 09/10/2020   High risk sexual behavior 09/10/2020   PCP:  Benetta Spar, MD Pharmacy:   [facility] [phone] - [place], [place] - [address] AT [facility] OF [address] [place] [address] Phone: [phone] Fax: [phone]  [facility] - [place] - [address] [place] [address] Phone: [phone] Fax: [phone]     Social Determinants of Health (SDOH) Social History: SDOH Screenings   Food Insecurity: No Food Insecurity (09/24/2022)  Housing: Low Risk  (09/24/2022)  Transportation Needs: No Transportation Needs (09/24/2022)  Utilities: Not At Risk (09/24/2022)  Depression (PHQ2-9): Low Risk  (09/24/2022)  Tobacco Use: Medium Risk (09/15/2022)   SDOH Interventions:     Readmission Risk Interventions     No data to display

## 2022-10-21 NOTE — Progress Notes (Signed)
Following discussions with oncology and conversations with family members  Patient will be transition to comfort measures

## 2022-10-21 NOTE — Death Summary Note (Signed)
DEATH SUMMARY   Patient Details  Name: Craig Cole MRN: [id number] DOB: 1960/10/24  Admission/Discharge Information   Admit Date:  2022-10-29  Date of Death: Date of Death: 11-02-22  Time of Death: Time of Death: 11-24-38  Length of Stay: 3  Referring Physician: Benetta Spar, MD   Reason(s) for Hospitalization  Atrial fibrillation with RVR  Diagnoses  Preliminary cause of death:  Metastatic pancreatic cancer Secondary Diagnoses (including complications and co-morbidities):  Principal Problem:   Atrial fibrillation with RVR Active Problems:   Acute hypoxemic respiratory failure Liver failure Malignant hypercalcemia Sepsis Pleural effusion Acute kidney injury Metabolic encephalopathy History of asthma pulmonary embolism  Brief Hospital Course (including significant findings, care, treatment, and services provided and events leading to death)  [name] is a 62 y.o. year old male who was being managed for pancreatic cancer with metastatic deposits to the liver.  Was recently hospitalized for atrial fibrillation. Came in for port placement and was noted to be in A-fib with RVR.  It was sent to the emergency room for evaluation and further management.  Patient decompensated while in the emergency department with worsening shortness of breath, a CT scan of the chest was performed which showed bilateral PE.  Patient with acute respiratory failure with hypercapnia.  Decision was made to intubate the patient for respiratory support Empiric antibiotics initiated, anticoagulation started, started on amiodarone. Patient further decompensated starting to require pressors with escalation in pressor requirement.  Discussions daily with patient's sister.  With progressive disease Oncology was involved with patient management and following discussion with oncology, decision was made to transition to comfort measures.  Patient was made DO NOT RESUSCITATE status, transition to  comfort measures. Patient succumbed to his illness at Nov 24, 2038 on 2022-11-02  Cause of death metastatic pancreatic cancer  Pertinent Labs and Studies  Significant Diagnostic Studies DG Abd 1 View  Result Date: 11-02-2022 CLINICAL DATA:  62 year old male with history of ileus. EXAM: ABDOMEN - 1 VIEW COMPARISON:  Abdominal radiograph 10/03/2022. FINDINGS: Tip of nasogastric tube is in the antral pre-pyloric region of the stomach. Visualized bowel gas pattern is nonobstructive. No definite pneumoperitoneum noted on this single image. IMPRESSION: 1. Support apparatus, as above. 2. Unremarkable bowel gas pattern. Electronically Signed   By: Trudie Reed M.D.   On: Nov 02, 2022 05:55   DG Chest Port 1 View  Result Date: 2022/11/02 CLINICAL DATA:  62 year old male with history of acute respiratory failure. EXAM: PORTABLE CHEST 1 VIEW COMPARISON:  Chest x-ray 10/05/2022. FINDINGS: An endotracheal tube is in place with tip 5.1 cm above the carina. There is a right-sided internal jugular central venous catheter with tip terminating in the superior cavoatrial junction. A nasogastric tube is seen extending into the stomach, however, the tip of the nasogastric tube extends below the lower margin of the image. Lung volumes are low. Poorly defined opacities throughout the right mid to lower lung, particularly medially. Left lung appears clear. No pleural effusions. No pneumothorax. No evidence of pulmonary edema. Heart size appears borderline enlarged. Upper mediastinal contours are distorted by patient's rotation to the right. IMPRESSION: 1. Support apparatus, as above. 2. Low lung volumes with ill-defined opacity throughout the right mid to lower lung concerning for developing atelectasis and/or consolidation. Electronically Signed   By: Trudie Reed M.D.   On: 2022/11/02 05:54   DG CHEST PORT 1 VIEW  Result Date: 10/05/2022 CLINICAL DATA:  Respiratory failure EXAM: PORTABLE CHEST 1 VIEW COMPARISON:  CXR  10/03/22 FINDINGS: Assessment is slightly  limited due to poor penetration. Endotracheal tube is positioned in the midtrachea. Right-sided central venous catheter with the tip near the cavoatrial junction. Enteric tube courses below the diaphragm with the side hole and tip poorly visualized. Small right pleural effusion asymmetric elevation right hemidiaphragm. Unchanged cardiac and mediastinal contours. Hazy opacity at the right lung base could represent atelectasis or infection. No radiographically apparent displaced rib fractures. Visualized upper abdomen is unremarkable., IMPRESSION: 1. Small right pleural effusion, slightly increased from prior exam. 2. Hazy opacity at the right lung base could represent atelectasis or infection. 3. Endotracheal tube is below the thoracic inlet and above the carina. Enteric tube is poorly visualized due to technique. Electronically Signed   By: Lorenza Cambridge M.D.   On: 10/05/2022 13:04   DG CHEST PORT 1 VIEW  Result Date: 10/03/2022 CLINICAL DATA:  Central line placement EXAM: PORTABLE CHEST 1 VIEW COMPARISON:  Chest x-ray October 03, 2022 FINDINGS: Interval placement of a right approach central venous catheter, terminating in the right atrium. The endotracheal tube terminates at the level of the thoracic inlet. NG tube in place, terminating below the field of view. The cardiomediastinal silhouette is unchanged in contour. Persistent elevation of the right hemidiaphragm. Unchanged right basilar opacity, likely atelectasis. No new focal pulmonary opacity. No pleural effusion or pneumothorax. The visualized upper abdomen is unremarkable. No acute osseous abnormality. IMPRESSION: 1. Interval placement of a right central venous catheter, terminating in the right atrium. 2. Endotracheal tube terminates at the level of the thoracic inlet. Consider slight advancement. 3. Persistent right basilar opacity, likely atelectasis. Electronically Signed   By: Jacob Moores M.D.   On:  10/03/2022 14:42   Portable Chest x-ray  Result Date: 10/03/2022 CLINICAL DATA:  Status post intubation and NG tube placement. EXAM: PORTABLE CHEST 1 VIEW COMPARISON:  CT chest 10/08/2022 FINDINGS: The ET tube tip is 7 cm above the carina. Enteric tube courses below the level of the GE junction. Stable cardiomediastinal contours. Unchanged asymmetric elevation of the right hemidiaphragm with right middle lobe and right lower lobe atelectasis. Underlying right pleural effusion is suboptimally visualized on portable radiograph. The left lung appears clear. IMPRESSION: 1. Stable asymmetric elevation of the right hemidiaphragm with right middle lobe and right lower lobe atelectasis as demonstrated on chest CT from prior day. 2. ETT tip is 7 cm above the carina. NG tube tip courses below the level of the GE junction. Electronically Signed   By: Signa Kell M.D.   On: 10/03/2022 11:57   DG Abd Portable 1V  Result Date: 10/03/2022 CLINICAL DATA:  OG tube placement EXAM: PORTABLE ABDOMEN - 1 VIEW COMPARISON:  None Available. FINDINGS: Enteric tube tip is in the stomach, however, the proximal side holes of the enteric tube are just below the level of the diaphragm. IMPRESSION: Enteric tube tip is in the stomach, however, the proximal side holes of the enteric tube are just below the level of the diaphragm. Recommend advancing 5-10 cm for more optimal radiographic positioning. Electronically Signed   By: Bary Richard M.D.   On: 10/03/2022 11:56   CT HEAD WO CONTRAST ( )  Result Date: 10/03/2022 CLINICAL DATA:  Altered mental status, nontraumatic EXAM: CT HEAD WITHOUT CONTRAST TECHNIQUE: Contiguous axial images were obtained from the base of the skull through the vertex without intravenous contrast. RADIATION DOSE REDUCTION: This exam was performed according to the departmental dose-optimization program which includes automated exposure control, adjustment of the mA and/or kV according to patient size and/or  use of iterative reconstruction technique. COMPARISON:  None Available. FINDINGS: Brain: No evidence of acute infarction, hemorrhage, hydrocephalus, or extra-axial collection. 1.1 cm calcified extra-axial mass in the high left frontal region, most compatible with a small calcified meningioma (series 4, image 30). No significant mass effect of the underlying cortex. No associated vasogenic edema. Mild age related cerebral volume loss. Vascular: No hyperdense vessel or unexpected calcification. Skull: Normal. Negative for fracture or focal lesion. Sinuses/Orbits: No acute finding. Other: None. IMPRESSION: 1. No acute intracranial abnormality. 2. 1.1 cm calcified extra-axial mass in the high left frontal region, most compatible with a small calcified meningioma. This could be confirmed with nonemergent MRI of the brain with and without contrast. Electronically Signed   By: Duanne Guess D.O.   On: 10/03/2022 09:29   CT Abdomen Pelvis W Contrast  Result Date: 09/26/2022 CLINICAL DATA:  Acute nonlocalized abdominal pain. Diagnosis of pancreatic mass with liver metastasis. EXAM: CT ABDOMEN AND PELVIS WITH CONTRAST TECHNIQUE: Multidetector CT imaging of the abdomen and pelvis was performed using the standard protocol following bolus administration of intravenous contrast. RADIATION DOSE REDUCTION: This exam was performed according to the departmental dose-optimization program which includes automated exposure control, adjustment of the mA and/or kV according to patient size and/or use of iterative reconstruction technique. CONTRAST:  OMNIPAQUE IOHEXOL 350 MG/ML SOLN COMPARISON:  CT 09/15/2022 FINDINGS: Lower chest: Assessed on concurrent chest CT, reported separately. Hepatobiliary: Progressive enlarging hepatic metastatic disease. For example, right lobe liver lesion measures 5 x 4 cm, previously 4.7 x 4.2 cm. The liver is enlarged spanning 25.7 cm cranial caudal. No portal vein thrombosis. Unremarkable  gallbladder. Pancreas: Hypodense lesion in the pancreatic tail measures approximately 6.3 x 5.1 cm, previously 6 x 5 cm. Motion artifact limits detailed assessment. There is mild adjacent fat stranding that is new from prior exam. No pancreatic ductal dilatation. Spleen: Enlarged spanning 13.8 cm cranial caudal. No discrete splenic lesion. Adrenals/Urinary Tract: No adrenal nodules. No hydronephrosis. Punctate nonobstructing left renal stone. Small right renal cyst, needing no further imaging follow-up. Minimally distended urinary bladder. No definitive bladder wall thickening. Stomach/Bowel: Nondistended stomach. No bowel obstruction or inflammation. Moderate stool in the distal colon. Normal appendix. Vascular/Lymphatic: Aortic atherosclerosis and tortuosity. No aneurysm. The portal vein remains patent. 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 There is a 14 mm porta hepatis node, series 2, image 40. Additional prominent nodes adjacent to the porta hepatis and SMV. There is a 15 mm peripancreatic node centrally, series 2, image 42. Reproductive: TURP defect in the prostate. Other: Trace perihepatic and perisplenic ascites. Small amount of free fluid in the pelvis dependently. This is new from prior. No mesenteric thickening. Musculoskeletal: Scoliosis and degenerative change in the spine. Bilateral hip osteoarthritis. No focal bone lesion or acute osseous findings. IMPRESSION: 1. Known hypodense mass in the pancreatic tail has slightly increased in size from prior exam, with mild adjacent fat stranding. 2. Progressive hepatic metastatic disease. 3. Upper abdominal adenopathy. 4. Small volume upper abdominal and pelvic ascites, new. 5. Hepatomegaly and splenomegaly again seen. 6. 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 7. Nonobstructing left nephrolithiasis. Aortic Atherosclerosis (ICD10-I70.0). Electronically Signed   By:  Narda Rutherford M.D.   On: 10/04/2022 21:55   CT Angio Chest PE W/Cm &/Or Wo Cm  Result Date: 10/01/2022 CLINICAL DATA:  Pulmonary embolism (PE) suspected, low to intermediate prob, positive D-dimer EXAM: CT ANGIOGRAPHY CHEST WITH  CONTRAST TECHNIQUE: Multidetector CT imaging of the chest was performed using the standard protocol during bolus administration of intravenous contrast. Multiplanar CT image reconstructions and MIPs were obtained to evaluate the vascular anatomy. RADIATION DOSE REDUCTION: This exam was performed according to the departmental dose-optimization program which includes automated exposure control, adjustment of the mA and/or kV according to patient size and/or use of iterative reconstruction technique. CONTRAST:  OMNIPAQUE IOHEXOL 350 MG/ML SOLN COMPARISON:  Chest radiograph earlier today. Chest CTA 09/15/2022 FINDINGS: Cardiovascular: Subsegmental filling defects within the right upper lobe pulmonary arteries, for example series 5, image 107, new from prior exam. Small nonocclusive filling defects within the right middle lobe, left upper lobe and lingular segmental pulmonary arteries, also new. No right heart strain. Minimal aortic atherosclerosis. Heart is normal in size. There are coronary artery calcifications. Minimal pericardial effusion. Mediastinum/Nodes: No enlarged mediastinal or hilar lymph nodes. No esophageal wall thickening. No thyroid nodule. Lungs/Pleura: Small right pleural effusion is increased from prior CT. There is no definite pleural nodularity or enhancement. Increasing atelectasis in the right lower lobe. There is compressive atelectasis in the right middle lobe related to elevated hemidiaphragm. No evidence of pulmonary infarct. No pulmonary nodule. Breathing motion artifact limits detailed assessment. Upper Abdomen: Assessed on concurrent abdominopelvic CT, reported separately. Musculoskeletal: No focal bone lesion or acute osseous findings. Review of the  MIP images confirms the above findings. IMPRESSION: 1. Small bilateral nonocclusive pulmonary emboli, new from prior exam. No right heart strain. 2. Small right pleural effusion has increased from prior CT. Increasing atelectasis in the right lower lobe. Increasing right middle lobe atelectasis related to elevated right hemidiaphragm. 3. Coronary artery calcifications. Aortic Atherosclerosis (ICD10-I70.0). Critical Value/emergent results were called by telephone at the time of interpretation on 10/08/2022 at 9:47 pm to provider [facility] , who verbally acknowledged these results. Electronically Signed   By: Narda Rutherford M.D.   On: 09/27/2022 21:48   DG Chest Port 1 View  Result Date: 09/30/2022 CLINICAL DATA:  Shortness of breath. Patient with history of stage IV pancreatic cancer. EXAM: PORTABLE CHEST 1 VIEW COMPARISON:  Chest radiograph 08/24/2022, CT 09/15/2022 FINDINGS: Increasing right pleural effusion and progressive volume loss at the right lung base. Associated basilar airspace disease. Heart is grossly stable in size allowing for portable technique. No pulmonary edema or pneumothorax. On limited assessment, no acute osseous findings. IMPRESSION: Increasing right pleural effusion and progressive volume loss at the right lung base. Electronically Signed   By: Narda Rutherford M.D.   On: 09/29/2022 16:54   US BIOPSY (LIVER)  Result Date: 09/18/2022 CLINICAL DATA:  Abdominal pain, CT demonstrates pancreatic mass with multiple liver lesions, biopsy requested EXAM: ULTRASOUND-GUIDED CORE LIVER BIOPSY TECHNIQUE: An ultrasound guided liver biopsy was thoroughly discussed with the patient and questions were answered. The benefits, risks, alternatives, and complications were also discussed. The patient understands and wishes to proceed with the procedure. A verbal as well as written consent was obtained. Survey ultrasound of the liver was performed, representative right lobe lesion localized, and an  appropriate skin entry site was determined. Skin site was marked, prepped with chlorhexidine, and draped in usual sterile fashion, and infiltrated locally with 1% lidocaine. Intravenous Fentanyl and Versed  were administered as conscious sedation during continuous monitoring of the patient's level of consciousness and physiological / cardiorespiratory status by the radiology RN, with a total moderate sedation time of 13 minutes. A 17 gauge trocar needle was advanced under ultrasound guidance into the liver to  the margin of the lesion. 3 solid-appearing 2 cm coaxial 18gauge core samples were then obtained through the guide needle. The guide needle was removed. Post procedure scans demonstrate no apparent complication. COMPLICATIONS: COMPLICATIONS None immediate FINDINGS: Multiple liver lesions localized. Representative core biopsy of a right lesion were obtained as above. IMPRESSION: Technically successful ultrasound guided core biopsy of right hepatic lesion. Electronically Signed   By: Corlis Leak M.D.   On: 09/18/2022 11:40   ECHOCARDIOGRAM COMPLETE  Result Date: 09/16/2022    ECHOCARDIOGRAM REPORT   Patient Name:   SELAH ZELMAN Date of Exam: 09/16/2022 Medical Rec #:  [id number]     Height:       77.0 in Accession #:    [id number]    Weight:       332.9 lb Date of Birth:  [birth year]     BSA:          2.778 m Patient Age:    62 years      BP:           112/67 mmHg Patient Gender: M             HR:           92 bpm. [facility] Procedure: 2D Echo, Color Doppler, Cardiac Doppler and Intracardiac            Opacification Agent Indications:    Atrial Fibrillation I48.91  History:        Patient has no prior history of Echocardiogram examinations.                 Arrythmias:Atrial Fibrillation; Risk Factors:Hypertension,                 Dyslipidemia, Former Smoker and Sleep Apnea.  Sonographer:    Aron Baba Referring Phys: [id number] ASIA B ZIERLE-GHOSH  Sonographer Comments: No subcostal  window. Image acquisition challenging due to patient body habitus and Image acquisition challenging due to respiratory motion. IMPRESSIONS  1. Left ventricular ejection fraction, by estimation, is 55 to 60%. The left ventricle has normal function. The left ventricle has no regional wall motion abnormalities. There is mild left ventricular hypertrophy. Left ventricular diastolic parameters are indeterminate.  2. RV-RA gradient 25 mmHg suggesting normal estimated RVSP presuming normal CVP. Right ventricular systolic function is normal. The right ventricular size is normal.  3. The mitral valve is grossly normal. Trivial mitral valve regurgitation.  4. The aortic valve is tricuspid. There is mild calcification of the aortic valve. Aortic valve regurgitation is not visualized. Aortic valve sclerosis is present, with no evidence of aortic valve stenosis.  5. Unable to estimate CVP. Comparison(s): No prior Echocardiogram. FINDINGS  Left Ventricle: Left ventricular ejection fraction, by estimation, is 55 to 60%. The left ventricle has normal function. The left ventricle has no regional wall motion abnormalities. Definity contrast agent was given IV to delineate the left ventricular  endocardial borders. The left ventricular internal cavity size was normal in size. There is mild left ventricular hypertrophy. Abnormal (paradoxical) septal motion, consistent with left bundle branch block. Left ventricular diastolic function could not be evaluated due to atrial fibrillation. Left ventricular diastolic parameters are indeterminate. Right Ventricle: RV-RA gradient 25 mmHg suggesting normal estimated RVSP presuming normal CVP. The right ventricular size is normal. No increase in right ventricular wall thickness. Right ventricular systolic function is normal. Left Atrium: Left atrial size was normal in size. Right Atrium: Right atrial size was normal in size. Pericardium: There  is no evidence of pericardial effusion. Mitral Valve:  The mitral valve is grossly normal. Trivial mitral valve regurgitation. Tricuspid Valve: The tricuspid valve is grossly normal. Tricuspid valve regurgitation is mild. Aortic Valve: The aortic valve is tricuspid. There is mild calcification of the aortic valve. Aortic valve regurgitation is not visualized. Aortic valve sclerosis is present, with no evidence of aortic valve stenosis. Pulmonic Valve: The pulmonic valve was grossly normal. Pulmonic valve regurgitation is trivial. Aorta: The aortic root is normal in size and structure. Venous: Unable to estimate CVP. The inferior vena cava was not well visualized. IAS/Shunts: No atrial level shunt detected by color flow Doppler.  LEFT VENTRICLE PLAX 2D LVIDd:         4.80 cm   Diastology LVIDs:         3.20 cm   LV e' medial:    11.20 cm/s LV PW:         1.20 cm   LV E/e' medial:  9.9 LV IVS:        0.90 cm   LV e' lateral:   12.10 cm/s LVOT diam:     2.40 cm   LV E/e' lateral: 9.2 LV SV:         68 LV SV Index:   25 LVOT Area:     4.52 cm  RIGHT VENTRICLE RV S prime:     15.20 cm/s TAPSE (M-mode): 1.9 cm LEFT ATRIUM             Index        RIGHT ATRIUM           Index LA diam:        5.20 cm 1.87 cm/m   RA Area:     18.00 cm LA Vol (A2C):   52.5 ml 18.90 ml/m  RA Volume:   41.60 ml  14.98 ml/m LA Vol (A4C):   75.8 ml 27.29 ml/m LA Biplane Vol: 69.6 ml 25.06 ml/m  AORTIC VALVE             PULMONIC VALVE LVOT Vmax:   99.00 cm/s  PR End Diast Vel: 1.52 msec LVOT Vmean:  66.800 cm/s LVOT VTI:    0.151 m  AORTA Ao Root diam: 3.80 cm Ao Asc diam:  3.90 cm MITRAL VALVE                TRICUSPID VALVE MV Area (PHT): 4.49 cm     TR Peak grad:   25.0 mmHg MV Decel Time: 169 msec     TR Vmax:        250.00 cm/s MR Peak grad: 20.5 mmHg MR Vmax:      226.45 cm/s   SHUNTS MV E velocity: 111.00 cm/s  Systemic VTI:  0.15 m MV A velocity: 42.20 cm/s   Systemic Diam: 2.40 cm MV E/A ratio:  2.63 Nona Dell MD Electronically signed by Nona Dell MD Signature Date/Time:  09/16/2022/11:13:36 AM    Final    CT ABDOMEN PELVIS W CONTRAST  Result Date: 09/15/2022 CLINICAL DATA:  Abdominal pain, shortness of breath, lethargy and loss of appetite for 3 months * Tracking Code: BO * EXAM: CT ABDOMEN AND PELVIS WITH CONTRAST TECHNIQUE: Multidetector CT imaging of the abdomen and pelvis was performed using the standard protocol following bolus administration of intravenous contrast. RADIATION DOSE REDUCTION: This exam was performed according to the departmental dose-optimization program which includes automated exposure control, adjustment of the mA and/or kV according to patient size and/or  use of iterative reconstruction technique. CONTRAST:  OMNIPAQUE IOHEXOL 350 MG/ML SOLN COMPARISON:  None Available. FINDINGS: Lower chest: Please see separately reported examination of the chest. Hepatobiliary: Numerous hypodense lesions throughout the liver, largest lesion in the anterior liver dome, hepatic segment VIII measuring 4.8 x 4.2 cm (series 4, image 43). Severe hepatomegaly, maximum coronal span 26.5 cm (series 8, image 64). No gallstones, gallbladder wall thickening, or biliary dilatation. Pancreas: Hypodense mass arising from the tip of the pancreatic tail and involving the closely adjacent splenic hilum measuring 6.0 x 5.0 cm (series 4, image 40). No pancreatic ductal dilatation or surrounding inflammatory changes. Spleen: Splenomegaly, maximum coronal span 16.0 cm. Adrenals/Urinary Tract: Adrenal glands are unremarkable. Simple, benign bilateral renal cortical cysts, for which no further follow-up or characterization is required. Kidneys are otherwise normal, without renal calculi, solid lesion, or hydronephrosis. Bladder is unremarkable. Stomach/Bowel: Stomach is within normal limits. Appendix appears normal. No evidence of bowel wall thickening, distention, or inflammatory changes. Vascular/Lymphatic: Aortic atherosclerosis. Splenic and gastric varices, likely secondary to  occlusion of the distal splenic vein by mass. No enlarged abdominal or pelvic lymph nodes. Reproductive: No mass or other significant abnormality. Other: No abdominal wall hernia or abnormality. No ascites. Musculoskeletal: No acute or significant osseous findings. IMPRESSION: 1. Hypodense mass arising from the tip of the pancreatic tail and involving the closely adjacent splenic hilum measuring 6.0 x 5.0 cm consistent with primary pancreatic adenocarcinoma. 2. Numerous hypodense metastatic lesions throughout the liver. 3. Severe hepatomegaly and splenomegaly. 4. 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 Aortic Atherosclerosis (ICD10-I70.0). Electronically Signed   By: Jearld Lesch M.D.   On: 09/15/2022 12:50   CT Angio Chest PE W and/or Wo Contrast  Result Date: 09/15/2022 CLINICAL DATA:  PE suspected, shortness of breath for 3 months * Tracking Code: BO * EXAM: CT ANGIOGRAPHY CHEST WITH CONTRAST TECHNIQUE: Multidetector CT imaging of the chest was performed using the standard protocol during bolus administration of intravenous contrast. Multiplanar CT image reconstructions and MIPs were obtained to evaluate the vascular anatomy. RADIATION DOSE REDUCTION: This exam was performed according to the departmental dose-optimization program which includes automated exposure control, adjustment of the mA and/or kV according to patient size and/or use of iterative reconstruction technique. CONTRAST:  OMNIPAQUE IOHEXOL 350 MG/ML SOLN COMPARISON:  None Available. FINDINGS: Cardiovascular: Satisfactory opacification of the pulmonary arteries to the segmental level. No evidence of pulmonary embolism. Normal heart size. Three-vessel coronary artery calcifications. No pericardial effusion. Aortic atherosclerosis. Mediastinum/Nodes: No enlarged mediastinal, hilar, or axillary lymph nodes. Thyroid gland, trachea, and esophagus demonstrate no significant findings.  Lungs/Pleura: Trace right pleural effusion with associated scarring and or atelectasis of the right lung base. Upper Abdomen: Numerous hypodense lesions throughout the liver as well as a mass of the pancreatic tail (series 07/24/2003). Musculoskeletal: No chest wall abnormality. No acute osseous findings. Review of the MIP images confirms the above findings. IMPRESSION: 1. Negative examination for pulmonary embolism. 2. Trace right pleural effusion with associated scarring and or atelectasis of the right lung base. 3. Numerous hypodense lesions throughout the liver as well as a mass of the pancreatic tail. Findings are highly concerning for primary pancreatic malignancy and hepatic metastatic disease; abdominal findings further discussed on forthcoming dedicated, separately reported examination of the abdomen and pelvis. 4. Coronary artery disease. Aortic Atherosclerosis (ICD10-I70.0). Electronically Signed   By: Jearld Lesch M.D.   On: 09/15/2022 12:44    Microbiology Recent  Results (from the past 240 hour(s))  Culture, blood (routine x 2)     Status: None   Collection Time: 09/28/2022  8:30 PM   Specimen: BLOOD  Result Value Ref Range Status   Specimen Description   Final    BLOOD SITE NOT SPECIFIED Performed at [facility], [address], [place], [place] [address]    Special Requests   Final    BOTTLES DRAWN AEROBIC AND ANAEROBIC Blood Culture adequate volume Performed at [facility], [address], [place], [place] [address]    Culture   Final    NO GROWTH 5 DAYS Performed at [facility], [address], [place], [place] [address]    Report Status 10/07/2022 FINAL  Final  Culture, blood (routine x 2)     Status: None   Collection Time: 10/05/2022  8:35 PM   Specimen: BLOOD  Result Value Ref Range Status   Specimen Description   Final    BLOOD SITE NOT SPECIFIED Performed at [facility], [address], [place],  [place] [address]    Special Requests   Final    BOTTLES DRAWN AEROBIC AND ANAEROBIC Blood Culture adequate volume Performed at [facility], [address], [place], [place] [address]    Culture   Final    NO GROWTH 5 DAYS Performed at [facility], [address], [place], [place] [address]    Report Status 10/07/2022 FINAL  Final  MRSA Next Gen by PCR, Nasal     Status: None   Collection Time: 10/03/22 12:40 PM   Specimen: Nasal Mucosa; Nasal Swab  Result Value Ref Range Status   MRSA by PCR Next Gen NOT DETECTED NOT DETECTED Final    Comment: (NOTE) The GeneXpert MRSA Assay (FDA approved for NASAL specimens only), is one component of a comprehensive MRSA colonization surveillance program. It is not intended to diagnose MRSA infection nor to guide or monitor treatment for MRSA infections. Test performance is not FDA approved in patients less than 79 years old. Performed at [facility], [address], [place], [place] [address]   Culture, Respiratory w Gram Stain     Status: None   Collection Time: 10/03/22  8:08 PM   Specimen: Tracheal Aspirate; Respiratory  Result Value Ref Range Status   Specimen Description   Final    TRACHEAL ASPIRATE Performed at [facility], [address] [place], [place] [address]    Special Requests   Final    NONE Performed at [facility], [address] [place], [place] [address]    Gram Stain   Final    FEW WBC PRESENT,BOTH PMN AND MONONUCLEAR RARE GRAM NEGATIVE RODS    Culture   Final    RARE Normal respiratory flora-no Staph aureus or Pseudomonas seen Performed at [facility], [address], [place], [place] [address]    Report Status 2022/10/26 FINAL  Final  Culture, blood (Routine X 2) w Reflex to ID Panel     Status: None (Preliminary result)   Collection Time: 10/05/22  6:43 AM   Specimen: BLOOD  Result Value Ref Range Status   Specimen  Description   Final    BLOOD BLOOD LEFT ARM AEROBIC BOTTLE ONLY ANAEROBIC BOTTLE ONLY Performed at [facility], [address], [place], [place] [address]    Special Requests   Final    BOTTLES  DRAWN AEROBIC AND ANAEROBIC Blood Culture adequate volume Performed at [facility], [address], [place], [place] [address]    Culture   Final    NO GROWTH 2 DAYS Performed at [facility], [address], [place], [place] [address]    Report Status PENDING  Incomplete  Culture, blood (Routine X 2) w Reflex to ID Panel     Status: None (Preliminary result)   Collection Time: 10/05/22  6:43 AM   Specimen: BLOOD  Result Value Ref Range Status   Specimen Description   Final    BLOOD BLOOD LEFT ARM AEROBIC BOTTLE ONLY Performed at [facility], [address] [place], [place] [address]    Special Requests   Final    BOTTLES DRAWN AEROBIC AND ANAEROBIC Blood Culture adequate volume Performed at [facility], [address], [place], [place] [address]    Culture   Final    NO GROWTH 2 DAYS Performed at [facility], [address] [place], [place] [address]    Report Status PENDING  Incomplete  Culture, blood (Routine X 2) w Reflex to ID Panel     Status: None (Preliminary result)   Collection Time: 09/23/2022  3:06 AM   Specimen: BLOOD  Result Value Ref Range Status   Specimen Description   Final    BLOOD SITE NOT SPECIFIED Performed at [facility], [address], [place], [place] [address]    Special Requests   Final    BOTTLES DRAWN AEROBIC AND ANAEROBIC Blood Culture results may not be optimal due to an inadequate volume of blood received in culture bottles Performed at [facility], [address], [place], [place] [address]    Culture   Final    NO GROWTH 1 DAY Performed at [facility], [address], [place], [place] [address]    Report  Status PENDING  Incomplete  Culture, blood (Routine X 2) w Reflex to ID Panel     Status: None (Preliminary result)   Collection Time: 10/03/2022  7:27 AM   Specimen: BLOOD RIGHT HAND  Result Value Ref Range Status   Specimen Description   Final    BLOOD RIGHT HAND Performed at [facility], [address], [place], [place] [address]    Special Requests   Final    BOTTLES DRAWN AEROBIC ONLY Blood Culture results may not be optimal due to an inadequate volume of blood received in culture bottles Performed at [facility], [address] [place], [place] [address]    Culture   Final    NO GROWTH < 24 HOURS Performed at [facility], [address] [place], [place] [address]    Report Status PENDING  Incomplete    Lab Basic Metabolic Panel: Recent Labs  Lab 11-01-2022 1705 10/03/22 1013 10/04/22 0427 10/04/22 0958 10/04/22 1708 10/05/22 0522 10/05/22 1722 09/27/2022 0530  NA 144 141 138  --   --  132*  --  134*  K 2.4* 3.7 3.6  --   --  3.5  --  3.8  CL 122* 113* 110  --   --  103  --  105  CO2 18* 24 21*  --   --  19*  --  18*  GLUCOSE 87 117* 125*  --   --  168*  --  148*  BUN 28* 36* 44*  --   --  51*  --  77*  CREATININE 0.54* 0.77 1.26*  --   --  1.35*  --  1.88*  CALCIUM 8.7* 12.6* 11.9*  --   --  10.1  --  9.0  MG  --  2.1  --  2.0 2.0 2.1 1.7  --   PHOS  --   --   --  2.5 2.5 2.2* 2.7  --    Liver Function Tests: Recent Labs  Lab 09/22/2022 1705 10/03/22 1013 10/05/22 0522 10-15-22 0530  AST 52* 91* 205* 135*  ALT 35 59* 165* 141*  ALKPHOS 207* 294* 214* 153*  BILITOT 2.1* 4.4* 6.4* 6.7*  PROT 3.7* 5.6* 5.1* 4.6*  ALBUMIN <1.5* 2.0* 1.6* 1.6*   No results for input(s): "LIPASE", "AMYLASE" in the last 168 hours. Recent Labs  Lab 10/03/22 1013  AMMONIA 38*   CBC: Recent Labs  Lab 10/12/2022 1455 10/03/22 0600 10/04/22 0427 10/05/22 0522 2022-10-15 0530  WBC 41.6* 48.7* 55.9* 55.7* 49.6*  NEUTROABS 34.8* 39.1*   --   --   --   HGB 13.1 12.9* 13.5 13.2 11.5*  HCT 42.0 42.6 43.3 42.0 36.2*  MCV 86.6 87.8 88.0 87.3 86.6  PLT 193 180 267 204 128*   Cardiac Enzymes: No results for input(s): "CKTOTAL", "CKMB", "CKMBINDEX", "TROPONINI" in the last 168 hours. Sepsis Labs: Recent Labs  Lab 10/04/2022 2035 10/03/22 0600 10/03/22 1231 10/04/22 0427 10/05/22 0522 Oct 15, 2022 0530  WBC  --  48.7*  --  55.9* 55.7* 49.6*  LATICACIDVEN 1.8  --  2.4*  --   --   --     Procedures/Operations  Endotracheal intubation 10/02/2021 Central line placement 10/02/2021  Hades Mathew A Sahaj Bona 10/07/2022, 2:13 PM

## 2022-10-21 NOTE — Progress Notes (Addendum)
Pt noted asystole rhythm at 2040, RN Gwendolyn Fill at beside, holding pt hand at time of death.  Verified with Gwendolyn Fill RN and Loney Laurence RN.  Sister Darl Pikes called, and they returned to hospital to visit post mortem.  No belongings remain at bedside, and had been brought home earlier.  Patient placement card given to family.  [place] notifed by [employer];  Eyes prepped; post mortem care provided.   All devices removed.  Pt transported to morgue.  Wasted Narcotics by Gwendolyn Fill RN and Geronimo Running RN were Versed - 110 mL and Fentanyl - 235

## 2022-10-21 NOTE — Progress Notes (Signed)
Concern with GI bleed  Heparin levels a little high, will hold, adjust dose Can resume anticoagulation when appropriate  Check CBC at 1500 hrs.  Started on Protonix drip   ABG noted Increase respiratory rate from 20-24

## 2022-10-21 NOTE — Progress Notes (Signed)
ANTICOAGULATION CONSULT NOTE - Follow Up Consult  Pharmacy Consult for Heparin  Indication: atrial fibrillation and pulmonary embolus  No Known Allergies  Patient Measurements: Height:  (195.6 cm) Weight: (!) 156.4 kg (344 lb 12.8 oz) IBW/kg (Calculated) : 89.1 Heparin Dosing Weight: 125 kg (updated 4/15)  Vital Signs: Temp: 98 F (36.7 C) 11/03/22 0530) Temp Source: Axillary Nov 03, 2022 0530) BP: 111/65 11-03-2022 0630)  Labs: Recent Labs    10/03/22 0940 10/03/22 0955 10/04/22 0427 10/04/22 0844 10/05/22 0522 10/05/22 0523 2022-11-03 0530  HGB  --    < > 13.5  --  13.2  --  11.5*  HCT  --   --  43.3  --  42.0  --  36.2*  PLT  --   --  267  --  204  --  128*  HEPARINUNFRC  --    < >  --  0.37  --  0.33 0.52  CREATININE  --    < > 1.26*  --  1.35*  --  1.88*  TROPONINIHS 15  --   --   --   --   --   --    < > = values in this interval not displayed.     Estimated Creatinine Clearance: 66 mL/min (A) (by C-G formula based on SCr of 1.88 mg/dL (H)).   Medications:  Infusions:   sodium chloride 75 mL/hr at 11-03-2022 0612   sodium chloride 10 mL/hr at 11/03/22 0612   albumin human 60 mL/hr at Nov 03, 2022 0612   amiodarone 30 mg/hr (Nov 03, 2022 0612)   azithromycin Stopped (10/05/22 1828)   dexmedetomidine (PRECEDEX) IV infusion 1.1 mcg/kg/hr (Nov 03, 2022 0612)   feeding supplement (VITAL 1.5 CAL) Stopped (10/05/22 2345)   fentaNYL infusion INTRAVENOUS 150 mcg/hr (11/03/2022 0612)   heparin 2,800 Units/hr (11-03-22 0612)   meropenem (MERREM) IV Stopped (11-03-2022 0130)   micafungin (MYCAMINE) 200 mg in sodium chloride 0.9 % 100 mL IVPB Stopped (03-Nov-2022 0234)   norepinephrine (LEVOPHED) Adult infusion 50 mcg/min (11-03-22 2130)   vancomycin     vasopressin 0.03 Units/min (03-Nov-2022 0612)    Assessment: Pt is a 42 yoM with recent diagnosis of pancreatic cancer with mets to the liver. Pt recently diagnosed with atrial fibrillation, not on anticoagulant PTA. Pt was scheduled for port  placement on 4/12 - PAC placement delayed and pt sent to ED for afib with RVR.    4/12 CTA: Small bilateral nonocclusive PE, no right heart strain. Pharmacy consulted to dose heparin for PE and atrial fibrillation. Pt intubated 4/13.   Today, 11-03-22: Heparin level 0.52, remains therapeutic on heparin at 2800 units/hr CBC: Hgb down to 11.5, Plt down to 128k No bleeding or complications noted per RN SCr increasing: 0.54 >> 1.35 > 1.88 AST/ALT elevated but decreased, Tbili up to 6.7 --- Hyperbilirubinemia may cause a falsely low Anti-Xa level.  Confirmatory aPTT added on is supratherapeutic at > 200.    Goal of Therapy:  Heparin level 0.3-0.7 units/ml Monitor platelets by anticoagulation protocol: Yes   Plan:  Hold heparin x1 hour, then reduce to heparin 2400 units/hr  Recheck aPTT 8 hours after rate change Daily aPTT, heparin level, and CBC Continue to use aPTT for dosing titration while Tbili is elevated.    Lynann Beaver PharmD, BCPS [facility] main pharmacy [phone] Nov 03, 2022,7:43 AM

## 2022-10-21 NOTE — Progress Notes (Signed)
Patient with morbid obesity

## 2022-10-21 NOTE — Progress Notes (Signed)
Pharmacy Antibiotic Note  Craig Cole is a 62 y.o. male admitted on 10-09-2022 with sepsis.  Pharmacy has been consulted for Vancomycin dosing.  Immunocompromised patient with fever up to 105F overnight and hypotension.  MD requesting to broaden antibiotic coverage.  Blood cx repeated.  Noted trach asp with GNR.   Plan: Vancomycin 2gm IV x1 now then  IV q12h to target AUC 400-550. Estimated AUC = 504. Increase Meropenem to 1gm IV q8h Increase Micafungin dose to  IV q24h for TBW>115kg Monitor renal function and cx data    Height:  (195.6 cm) (Per family) Weight: (!) 157.5 kg (347 lb 3.6 oz) IBW/kg (Calculated) : 89.1  Temp (24hrs), Avg:102.1 F (38.9 C), Min:98.7 F (37.1 C), Max:105.1 F (40.6 C)  Recent Labs  Lab 10/09/2022 1455 10-09-2022 1705 10/09/22 2035 10/03/22 0600 10/03/22 1013 10/03/22 1231 10/04/22 0427 10/05/22 0522  WBC 41.6*  --   --  48.7*  --   --  55.9* 55.7*  CREATININE  --  0.54*  --   --  0.77  --  1.26* 1.35*  LATICACIDVEN  --   --  1.8  --   --  2.4*  --   --     Estimated Creatinine Clearance: 92.3 mL/min (A) (by C-G formula based on SCr of 1.35 mg/dL (H)).    No Known Allergies  Antimicrobials this admission: 4/13 Ceftriaxone >> 4/14 4/13 Azithromycin >>   4/14 Cefepime >> 4/16 4/14 Vancomycin x 1; 4/16>> 4/16 Meropenem >> 4/16 Micafungin >>   Microbiology results: 4/15 BCx:  4/14 Blood Cx: few GNR on gram stain. ngtd 4/13 Trach Aspirate: rare GNR 4/13 MRSA PCR: negative 10-09-2022 BCx2 ngtd  Thank you for allowing pharmacy to be a part of this patient's care.  Junita Push PharmD 10/19/2022 1:21 AM

## 2022-10-21 NOTE — Progress Notes (Addendum)
NAME:  Craig Cole, MRN:  [id number], DOB:  02-26-1961, LOS: 3 ADMISSION DATE:  11-01-2022, CONSULTATION DATE:  10/03/2022 REFERRING MD:  Dr [place], CHIEF COMPLAINT:  Respiratory failure   History of Present Illness:  Patient with a history of stage IV metastatic pancreatic cancer with liver mets followed by Dr. Ellin Saba who was scheduled for port placement next, noted to be in A-fib with RVR.  Referred to the hospital for further evaluation and management Found to have pulmonary embolism for which she was started on anticoagulation Oxygen requirement worsening, agitation, altered mental status Background history of asthma, GERD, hypertension, hyperlipidemia Diagnosed about 2 weeks ago with A-fib   For his pancreatic cancer, has not started therapy yet but was scheduled for therapy following port placement   Spoke with patient and his sister He was feeling relatively well may be slower than his usual prior to showing up for port placement Did not have specific complaints  Pertinent  Medical History   Past Medical History:  Diagnosis Date   Cancer (HCC)    skin cancer   GERD (gastroesophageal reflux disease)    Glaucoma    High risk sexual behavior    on PrEP for HIV   Hyperlipidemia    Hypertension    Sleep apnea    Significant Hospital Events: Including procedures, antibiotic start and stop dates in addition to other pertinent events   4/13 CT head with no significant intracranial abnormality, 1.1 cm calcified extra-axial mass felt to be related to small calcified meningioma 4/12-progressive hepatic metastatic disease, small ascites, 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, known hypodense mass in the pancreatic tail has increased in size from prior 11-01-2022 CT PE protocol shows nonocclusive bilateral pulmonary emboli, no heart strain, increasing atelectasis right lower lobe, right middle lobe atelectasis, elevated hemidiaphragm 4/13 endotracheal  intubation, central line placement 4/16 increased pressor requirements, high fever   Blood cultures negative 01-Nov-2022-no growth so far 10/05/2022-no growth so far 10/16/2022-no growth so far  Antibiotics Azithromycin 4/13>> Cefepime 4/15>> Meropenem 4/15>>  micafungin 4/15>> Vancomycin 4/13, 4/15>>  Leukocytosis 2022/11/01: 48.7/55.9/55.7/49.6  Interim History / Subjective:  Fever overnight- Tmax 105 Cooling blanket Antibiotics escalated Escalated doses of pressors Coffee-ground's noted in OG tube  Attempts at weaning yesterday-patient remained encephalopathic  Objective   Blood pressure 111/65, pulse 96, temperature 98 F (36.7 C), temperature source Axillary, resp. rate (!) 22, height  (1.956 m), weight (!) 156.4 kg, SpO2 98 %. CVP:  [10 mmHg-12 mmHg] 10 mmHg  Vent Mode: PRVC FiO2 (%):  [40 %] 40 % Set Rate:  [20 bmp] 20 bmp Vt Set:  [650 mL] 650 mL PEEP:  [8 cmH20] 8 cmH20 Pressure Support:  [10 cmH20] 10 cmH20 Plateau Pressure:  [18 cmH20-21 cmH20] 21 cmH20   Intake/Output Summary (Last 24 hours) at 09/26/2022 0748 Last data filed at 09/22/2022 4098 Gross per 24 hour  Intake 8078.26 ml  Output 2275 ml  Net 5803.26 ml   Filed Weights   10/04/22 0700 10/05/22 0500 10/12/2022 0400  Weight: (!) 153.2 kg (!) 157.5 kg (!) 156.4 kg   Examination: General: Middle-aged, does not appear to be in distress HENT: Dry oral mucosa, jaundice Lungs: Bilateral decreased air movement bilaterally worse on the right Cardiovascular: S1-S2 appreciated  abdomen: Soft, bowel sounds appreciated Extremities: No clubbing, no edema, chronic venous stasis changes Neuro: Sedated on vent GU:   Resolved Hospital Problem list     Assessment & Plan:   Hypoxemic respiratory  failure -mechanical ventilation -Target TVol 6-8cc/kgIBW -Target Plateau Pressure < 30cm H20 -Target driving pressure less than 15 cm of water -target PaO2 55-65: titrate PEEP/FiO2 per protocol -Ventilator  associated pneumonia prevention protocol  Fever -Cultures have been negative -Leukocytosis persists -Antibiotics broadened -Micafungin added  Atrial fibrillation with RVR -On amiodarone -On anticoagulation  Metastatic pancreatic cancer -Yet to start therapy -Appears to be progressive with significant increase in pancreatic mass and liver metastases on follow-up CT -Reached out to oncology -Will appreciate their input -He will be appropriate to have goals of care with realistic expectations -I do believe this is a terminal process  Right pleural effusion Atelectasis -Concern for pneumonia -Antibiotics broadened -Notably was not having any respiratory concerns, no cough no fever prior to coming in for his port -Chest x-ray with hypoventilated lung fields, possibly developing infiltrate right lower lobe -Part of the hypoventilation on the right side is due to his massive metastatic liver disease and hepatomegaly  Acute liver injury Transaminitis -High metastatic burden to the liver  Probable stress gastritis -No fresh bleeding noted -Has been on Protonix 40 twice daily -Will start a PPI drip for 72 hours  Acute kidney injury -Multifactorial -Trend renal parameters -Avoid nephrotoxics -Maintain renal perfusion  Leukocytosis -On empiric antibiotics -Antifungals added -May be related to his neoplastic process -Continue antibiotics at present  Acute metabolic encephalopathy -Multifactorial -On lactulose -Ammonia level improving  Significantly fluid positive but with increased pressor needs-difficult to diurese at present Was started on albumin Tube feeds will be continued  History of asthma -Bronchodilators as needed  On [place]covy for high risk sexual behavior on PR EP therapy  Does have a history of obstructive sleep apnea for which he used CPAP at home  Continue to update sister daily  Goals of care discussion Will consult palliative care wound   Best  Practice (right click and "Reselect all SmartList Selections" daily)   Diet/type: tubefeeds DVT prophylaxis: systemic heparin GI prophylaxis: PPI Lines: Central line Foley:  Yes, and it is still needed Code Status:  full code Last date of multidisciplinary goals of care discussion [discussed with sister at bedside 10/03/2022]  Labs   CBC: Recent Labs  Lab 10/04/2022 1455 10/03/22 0600 10/04/22 0427 10/05/22 0522 10-25-22 0530  WBC 41.6* 48.7* 55.9* 55.7* 49.6*  NEUTROABS 34.8* 39.1*  --   --   --   HGB 13.1 12.9* 13.5 13.2 11.5*  HCT 42.0 42.6 43.3 42.0 36.2*  MCV 86.6 87.8 88.0 87.3 86.6  PLT 193 180 267 204 128*    Basic Metabolic Panel: Recent Labs  Lab 09/27/2022 1705 10/03/22 1013 10/04/22 0427 10/04/22 0958 10/04/22 1708 10/05/22 0522 10/05/22 1722 Oct 25, 2022 0530  NA 144 141 138  --   --  132*  --  134*  K 2.4* 3.7 3.6  --   --  3.5  --  3.8  CL 122* 113* 110  --   --  103  --  105  CO2 18* 24 21*  --   --  19*  --  18*  GLUCOSE 87 117* 125*  --   --  168*  --  148*  BUN 28* 36* 44*  --   --  51*  --  77*  CREATININE 0.54* 0.77 1.26*  --   --  1.35*  --  1.88*  CALCIUM 8.7* 12.6* 11.9*  --   --  10.1  --  9.0  MG  --  2.1  --  2.0 2.0 2.1  1.7  --   PHOS  --   --   --  2.5 2.5 2.2* 2.7  --    GFR: Estimated Creatinine Clearance: 66 mL/min (A) (by C-G formula based on SCr of 1.88 mg/dL (H)). Recent Labs  Lab 09/26/2022 2035 10/03/22 0600 10/03/22 1231 10/04/22 0427 10/05/22 0522 10-22-22 0530  WBC  --  48.7*  --  55.9* 55.7* 49.6*  LATICACIDVEN 1.8  --  2.4*  --   --   --     Liver Function Tests: Recent Labs  Lab 10/05/2022 1705 10/03/22 1013 10/05/22 0522 10/22/2022 0530  AST 52* 91* 205* 135*  ALT 35 59* 165* 141*  ALKPHOS 207* 294* 214* 153*  BILITOT 2.1* 4.4* 6.4* 6.7*  PROT 3.7* 5.6* 5.1* 4.6*  ALBUMIN <1.5* 2.0* 1.6* 1.6*   No results for input(s): "LIPASE", "AMYLASE" in the last 168 hours. Recent Labs  Lab 10/03/22 1013  AMMONIA 38*     ABG    Component Value Date/Time   PHART 7.42 10/03/2022 1528   PCO2ART 38 10/03/2022 1528   PO2ART 199 (H) 10/03/2022 1528   HCO3 24.7 10/03/2022 1528   O2SAT 99.6 10/03/2022 1528     Coagulation Profile: Recent Labs  Lab 09/24/2022 1705  INR 1.4*    Cardiac Enzymes: No results for input(s): "CKTOTAL", "CKMB", "CKMBINDEX", "TROPONINI" in the last 168 hours.  HbA1C: No results found for: "HGBA1C"  CBG: Recent Labs  Lab 10/05/22 1601 10/05/22 1941 10/05/22 2303 2022-10-22 0316 2022-10-22 0745  GLUCAP 146* 132* 124* 104* 120*    Review of Systems:   Fever overnight-antibiotics broadened  Past Medical History:  He,  has a past medical history of Cancer (HCC), GERD (gastroesophageal reflux disease), Glaucoma, High risk sexual behavior, Hyperlipidemia, Hypertension, and Sleep apnea.   Surgical History:   Past Surgical History:  Procedure Laterality Date   COLONOSCOPY  05/25/2012   [facility]; rare diverticulum.   COLONOSCOPY WITH PROPOFOL N/A 04/28/2021   Procedure: COLONOSCOPY WITH PROPOFOL;  Surgeon: [name], MD;  Location: [facility];  Service: Endoscopy;  Laterality: N/A;  8:45am   POLYPECTOMY  04/28/2021   Procedure: POLYPECTOMY;  Surgeon: [name], MD;  Location: [facility];  Service: Endoscopy;;   RETINAL DETACHMENT SURGERY Left    TONSILLECTOMY       Social History:   reports that he quit smoking about 25 years ago. His smoking use included cigarettes. He smoked an average of 1 pack per day. He has never used smokeless tobacco. He reports that he does not currently use alcohol. He reports that he does not use drugs.   Family History:  His family history includes Diabetes in his mother; Hypertension in his mother; Myasthenia gravis in his father; Prostate cancer in his brother. There is no history of Colon cancer.   Allergies No Known Allergies   The patient is critically ill with multiple organ  systems failure and requires high complexity decision making for assessment and support, frequent evaluation and titration of therapies, application of advanced monitoring technologies and extensive interpretation of multiple databases. Critical Care Time devoted to patient care services described in this note independent of APP/resident time (if applicable)  is 45 minutes.   [name] MD [facility] Personal pager: See Amion If unanswered, please page CCM On-call: #[phone]

## 2022-10-21 NOTE — Progress Notes (Deleted)
Craig Cole, male    DOB: [birth year]    MRN: [id number]   Brief patient profile:  ***  yo*** *** referred to pulmonary clinic in [place]  10/07/2022 by *** for ***      History of Present Illness  10/07/2022  Pulmonary/ 1st office eval/ [name] / [place] [facility] / on ACEi  No chief complaint on file.    Dyspnea:  *** Cough: *** Sleep: *** SABA use: *** 02: *** Lung cancer screen: ***  Past Medical History:  Diagnosis Date   Cancer (HCC)    skin cancer   GERD (gastroesophageal reflux disease)    Glaucoma    High risk sexual behavior    on PrEP for HIV   Hyperlipidemia    Hypertension    Sleep apnea     Facility-Administered Medications Prior to Visit  Medication Dose Route Frequency Provider Last Rate Last Admin   0.45 % sodium chloride infusion   Intravenous Continuous Olalere, Adewale A, MD 75 mL/hr at 10/27/22 0514 Infusion Verify at 27-Oct-2022 0514   0.9 %  sodium chloride infusion  250 mL Intravenous Continuous Olalere, Adewale A, MD 10 mL/hr at 10/27/2022 0514 Infusion Verify at Oct 27, 2022 0514   acetaminophen (TYLENOL) 160 MG/5ML solution 650 mg  650 mg Per Tube Q6H PRN [name] A, MD   650 mg at Oct 27, 2022 0305   Or   acetaminophen (TYLENOL) suppository 650 mg  650 mg Rectal Q6H PRN Olalere, Adewale A, MD       albuterol (PROVENTIL) (2.5 MG/3ML) 0.083% nebulizer solution 2.5 mg  2.5 mg Nebulization Q2H PRN [name], MD       amiodarone (NEXTERONE PREMIX) 360-4.14 MG/200ML-% (1.8 mg/mL) IV infusion  30 mg/hr Intravenous Continuous Olalere, Adewale A, MD   Stopped at 10-27-22 0510   azithromycin (ZITHROMAX) 500 mg in sodium chloride 0.9 % 250 mL IVPB  500 mg Intravenous Q24H Olalere, Adewale A, MD   Stopped at 10/05/22 1828   Chlorhexidine Gluconate Cloth 2 % PADS 6 each  6 each Topical Daily Olalere, Adewale A, MD   6 each at 10/05/22 1026   dexmedetomidine (PRECEDEX) 400 MCG/100ML (4 mcg/mL) infusion  0-1.2 mcg/kg/hr Intravenous Continuous Olalere,  Adewale A, MD 40.7 mL/hr at 2022-10-27 0514 1.1 mcg/kg/hr at 27-Oct-2022 0514   docusate (COLACE) 50 MG/5ML liquid 100 mg  100 mg Per Tube BID Olalere, Adewale A, MD   100 mg at 10/05/22 2148   docusate sodium (COLACE) capsule 100 mg  100 mg Oral BID PRN Olalere, Adewale A, MD       emtricitabine-tenofovir AF (DESCOVY) 200-25 MG per tablet 1 tablet  1 tablet Per Tube Daily Olalere, Adewale A, MD   1 tablet at 10/05/22 1025   feeding supplement (PROSource TF20) liquid 60 mL  60 mL Per Tube QID Olalere, Adewale A, MD   60 mL at 10/05/22 2148   feeding supplement (VITAL 1.5 CAL) liquid 1,000 mL  1,000 mL Per Tube Continuous Olalere, Adewale A, MD   Stopped at 10/05/22 2345   fentaNYL (SUBLIMAZE) injection 50 mcg  50 mcg Intravenous Q15 min PRN Olalere, Adewale A, MD   50 mcg at 10/03/22 1214   fentaNYL (SUBLIMAZE) injection 50-200 mcg  50-200 mcg Intravenous Q30 min PRN Olalere, Adewale A, MD   50 mcg at 27-Oct-2022 0426   fentaNYL in NS (12mcg/ml) infusion-PREMIX  0-200 mcg/hr Intravenous Continuous [name], MD 15 mL/hr at Oct 27, 2022 0514 150 mcg/hr at 10-27-2022 [phone]  fluticasone (FLONASE) 50 MCG/ACT nasal spray 2 spray  2 spray Each Nare Daily Skip Mayer A, MD   2 spray at 10/04/22 1045   heparin ADULT infusion 100 units/mL (25000 units/254mL)  2,800 Units/hr Intravenous Continuous Cindi Carbon, RPH 28 mL/hr at 10/01/2022 0514 2,800 Units/hr at 09/27/2022 0514   meropenem (MERREM) 1,000 mg in sodium chloride 0.9 % 100 mL IVPB  1,000 mg Intravenous Q8H [name], MD   Stopped at 10/03/2022 0130   micafungin (MYCAMINE) 200 mg in sodium chloride 0.9 % 100 mL IVPB  200 mg Intravenous Q24H [name], MD   Stopped at 09/30/2022 0234   montelukast (SINGULAIR) tablet 10 mg  10 mg Per Tube QHS Olalere, Adewale A, MD   10 mg at 10/05/22 2148   norepinephrine (LEVOPHED) 16 mg in (0.064 mg/mL) premix infusion  0-60 mcg/min Intravenous Titrated [name], MD 50.6 mL/hr at  10/07/2022 0514 54 mcg/min at 10/09/2022 0514   ondansetron (ZOFRAN) tablet 4 mg  4 mg Oral Q6H PRN [name], MD       Or   ondansetron [facility]) injection 4 mg  4 mg Intravenous Q6H PRN [name], MD   4 mg at 10/10/2022 0111   Oral care mouth rinse  15 mL Mouth Rinse Q2H Olalere, Adewale A, MD   15 mL at 10/13/2022 0514   Oral care mouth rinse  15 mL Mouth Rinse PRN Olalere, Adewale A, MD       pantoprazole (PROTONIX) injection 40 mg  40 mg Intravenous Q12H [name], [name], MD       polyethylene glycol (MIRALAX / GLYCOLAX) packet 17 g  17 g Oral Daily PRN Olalere, Adewale A, MD       polyethylene glycol (MIRALAX / GLYCOLAX) packet 17 g  17 g Per Tube Daily Olalere, Adewale A, MD   17 g at 10/05/22 1025   vancomycin (VANCOREADY) IVPB 1750 mg/350 mL  1,750 mg Intravenous Q12H [name], [name], RPH       vasopressin (PITRESSIN) 20 Units in sodium chloride 0.9 % 100 mL infusion-*FOR SHOCK*  0-0.03 Units/min Intravenous Continuous Olalere, Adewale A, MD 9 mL/hr at 10/05/2022 0514 0.03 Units/min at 10/11/2022 0514   Outpatient Medications Prior to Visit  Medication Sig Dispense Refill   Ascorbic Acid (VITA-C PO) Take 1 tablet by mouth daily.     aspirin EC 81 MG tablet Take 81 mg by mouth every evening.     atorvastatin (LIPITOR) 20 MG tablet Take 1 tablet (20 mg total) by mouth daily. 30 tablet 2   Cholecalciferol (D3 PO) Take 1 tablet by mouth daily.     diltiazem (CARDIZEM CD) 300 MG 24 hr capsule Take 1 capsule (300 mg total) by mouth daily. (Patient not taking: Reported on 10/05/2022) 30 capsule 2   diltiazem (DILACOR XR) 240 MG 24 hr capsule Take 240 mg by mouth daily.     dorzolamide-timolol (COSOPT) 2-0.5 % ophthalmic solution Place 1 drop into both eyes 2 (two) times daily.     emtricitabine-tenofovir AF (DESCOVY) 200-25 MG tablet Take 1 tablet by mouth daily. 90 tablet 0   esomeprazole (NEXIUM) 40 MG capsule Take 40 mg by mouth every morning.     fluticasone (FLONASE) 50  MCG/ACT nasal spray Place 2 sprays into both nostrils daily for 14 days. 11 mL 1   furosemide (LASIX) 20 MG tablet Take 20 mg by mouth every other day.     hydrALAZINE (APRESOLINE)  25 MG tablet Take 25 mg by mouth 2 (two) times daily.     ibuprofen (ADVIL) 200 MG tablet Take 200-600 mg by mouth every 6 (six) hours as needed for moderate pain.     lisinopril (ZESTRIL) 40 MG tablet Take 40 mg by mouth daily.     MAGNESIUM PO Take 1 tablet by mouth daily.     Metoprolol Tartrate 75 MG TABS Take 1 tablet (75 mg total) by mouth 2 (two) times daily. 60 tablet 2   montelukast (SINGULAIR) 10 MG tablet Take 10 mg by mouth at bedtime.     Multiple Vitamin (MULTIVITAMIN PO) Take 1 tablet by mouth daily.     Multiple Vitamins-Minerals (ZINC PO) Take 1 tablet by mouth daily.     ondansetron (ZOFRAN-ODT) 4 MG disintegrating tablet Take 1 tablet (4 mg total) by mouth every 8 (eight) hours as needed for nausea or vomiting. 20 tablet 0   prochlorperazine (COMPAZINE) 10 MG tablet Take 1 tablet (10 mg total) by mouth every 6 (six) hours as needed for nausea or vomiting (Nausea or vomiting). 30 tablet 1   promethazine-dextromethorphan (PROMETHAZINE-DM) 6.25-15 MG/5ML syrup Take 5 mLs by mouth at bedtime as needed for cough. Do not take with alcohol or while driving or operating heavy machinery.  May cause drowsiness. (Patient not taking: Reported on 10/05/2022) 118 mL 0   simvastatin (ZOCOR) 40 MG tablet Take 40 mg by mouth daily.     tadalafil (CIALIS) 5 MG tablet Take 5 mg by mouth in the morning.     Tafluprost, PF, 0.0015 % SOLN Place 1 drop into both eyes at bedtime.     tamsulosin (FLOMAX) 0.4 MG CAPS capsule Take 0.4 mg by mouth at bedtime.       Objective:     There were no vitals taken for this visit.         Assessment   No problem-specific Assessment & Plan notes found for this encounter.     Sandrea Hughs, MD 10/10/2022

## 2022-10-21 NOTE — Progress Notes (Signed)
Acute metabolic acidosis was not present

## 2022-10-21 NOTE — Accreditation Note (Signed)
Death in two point soft restraints within 24 hours of removal logged 10/12/2022 at 1343 by Lucilia Yanni RNBSN.

## 2022-10-21 NOTE — Progress Notes (Signed)
eLink Physician-Brief Progress Note Patient Name: Craig Cole DOB: Apr 04, 1961 MRN: [id number]   Date of Service  October 07, 2022  HPI/Events of Note  Patient with fever up to 105 despite Tylenol + cooling blanket. He had a small amount of brownish emesis (50 ml per RN).  eICU Interventions  Blood re cultured and antibiotics broadened to Vanc + Meropenem + Micafungin pending culture results. Protonix Q 12 hours ordered.        Thomasene Lot Craig Cole October 07, 2022, 12:50 AM

## 2022-10-21 NOTE — Progress Notes (Signed)
RT NOTE:  Pt extubated to comfort care measures.

## 2022-10-21 NOTE — Progress Notes (Signed)
Notified Lab that ABG being sent for analysis. 

## 2022-10-21 NOTE — Progress Notes (Signed)
Deep tissue pressure injury to bilateral buttocks, not present on admission

## 2022-10-21 NOTE — Progress Notes (Signed)
Craig Cole   DOB:07-25-1960   XB#:[id number]    ASSESSMENT & PLAN:  Metastatic pancreatic cancer to the liver I reviewed his labs as well as CT imaging with the family The patient has significant disease progression with imaging study less than a month apart He also have hypercalcemia Overall, with significant disease progression, I do not believe he can get better to the point that he can receive treatment After much discussion, his family is in agreement to transition his care to comfort measures  Malignant hypercalcemia His corrected serum calcium is high As above, we will transition his care to comfort measure  Recurrent fever, likely sepsis Progressive liver failure Progressive renal failure He is developing multiorgan failure His family is in agreement to transition his care to comfort measures  CODE STATUS Family member agreed to transition his CODE STATUS to DNR  Discharge planning Anticipate hospital death I have discussed this with primary service/ICU team I will sign off His sister has my number to call  Artis Delay, MD 10/20/2022 3:47 PM  Subjective:  The patient is intubated and sedated.  History is not possible.  I have reviewed his chart extensively.  [name] sister and brother are by the bedside.  His sister-in-law is also by the bedside. The patient was recently diagnosed with metastatic pancreatic cancer.  He underwent liver biopsy on March 29 that confirmed the diagnosis. He was seen by Dr. Ellin Saba recently with plan to start outpatient chemotherapy Port placement was recommended He was taken to the emergency department 4 days ago when he presented for port placement, due to atrial fibrillation with rapid ventricular response He was also noted to be somewhat agitated The patient declined rapidly since admission and is now intubated and sedated.  Sepsis was suspected.  The patient has progressive leukocytosis, respiratory failure, atrial fibrillation  with rapid ventricular response, obstructive jaundice with worsening liver failure and renal failure.  He also is febrile.  He is on maximum life support with multiple medications and broad-spectrum antibiotics  According to his sister, the patient had verbally indicated that he does not want to be resuscitated at some point but it was not documented and no formal paperwork related to healthcare power of attorney was available.  His next of kin are his sister and brother  Objective:  Vitals:   09/23/2022 1500 10/14/2022 1515  BP: 131/73 135/86  Pulse: 90 (!) 109  Resp: (!) 24 (!) 26  Temp:    SpO2: 98% 97%     Intake/Output Summary (Last 24 hours) at 10/19/2022 1547 Last data filed at 09/29/2022 1047 Gross per 24 hour  Intake 9411.5 ml  Output 2275 ml  Net 7136.5 ml    GENERAL: He is sedated and intubated.  On multiple medication.  NG tube is draining bilious drainage.  Appears somewhat jaundiced   Labs:  Recent Labs    10/03/22 1013 10/04/22 0427 10/05/22 0522 10/14/2022 0530  NA 141 138 132* 134*  K 3.7 3.6 3.5 3.8  CL 113* 110 103 105  CO2 24 21* 19* 18*  GLUCOSE 117* 125* 168* 148*  BUN 36* 44* 51* 77*  CREATININE 0.77 1.26* 1.35* 1.88*  CALCIUM 12.6* 11.9* 10.1 9.0  GFRNONAA >60 >60 59* 40*  PROT 5.6*  --  5.1* 4.6*  ALBUMIN 2.0*  --  1.6* 1.6*  AST 91*  --  205* 135*  ALT 59*  --  165* 141*  ALKPHOS 294*  --  214* 153*  BILITOT 4.4*  --  6.4* 6.7*    Studies: I have reviewed his imaging study with the  family DG Abd 1 View  Result Date: 10/16/2022 CLINICAL DATA:  62 year old male with history of ileus. EXAM: ABDOMEN - 1 VIEW COMPARISON:  Abdominal radiograph 10/03/2022. FINDINGS: Tip of nasogastric tube is in the antral pre-pyloric region of the stomach. Visualized bowel gas pattern is nonobstructive. No definite pneumoperitoneum noted on this single image. IMPRESSION: 1. Support apparatus, as above. 2. Unremarkable bowel gas pattern. Electronically Signed   By:  Trudie Reed M.D.   On: Oct 16, 2022 05:55   DG Chest Port 1 View  Result Date: 10/16/2022 CLINICAL DATA:  62 year old male with history of acute respiratory failure. EXAM: PORTABLE CHEST 1 VIEW COMPARISON:  Chest x-ray 10/05/2022. FINDINGS: An endotracheal tube is in place with tip 5.1 cm above the carina. There is a right-sided internal jugular central venous catheter with tip terminating in the superior cavoatrial junction. A nasogastric tube is seen extending into the stomach, however, the tip of the nasogastric tube extends below the lower margin of the image. Lung volumes are low. Poorly defined opacities throughout the right mid to lower lung, particularly medially. Left lung appears clear. No pleural effusions. No pneumothorax. No evidence of pulmonary edema. Heart size appears borderline enlarged. Upper mediastinal contours are distorted by patient's rotation to the right. IMPRESSION: 1. Support apparatus, as above. 2. Low lung volumes with ill-defined opacity throughout the right mid to lower lung concerning for developing atelectasis and/or consolidation. Electronically Signed   By: Trudie Reed M.D.   On: Oct 16, 2022 05:54   DG CHEST PORT 1 VIEW  Result Date: 10/05/2022 CLINICAL DATA:  Respiratory failure EXAM: PORTABLE CHEST 1 VIEW COMPARISON:  CXR 10/03/22 FINDINGS: Assessment is slightly limited due to poor penetration. Endotracheal tube is positioned in the midtrachea. Right-sided central venous catheter with the tip near the cavoatrial junction. Enteric tube courses below the diaphragm with the side hole and tip poorly visualized. Small right pleural effusion asymmetric elevation right hemidiaphragm. Unchanged cardiac and mediastinal contours. Hazy opacity at the right lung base could represent atelectasis or infection. No radiographically apparent displaced rib fractures. Visualized upper abdomen is unremarkable., IMPRESSION: 1. Small right pleural effusion, slightly increased from  prior exam. 2. Hazy opacity at the right lung base could represent atelectasis or infection. 3. Endotracheal tube is below the thoracic inlet and above the carina. Enteric tube is poorly visualized due to technique. Electronically Signed   By: Lorenza Cambridge M.D.   On: 10/05/2022 13:04   DG CHEST PORT 1 VIEW  Result Date: 10/03/2022 CLINICAL DATA:  Central line placement EXAM: PORTABLE CHEST 1 VIEW COMPARISON:  Chest x-ray October 03, 2022 FINDINGS: Interval placement of a right approach central venous catheter, terminating in the right atrium. The endotracheal tube terminates at the level of the thoracic inlet. NG tube in place, terminating below the field of view. The cardiomediastinal silhouette is unchanged in contour. Persistent elevation of the right hemidiaphragm. Unchanged right basilar opacity, likely atelectasis. No new focal pulmonary opacity. No pleural effusion or pneumothorax. The visualized upper abdomen is unremarkable. No acute osseous abnormality. IMPRESSION: 1. Interval placement of a right central venous catheter, terminating in the right atrium. 2. Endotracheal tube terminates at the level of the thoracic inlet. Consider slight advancement. 3. Persistent right basilar opacity, likely atelectasis. Electronically Signed   By: Jacob Moores M.D.   On: 10/03/2022 14:42   Portable Chest x-ray  Result Date: 10/03/2022 CLINICAL DATA:  Status post intubation  and NG tube placement. EXAM: PORTABLE CHEST 1 VIEW COMPARISON:  CT chest 10/05/2022 FINDINGS: The ET tube tip is 7 cm above the carina. Enteric tube courses below the level of the GE junction. Stable cardiomediastinal contours. Unchanged asymmetric elevation of the right hemidiaphragm with right middle lobe and right lower lobe atelectasis. Underlying right pleural effusion is suboptimally visualized on portable radiograph. The left lung appears clear. IMPRESSION: 1. Stable asymmetric elevation of the right hemidiaphragm with right middle  lobe and right lower lobe atelectasis as demonstrated on chest CT from prior day. 2. ETT tip is 7 cm above the carina. NG tube tip courses below the level of the GE junction. Electronically Signed   By: Signa Kell M.D.   On: 10/03/2022 11:57   DG Abd Portable 1V  Result Date: 10/03/2022 CLINICAL DATA:  OG tube placement EXAM: PORTABLE ABDOMEN - 1 VIEW COMPARISON:  None Available. FINDINGS: Enteric tube tip is in the stomach, however, the proximal side holes of the enteric tube are just below the level of the diaphragm. IMPRESSION: Enteric tube tip is in the stomach, however, the proximal side holes of the enteric tube are just below the level of the diaphragm. Recommend advancing 5-10 cm for more optimal radiographic positioning. Electronically Signed   By: Bary Richard M.D.   On: 10/03/2022 11:56   CT HEAD WO CONTRAST ( )  Result Date: 10/03/2022 CLINICAL DATA:  Altered mental status, nontraumatic EXAM: CT HEAD WITHOUT CONTRAST TECHNIQUE: Contiguous axial images were obtained from the base of the skull through the vertex without intravenous contrast. RADIATION DOSE REDUCTION: This exam was performed according to the departmental dose-optimization program which includes automated exposure control, adjustment of the mA and/or kV according to patient size and/or use of iterative reconstruction technique. COMPARISON:  None Available. FINDINGS: Brain: No evidence of acute infarction, hemorrhage, hydrocephalus, or extra-axial collection. 1.1 cm calcified extra-axial mass in the high left frontal region, most compatible with a small calcified meningioma (series 4, image 30). No significant mass effect of the underlying cortex. No associated vasogenic edema. Mild age related cerebral volume loss. Vascular: No hyperdense vessel or unexpected calcification. Skull: Normal. Negative for fracture or focal lesion. Sinuses/Orbits: No acute finding. Other: None. IMPRESSION: 1. No acute intracranial abnormality. 2.  1.1 cm calcified extra-axial mass in the high left frontal region, most compatible with a small calcified meningioma. This could be confirmed with nonemergent MRI of the brain with and without contrast. Electronically Signed   By: Duanne Guess D.O.   On: 10/03/2022 09:29   CT Abdomen Pelvis W Contrast  Result Date: 09/23/2022 CLINICAL DATA:  Acute nonlocalized abdominal pain. Diagnosis of pancreatic mass with liver metastasis. EXAM: CT ABDOMEN AND PELVIS WITH CONTRAST TECHNIQUE: Multidetector CT imaging of the abdomen and pelvis was performed using the standard protocol following bolus administration of intravenous contrast. RADIATION DOSE REDUCTION: This exam was performed according to the departmental dose-optimization program which includes automated exposure control, adjustment of the mA and/or kV according to patient size and/or use of iterative reconstruction technique. CONTRAST:  OMNIPAQUE IOHEXOL 350 MG/ML SOLN COMPARISON:  CT 09/15/2022 FINDINGS: Lower chest: Assessed on concurrent chest CT, reported separately. Hepatobiliary: Progressive enlarging hepatic metastatic disease. For example, right lobe liver lesion measures 5 x 4 cm, previously 4.7 x 4.2 cm. The liver is enlarged spanning 25.7 cm cranial caudal. No portal vein thrombosis. Unremarkable gallbladder. Pancreas: Hypodense lesion in the pancreatic tail measures approximately 6.3 x 5.1 cm, previously 6 x 5 cm. Motion  artifact limits detailed assessment. There is mild adjacent fat stranding that is new from prior exam. No pancreatic ductal dilatation. Spleen: Enlarged spanning 13.8 cm cranial caudal. No discrete splenic lesion. Adrenals/Urinary Tract: No adrenal nodules. No hydronephrosis. Punctate nonobstructing left renal stone. Small right renal cyst, needing no further imaging follow-up. Minimally distended urinary bladder. No definitive bladder wall thickening. Stomach/Bowel: Nondistended stomach. No bowel obstruction or  inflammation. Moderate stool in the distal colon. Normal appendix. Vascular/Lymphatic: Aortic atherosclerosis and tortuosity. No aneurysm. The portal vein remains patent. 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 There is a 14 mm porta hepatis node, series 2, image 40. Additional prominent nodes adjacent to the porta hepatis and SMV. There is a 15 mm peripancreatic node centrally, series 2, image 42. Reproductive: TURP defect in the prostate. Other: Trace perihepatic and perisplenic ascites. Small amount of free fluid in the pelvis dependently. This is new from prior. No mesenteric thickening. Musculoskeletal: Scoliosis and degenerative change in the spine. Bilateral hip osteoarthritis. No focal bone lesion or acute osseous findings. IMPRESSION: 1. Known hypodense mass in the pancreatic tail has slightly increased in size from prior exam, with mild adjacent fat stranding. 2. Progressive hepatic metastatic disease. 3. Upper abdominal adenopathy. 4. Small volume upper abdominal and pelvic ascites, new. 5. Hepatomegaly and splenomegaly again seen. 6. 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 7. Nonobstructing left nephrolithiasis. Aortic Atherosclerosis (ICD10-I70.0). Electronically Signed   By: Narda Rutherford M.D.   On: 10-12-22 21:55   CT Angio Chest PE W/Cm &/Or Wo Cm  Result Date: 2022/10/12 CLINICAL DATA:  Pulmonary embolism (PE) suspected, low to intermediate prob, positive D-dimer EXAM: CT ANGIOGRAPHY CHEST WITH CONTRAST TECHNIQUE: Multidetector CT imaging of the chest was performed using the standard protocol during bolus administration of intravenous contrast. Multiplanar CT image reconstructions and MIPs were obtained to evaluate the vascular anatomy. RADIATION DOSE REDUCTION: This exam was performed according to the departmental dose-optimization program which includes automated exposure control, adjustment of  the mA and/or kV according to patient size and/or use of iterative reconstruction technique. CONTRAST:  OMNIPAQUE IOHEXOL 350 MG/ML SOLN COMPARISON:  Chest radiograph earlier today. Chest CTA 09/15/2022 FINDINGS: Cardiovascular: Subsegmental filling defects within the right upper lobe pulmonary arteries, for example series 5, image 107, new from prior exam. Small nonocclusive filling defects within the right middle lobe, left upper lobe and lingular segmental pulmonary arteries, also new. No right heart strain. Minimal aortic atherosclerosis. Heart is normal in size. There are coronary artery calcifications. Minimal pericardial effusion. Mediastinum/Nodes: No enlarged mediastinal or hilar lymph nodes. No esophageal wall thickening. No thyroid nodule. Lungs/Pleura: Small right pleural effusion is increased from prior CT. There is no definite pleural nodularity or enhancement. Increasing atelectasis in the right lower lobe. There is compressive atelectasis in the right middle lobe related to elevated hemidiaphragm. No evidence of pulmonary infarct. No pulmonary nodule. Breathing motion artifact limits detailed assessment. Upper Abdomen: Assessed on concurrent abdominopelvic CT, reported separately. Musculoskeletal: No focal bone lesion or acute osseous findings. Review of the MIP images confirms the above findings. IMPRESSION: 1. Small bilateral nonocclusive pulmonary emboli, new from prior exam. No right heart strain. 2. Small right pleural effusion has increased from prior CT. Increasing atelectasis in the right lower lobe. Increasing right middle lobe atelectasis related to elevated right hemidiaphragm. 3. Coronary artery calcifications. Aortic Atherosclerosis (ICD10-I70.0). Critical Value/emergent results were called by telephone at the time of interpretation  on 09/26/2022 at 9:47 pm to provider [facility] , who verbally acknowledged these results. Electronically Signed   By: Narda Rutherford M.D.   On:  09/27/2022 21:48   DG Chest Port 1 View  Result Date: 10/18/2022 CLINICAL DATA:  Shortness of breath. Patient with history of stage IV pancreatic cancer. EXAM: PORTABLE CHEST 1 VIEW COMPARISON:  Chest radiograph 08/24/2022, CT 09/15/2022 FINDINGS: Increasing right pleural effusion and progressive volume loss at the right lung base. Associated basilar airspace disease. Heart is grossly stable in size allowing for portable technique. No pulmonary edema or pneumothorax. On limited assessment, no acute osseous findings. IMPRESSION: Increasing right pleural effusion and progressive volume loss at the right lung base. Electronically Signed   By: Narda Rutherford M.D.   On: 10/05/2022 16:54   US BIOPSY (LIVER)  Result Date: 09/18/2022 CLINICAL DATA:  Abdominal pain, CT demonstrates pancreatic mass with multiple liver lesions, biopsy requested EXAM: ULTRASOUND-GUIDED CORE LIVER BIOPSY TECHNIQUE: An ultrasound guided liver biopsy was thoroughly discussed with the patient and questions were answered. The benefits, risks, alternatives, and complications were also discussed. The patient understands and wishes to proceed with the procedure. A verbal as well as written consent was obtained. Survey ultrasound of the liver was performed, representative right lobe lesion localized, and an appropriate skin entry site was determined. Skin site was marked, prepped with chlorhexidine, and draped in usual sterile fashion, and infiltrated locally with 1% lidocaine. Intravenous Fentanyl and Versed 1mg  were administered as conscious sedation during continuous monitoring of the patient's level of consciousness and physiological / cardiorespiratory status by the radiology RN, with a total moderate sedation time of 13 minutes. A 17 gauge trocar needle was advanced under ultrasound guidance into the liver to the margin of the lesion. 3 solid-appearing 2 cm coaxial 18gauge core samples were then obtained through the guide needle.  The guide needle was removed. Post procedure scans demonstrate no apparent complication. COMPLICATIONS: COMPLICATIONS None immediate FINDINGS: Multiple liver lesions localized. Representative core biopsy of a right lesion were obtained as above. IMPRESSION: Technically successful ultrasound guided core biopsy of right hepatic lesion. Electronically Signed   By: Corlis Leak M.D.   On: 09/18/2022 11:40   ECHOCARDIOGRAM COMPLETE  Result Date: 09/16/2022    ECHOCARDIOGRAM REPORT   Patient Name:   SAVIO ALBRECHT Date of Exam: 09/16/2022 Medical Rec #:  [id number]     Height:       77.0 in Accession #:    [id number]    Weight:       332.9 lb Date of Birth:  26-Jul-1960     BSA:          2.778 m Patient Age:    62 years      BP:           112/67 mmHg Patient Gender: M             HR:           92 bpm. [facility] Procedure: 2D Echo, Color Doppler, Cardiac Doppler and Intracardiac            Opacification Agent Indications:    Atrial Fibrillation I48.91  History:        Patient has no prior history of Echocardiogram examinations.                 Arrythmias:Atrial Fibrillation; Risk Factors:Hypertension,                 Dyslipidemia,  Former Smoker and Sleep Apnea.  Sonographer:    Aron Baba Referring Phys: [id number] ASIA B ZIERLE-GHOSH  Sonographer Comments: No subcostal window. Image acquisition challenging due to patient body habitus and Image acquisition challenging due to respiratory motion. IMPRESSIONS  1. Left ventricular ejection fraction, by estimation, is 55 to 60%. The left ventricle has normal function. The left ventricle has no regional wall motion abnormalities. There is mild left ventricular hypertrophy. Left ventricular diastolic parameters are indeterminate.  2. RV-RA gradient 25 mmHg suggesting normal estimated RVSP presuming normal CVP. Right ventricular systolic function is normal. The right ventricular size is normal.  3. The mitral valve is grossly normal. Trivial mitral valve  regurgitation.  4. The aortic valve is tricuspid. There is mild calcification of the aortic valve. Aortic valve regurgitation is not visualized. Aortic valve sclerosis is present, with no evidence of aortic valve stenosis.  5. Unable to estimate CVP. Comparison(s): No prior Echocardiogram. FINDINGS  Left Ventricle: Left ventricular ejection fraction, by estimation, is 55 to 60%. The left ventricle has normal function. The left ventricle has no regional wall motion abnormalities. Definity contrast agent was given IV to delineate the left ventricular  endocardial borders. The left ventricular internal cavity size was normal in size. There is mild left ventricular hypertrophy. Abnormal (paradoxical) septal motion, consistent with left bundle branch block. Left ventricular diastolic function could not be evaluated due to atrial fibrillation. Left ventricular diastolic parameters are indeterminate. Right Ventricle: RV-RA gradient 25 mmHg suggesting normal estimated RVSP presuming normal CVP. The right ventricular size is normal. No increase in right ventricular wall thickness. Right ventricular systolic function is normal. Left Atrium: Left atrial size was normal in size. Right Atrium: Right atrial size was normal in size. Pericardium: There is no evidence of pericardial effusion. Mitral Valve: The mitral valve is grossly normal. Trivial mitral valve regurgitation. Tricuspid Valve: The tricuspid valve is grossly normal. Tricuspid valve regurgitation is mild. Aortic Valve: The aortic valve is tricuspid. There is mild calcification of the aortic valve. Aortic valve regurgitation is not visualized. Aortic valve sclerosis is present, with no evidence of aortic valve stenosis. Pulmonic Valve: The pulmonic valve was grossly normal. Pulmonic valve regurgitation is trivial. Aorta: The aortic root is normal in size and structure. Venous: Unable to estimate CVP. The inferior vena cava was not well visualized. IAS/Shunts: No atrial  level shunt detected by color flow Doppler.  LEFT VENTRICLE PLAX 2D LVIDd:         4.80 cm   Diastology LVIDs:         3.20 cm   LV e' medial:    11.20 cm/s LV PW:         1.20 cm   LV E/e' medial:  9.9 LV IVS:        0.90 cm   LV e' lateral:   12.10 cm/s LVOT diam:     2.40 cm   LV E/e' lateral: 9.2 LV SV:         68 LV SV Index:   25 LVOT Area:     4.52 cm  RIGHT VENTRICLE RV S prime:     15.20 cm/s TAPSE (M-mode): 1.9 cm LEFT ATRIUM             Index        RIGHT ATRIUM           Index LA diam:        5.20 cm 1.87 cm/m   RA Area:  18.00 cm LA Vol (A2C):   52.5 ml 18.90 ml/m  RA Volume:   41.60 ml  14.98 ml/m LA Vol (A4C):   75.8 ml 27.29 ml/m LA Biplane Vol: 69.6 ml 25.06 ml/m  AORTIC VALVE             PULMONIC VALVE LVOT Vmax:   99.00 cm/s  PR End Diast Vel: 1.52 msec LVOT Vmean:  66.800 cm/s LVOT VTI:    0.151 m  AORTA Ao Root diam: 3.80 cm Ao Asc diam:  3.90 cm MITRAL VALVE                TRICUSPID VALVE MV Area (PHT): 4.49 cm     TR Peak grad:   25.0 mmHg MV Decel Time: 169 msec     TR Vmax:        250.00 cm/s MR Peak grad: 20.5 mmHg MR Vmax:      226.45 cm/s   SHUNTS MV E velocity: 111.00 cm/s  Systemic VTI:  0.15 m MV A velocity: 42.20 cm/s   Systemic Diam: 2.40 cm MV E/A ratio:  2.63 Nona Dell MD Electronically signed by Nona Dell MD Signature Date/Time: 09/16/2022/11:13:36 AM    Final    CT ABDOMEN PELVIS W CONTRAST  Result Date: 09/15/2022 CLINICAL DATA:  Abdominal pain, shortness of breath, lethargy and loss of appetite for 3 months * Tracking Code: BO * EXAM: CT ABDOMEN AND PELVIS WITH CONTRAST TECHNIQUE: Multidetector CT imaging of the abdomen and pelvis was performed using the standard protocol following bolus administration of intravenous contrast. RADIATION DOSE REDUCTION: This exam was performed according to the departmental dose-optimization program which includes automated exposure control, adjustment of the mA and/or kV according to patient size and/or use of iterative  reconstruction technique. CONTRAST:  OMNIPAQUE IOHEXOL 350 MG/ML SOLN COMPARISON:  None Available. FINDINGS: Lower chest: Please see separately reported examination of the chest. Hepatobiliary: Numerous hypodense lesions throughout the liver, largest lesion in the anterior liver dome, hepatic segment VIII measuring 4.8 x 4.2 cm (series 4, image 43). Severe hepatomegaly, maximum coronal span 26.5 cm (series 8, image 64). No gallstones, gallbladder wall thickening, or biliary dilatation. Pancreas: Hypodense mass arising from the tip of the pancreatic tail and involving the closely adjacent splenic hilum measuring 6.0 x 5.0 cm (series 4, image 40). No pancreatic ductal dilatation or surrounding inflammatory changes. Spleen: Splenomegaly, maximum coronal span 16.0 cm. Adrenals/Urinary Tract: Adrenal glands are unremarkable. Simple, benign bilateral renal cortical cysts, for which no further follow-up or characterization is required. Kidneys are otherwise normal, without renal calculi, solid lesion, or hydronephrosis. Bladder is unremarkable. Stomach/Bowel: Stomach is within normal limits. Appendix appears normal. No evidence of bowel wall thickening, distention, or inflammatory changes. Vascular/Lymphatic: Aortic atherosclerosis. 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 No enlarged abdominal or pelvic lymph nodes. Reproductive: No mass or other significant abnormality. Other: No abdominal wall hernia or abnormality. No ascites. Musculoskeletal: No acute or significant osseous findings. IMPRESSION: 1. Hypodense mass arising from the tip of the pancreatic tail and involving the closely adjacent splenic hilum measuring 6.0 x 5.0 cm consistent with primary pancreatic adenocarcinoma. 2. Numerous hypodense metastatic lesions throughout the liver. 3. Severe hepatomegaly and splenomegaly. 4. Splenic and gastric varices, likely secondary to occlusion of the distal  splenic vein by mass. Aortic Atherosclerosis (ICD10-I70.0). Electronically Signed   By: Jearld Lesch M.D.   On: 09/15/2022 12:50   CT Angio Chest PE W and/or  Wo Contrast  Result Date: 09/15/2022 CLINICAL DATA:  PE suspected, shortness of breath for 3 months * Tracking Code: BO * EXAM: CT ANGIOGRAPHY CHEST WITH CONTRAST TECHNIQUE: Multidetector CT imaging of the chest was performed using the standard protocol during bolus administration of intravenous contrast. Multiplanar CT image reconstructions and MIPs were obtained to evaluate the vascular anatomy. RADIATION DOSE REDUCTION: This exam was performed according to the departmental dose-optimization program which includes automated exposure control, adjustment of the mA and/or kV according to patient size and/or use of iterative reconstruction technique. CONTRAST:  OMNIPAQUE IOHEXOL 350 MG/ML SOLN COMPARISON:  None Available. FINDINGS: Cardiovascular: Satisfactory opacification of the pulmonary arteries to the segmental level. No evidence of pulmonary embolism. Normal heart size. Three-vessel coronary artery calcifications. No pericardial effusion. Aortic atherosclerosis. Mediastinum/Nodes: No enlarged mediastinal, hilar, or axillary lymph nodes. Thyroid gland, trachea, and esophagus demonstrate no significant findings. Lungs/Pleura: Trace right pleural effusion with associated scarring and or atelectasis of the right lung base. Upper Abdomen: Numerous hypodense lesions throughout the liver as well as a mass of the pancreatic tail (series 07/24/2003). Musculoskeletal: No chest wall abnormality. No acute osseous findings. Review of the MIP images confirms the above findings. IMPRESSION: 1. Negative examination for pulmonary embolism. 2. Trace right pleural effusion with associated scarring and or atelectasis of the right lung base. 3. Numerous hypodense lesions throughout the liver as well as a mass of the pancreatic tail. Findings are highly concerning for  primary pancreatic malignancy and hepatic metastatic disease; abdominal findings further discussed on forthcoming dedicated, separately reported examination of the abdomen and pelvis. 4. Coronary artery disease. Aortic Atherosclerosis (ICD10-I70.0). Electronically Signed   By: Jearld Lesch M.D.   On: 09/15/2022 12:44

## 2022-10-21 NOTE — Progress Notes (Signed)
Pharmacy Antibiotic Note  Craig Cole is a 62 y.o. male admitted on 10/14/2022 with sepsis.  Pharmacy has been consulted for Vancomycin dosing.  Immunocompromised patient with fever up to 105F overnight and hypotension.  MD requesting to broaden antibiotic coverage.  Blood cx repeated.  Noted trach asp with GNR.   Plan: Continue Meropenem to 1gm IV q8h Continue Micafungin  IV q24h for TBW>115kg Vancomycin 1750 mg IV q24h  (SCr up to 1.88, Vd 0.5, est AUC 481) Measure Vanc levels as needed.  Goal AUC = 400 - 550  Follow up renal function, culture results, and clinical course.    Height:  (195.6 cm) Weight: (!) 156.4 kg (344 lb 12.8 oz) IBW/kg (Calculated) : 89.1  Temp (24hrs), Avg:101.5 F (38.6 C), Min:98 F (36.7 C), Max:105.1 F (40.6 C)  Recent Labs  Lab 10/16/2022 1455 10/13/2022 1705 10/01/2022 2035 10/03/22 0600 10/03/22 1013 10/03/22 1231 10/04/22 0427 10/05/22 0522 10-23-2022 0530  WBC 41.6*  --   --  48.7*  --   --  55.9* 55.7* 49.6*  CREATININE  --  0.54*  --   --  0.77  --  1.26* 1.35* 1.88*  LATICACIDVEN  --   --  1.8  --   --  2.4*  --   --   --      Estimated Creatinine Clearance: 66 mL/min (A) (by C-G formula based on SCr of 1.88 mg/dL (H)).    No Known Allergies  Antimicrobials this admission: 4/13 Ceftriaxone >> 4/14 4/13 Azithromycin >>   4/14 Cefepime >> Oct 23, 2022 4/14 Vancomycin x 1; 4/16>> Oct 23, 2022 Meropenem >> 10/23/2022 Micafungin >>   Microbiology results: 4/15 BCx:  4/14 Blood Cx: few GNR on gram stain. ngtd 4/13 Trach Aspirate: rare GNR 4/13 MRSA PCR: negative 4/12 BCx2 ngtd  Thank you for allowing pharmacy to be a part of this patient's care.  Lynann Beaver PharmD, BCPS [facility] main pharmacy [phone] 10/23/2022 8:36 AM

## 2022-10-21 DEATH — deceased

## 2022-11-02 ENCOUNTER — Other Ambulatory Visit: Payer: BC Managed Care – PPO

## 2022-11-03 ENCOUNTER — Ambulatory Visit: Payer: BC Managed Care – PPO

## 2022-11-03 ENCOUNTER — Ambulatory Visit: Payer: BC Managed Care – PPO | Admitting: Hematology

## 2022-11-05 NOTE — Progress Notes (Signed)
Patient deceased- pancreatic cancer and atrial fib. We had planned for in lab study for oxygen- no longer needed.

## 2022-11-17 ENCOUNTER — Ambulatory Visit: Payer: BC Managed Care – PPO | Admitting: Hematology

## 2022-11-17 ENCOUNTER — Other Ambulatory Visit: Payer: BC Managed Care – PPO

## 2022-11-18 ENCOUNTER — Ambulatory Visit: Payer: BC Managed Care – PPO

## 2022-12-03 ENCOUNTER — Telehealth: Payer: BC Managed Care – PPO | Admitting: Licensed Clinical Social Worker

## 2022-12-28 ENCOUNTER — Ambulatory Visit: Payer: BC Managed Care – PPO | Admitting: Pharmacist

## 2023-01-13 IMAGING — DX DG HIP (WITH OR WITHOUT PELVIS) 2-3V*L*
3 series · 3 of 3 positions shown · non-contrast
Comparison: None.

CLINICAL DATA: Left hip pain

EXAM:
DG HIP (WITH OR WITHOUT PELVIS) 2-3V LEFT

[hip lat]
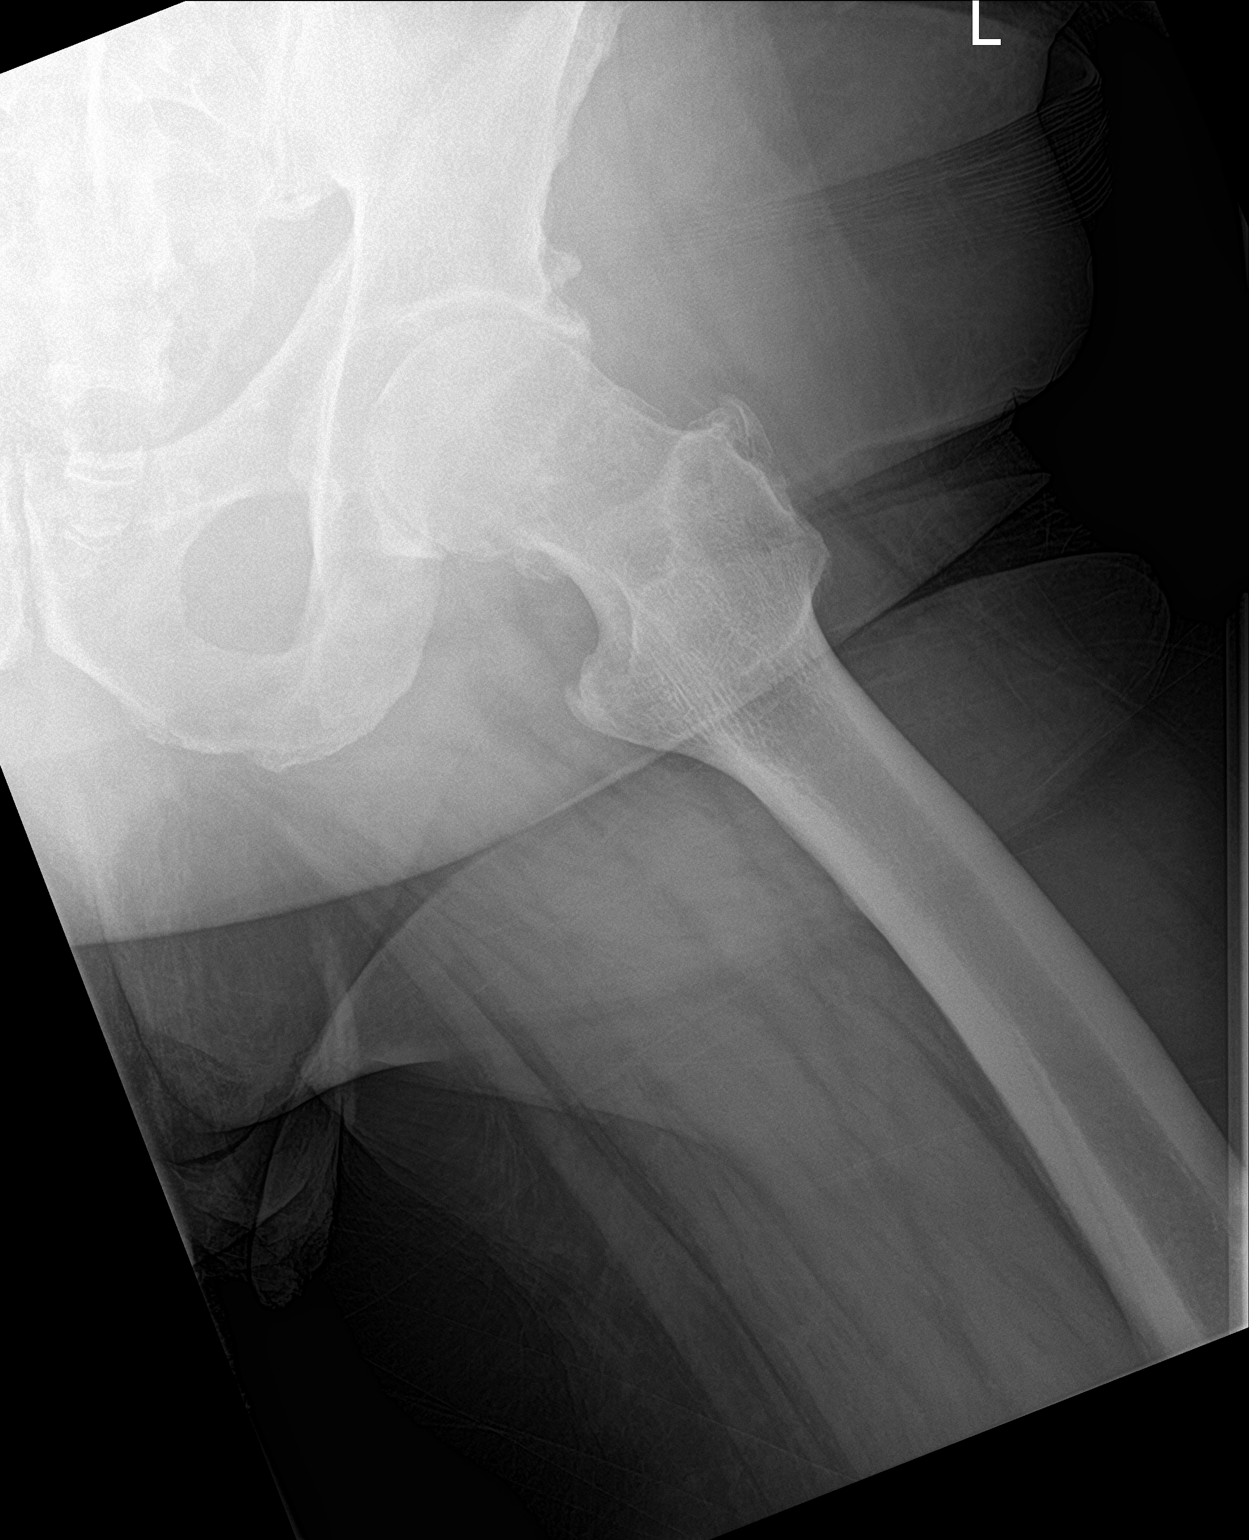

[hip ap]
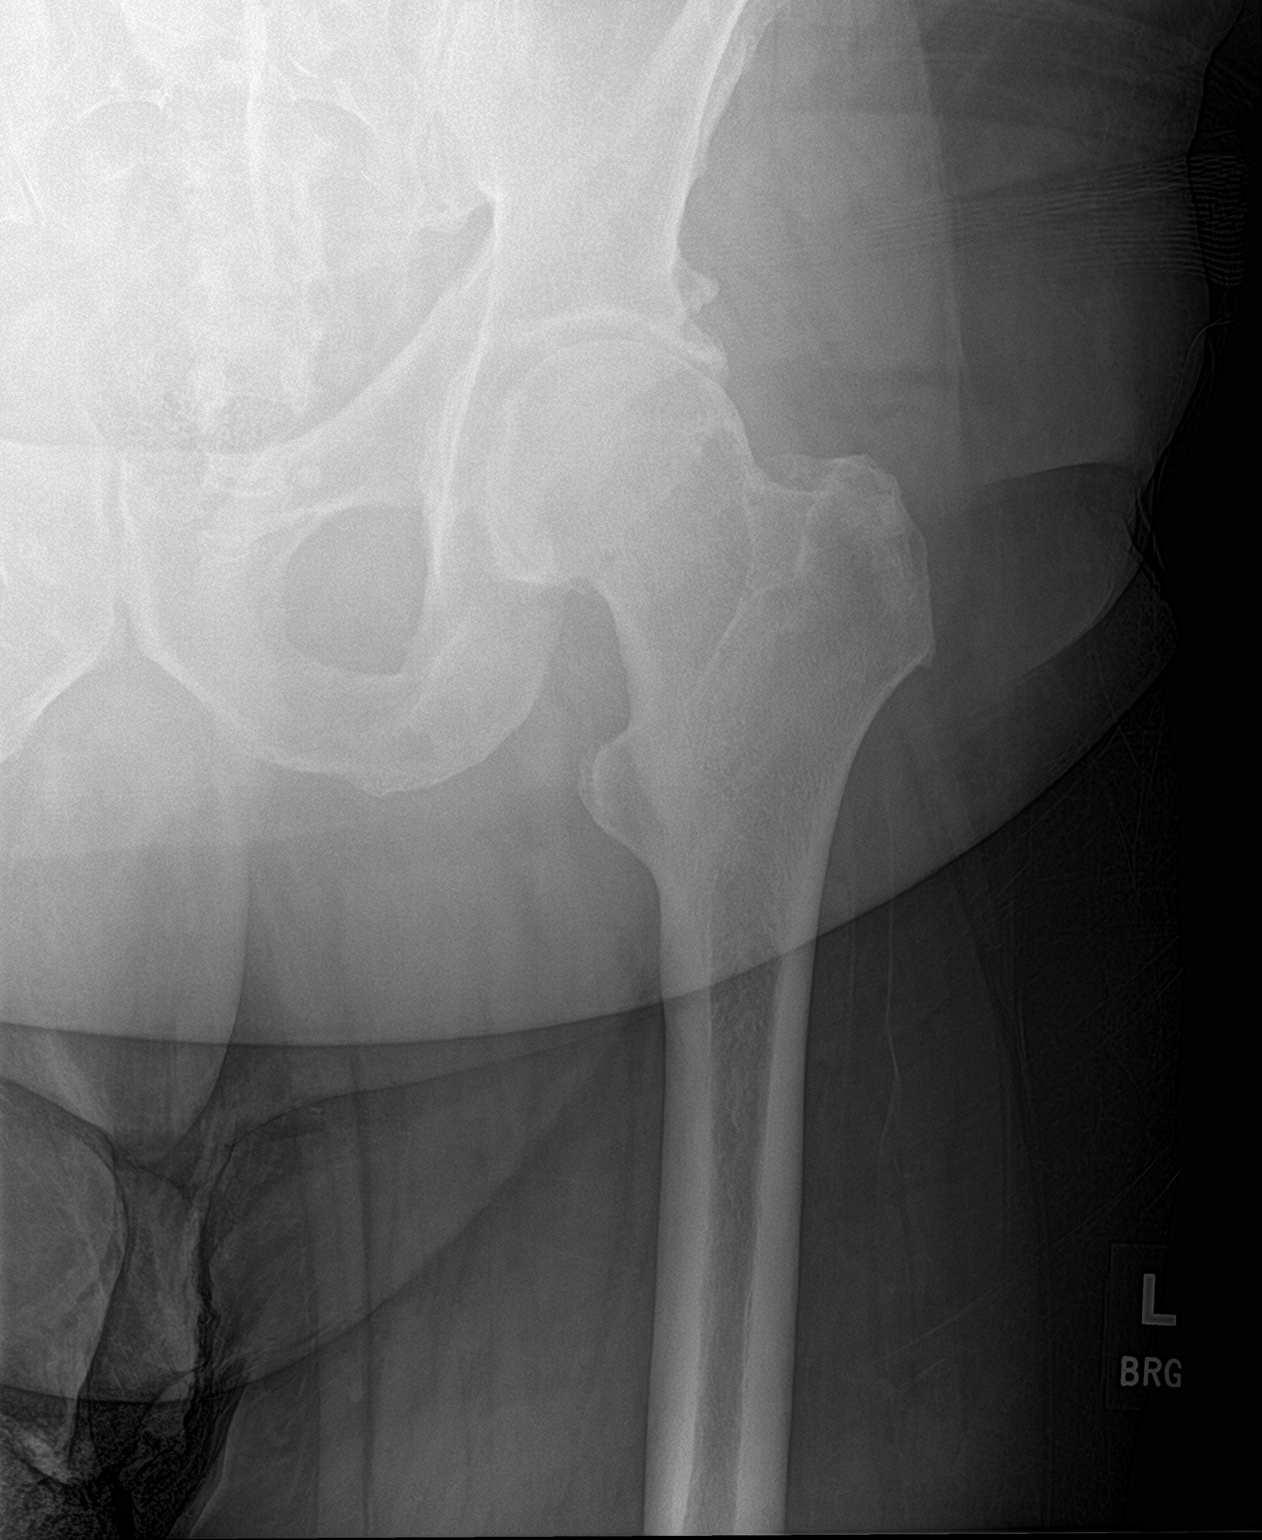

[pelvis ap]
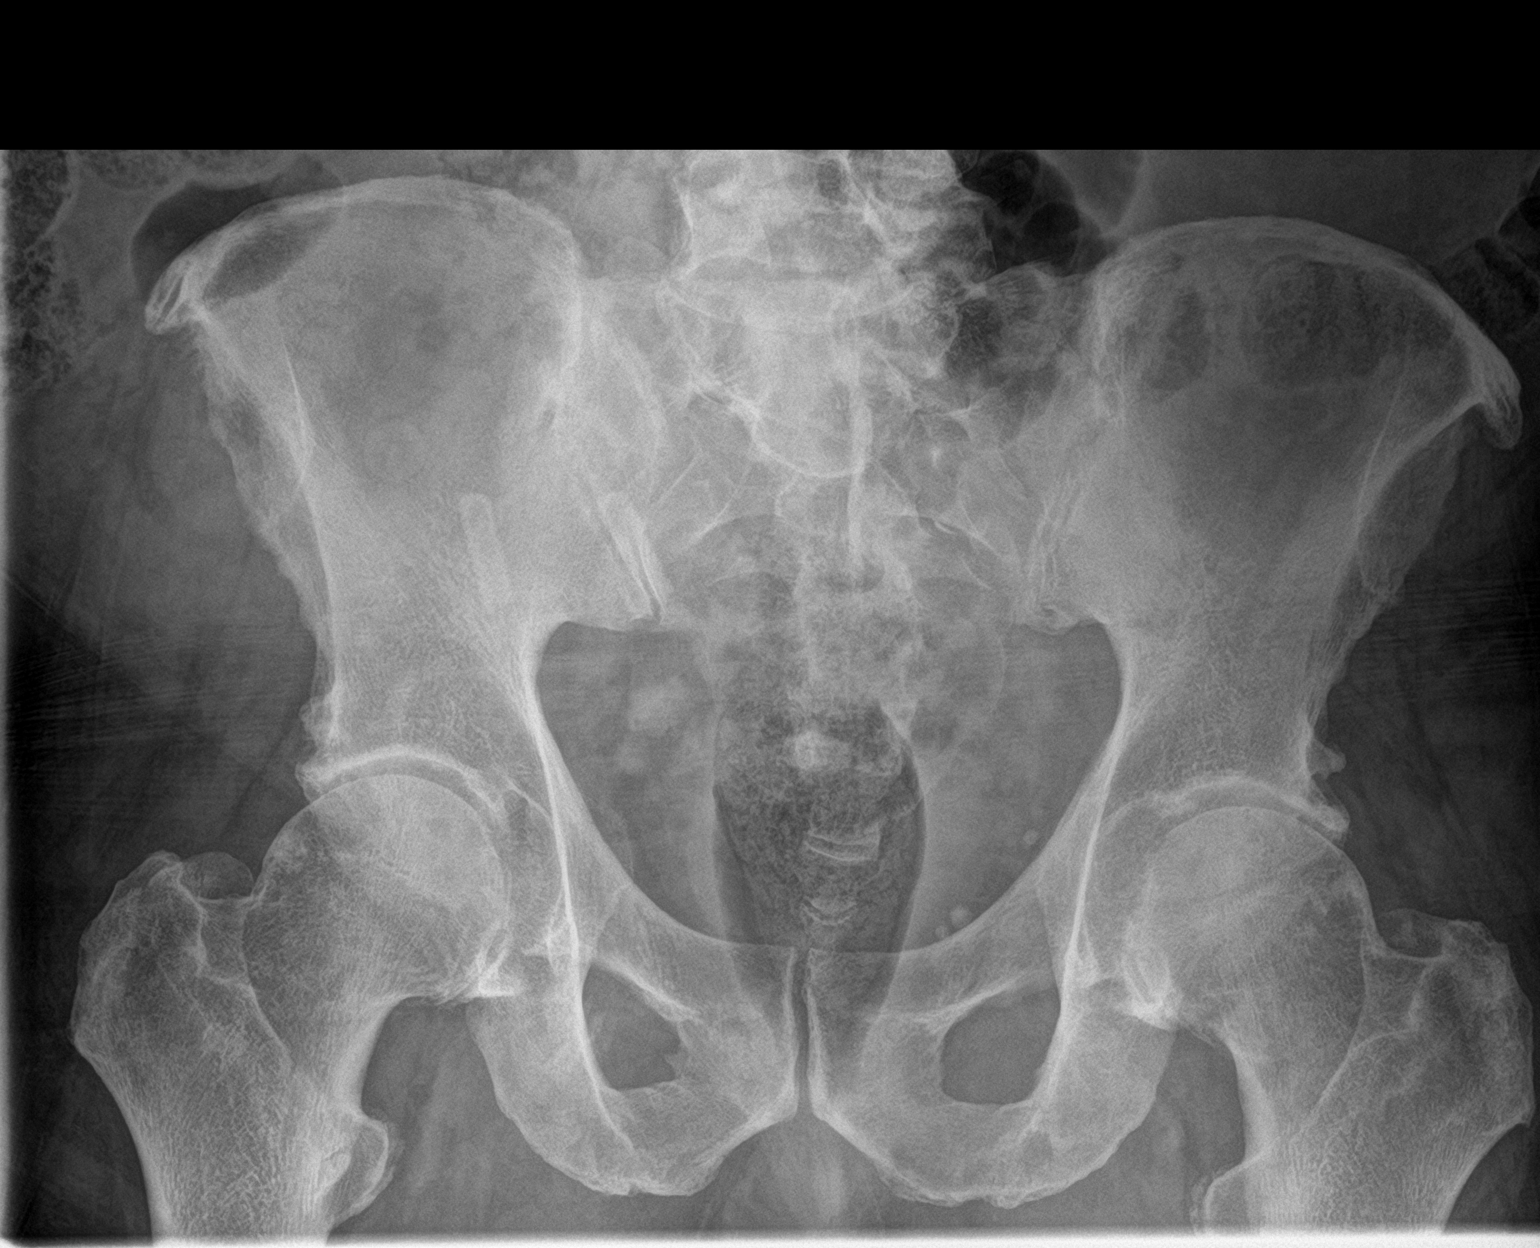

[3 of 3 positions shown; findings below may reference images not displayed]

FINDINGS: There is no evidence of acute hip fracture. There is moderate left
and right hip osteoarthritis. Lower lumbar spine degenerative
changes. Bilateral SI joint degenerative changes.
IMPRESSION: No evidence of left hip fracture.  Moderate left hip osteoarthritis.

## 2023-01-20 IMAGING — US US EXTREM LOW VENOUS
1 series · 13 of 24 positions shown · non-contrast
Comparison: None.

CLINICAL DATA: Bilateral lower extremity pain and edema. Evaluate
for DVT.



[Series 1: us venous img lower bilat (dvt) · portal-venous · 13 of 72 slices shown]
[im 1/72]
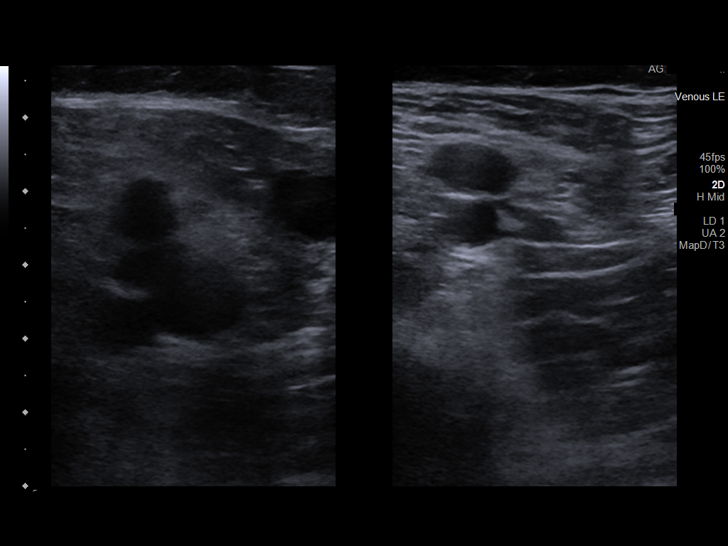
[im 7/72]
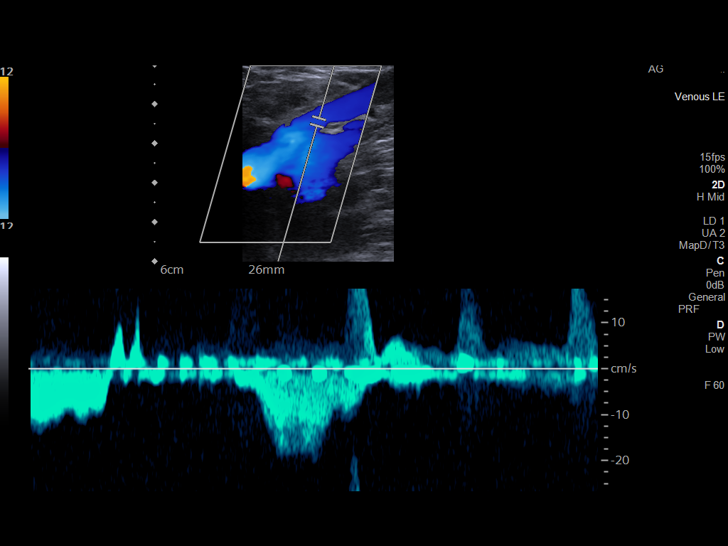
[im 13/72]
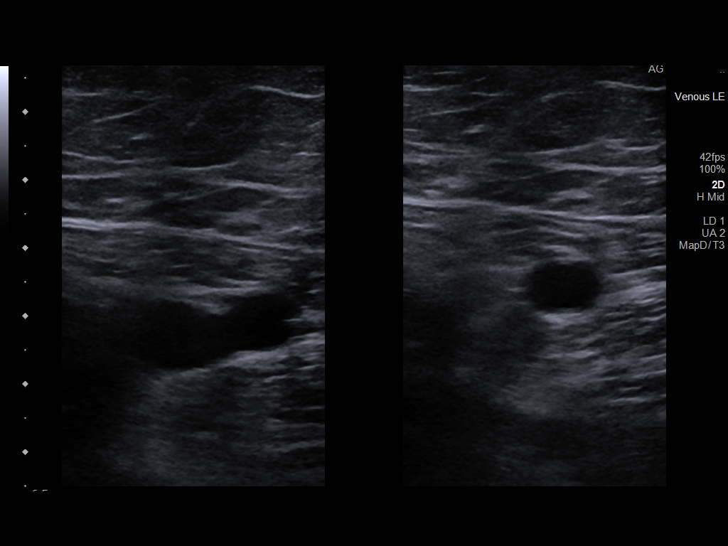
[im 19/72]
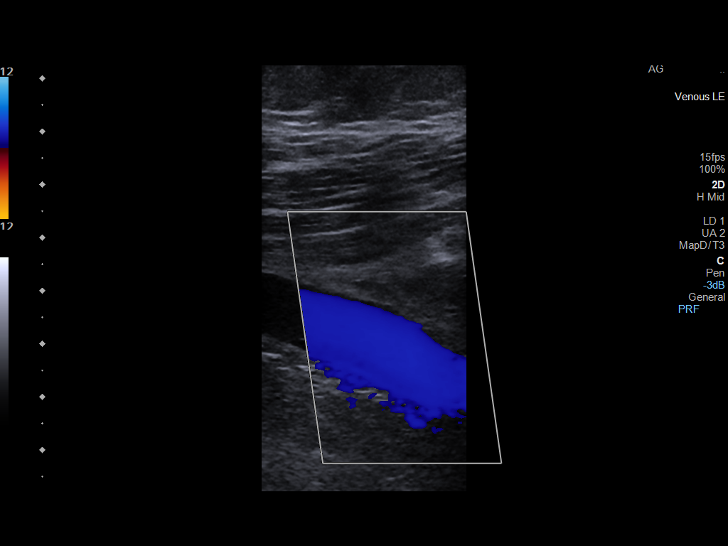
[im 25/72]
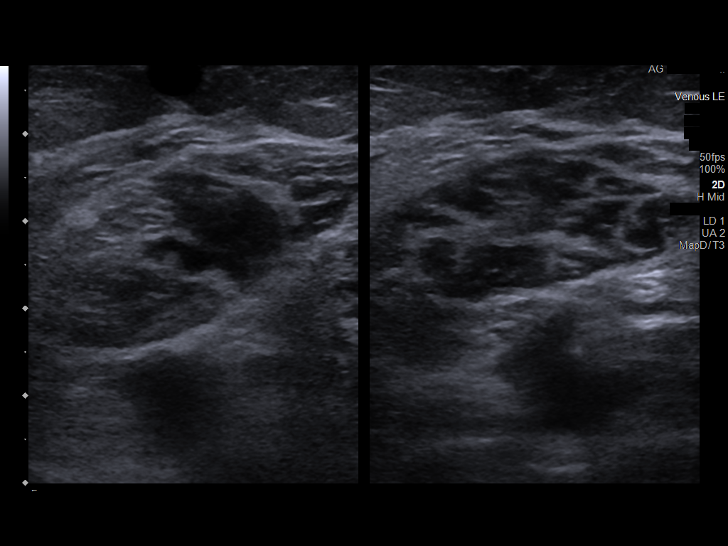
[im 31/72]
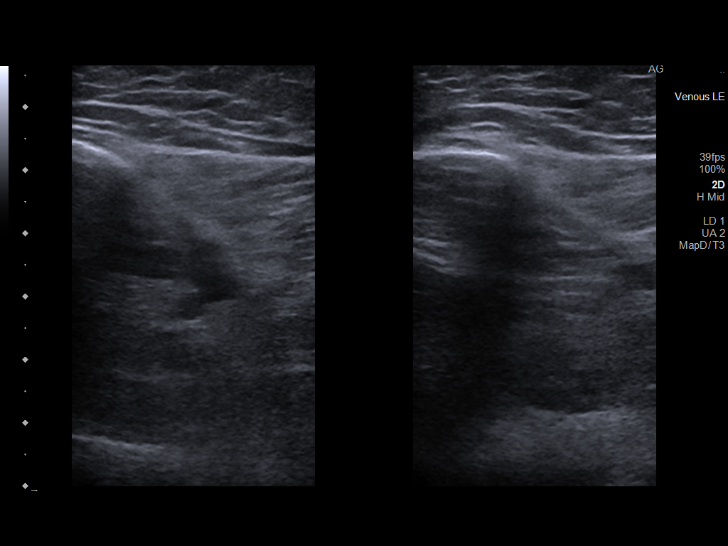
[im 38/72]
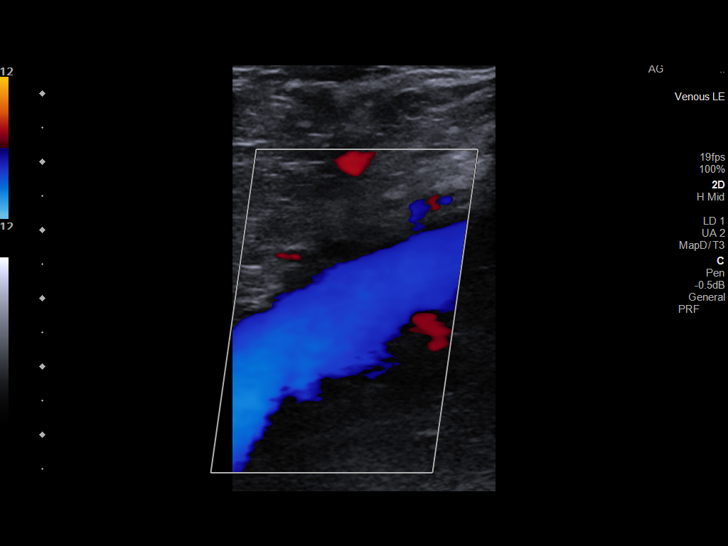
[im 41/72]
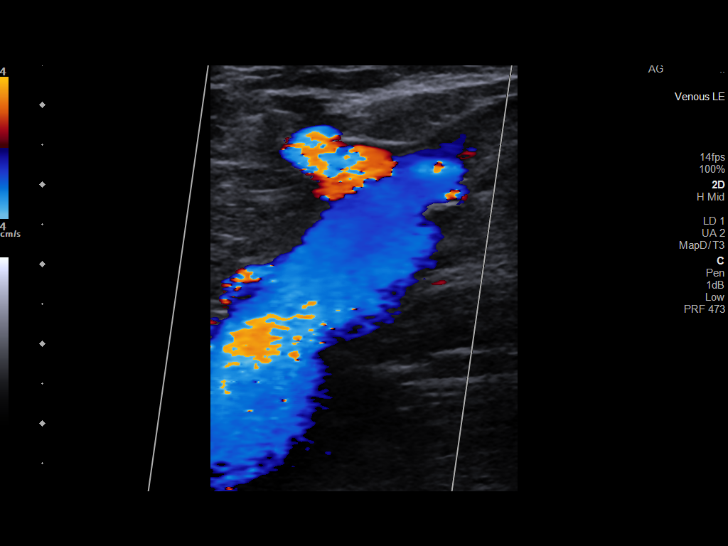
[im 47/72]
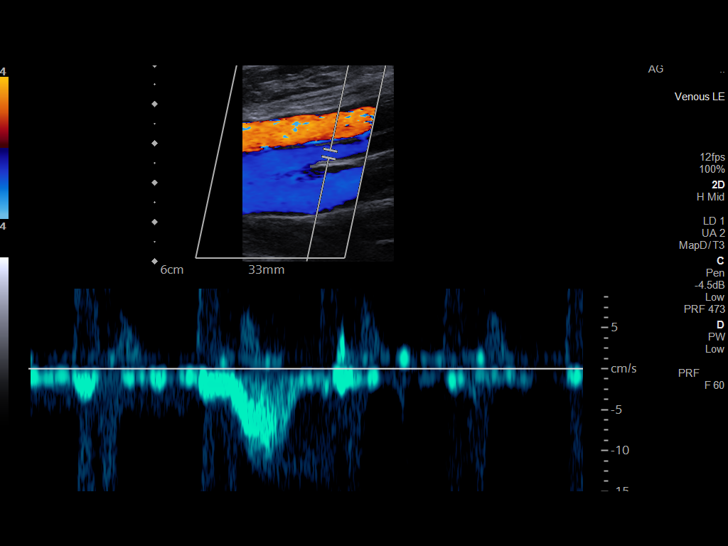
[im 53/72]
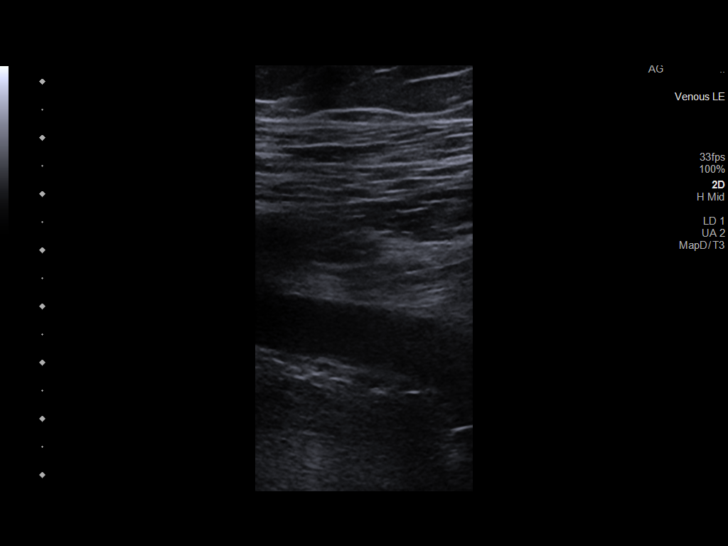
[im 59/72]
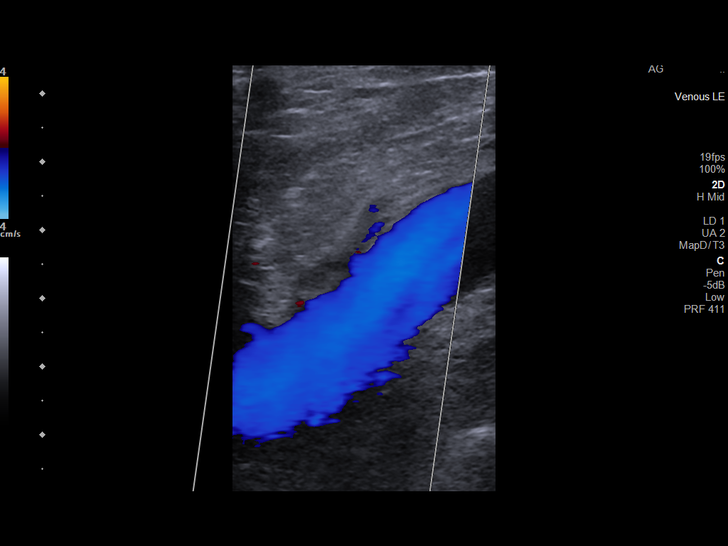
[im 65/72]
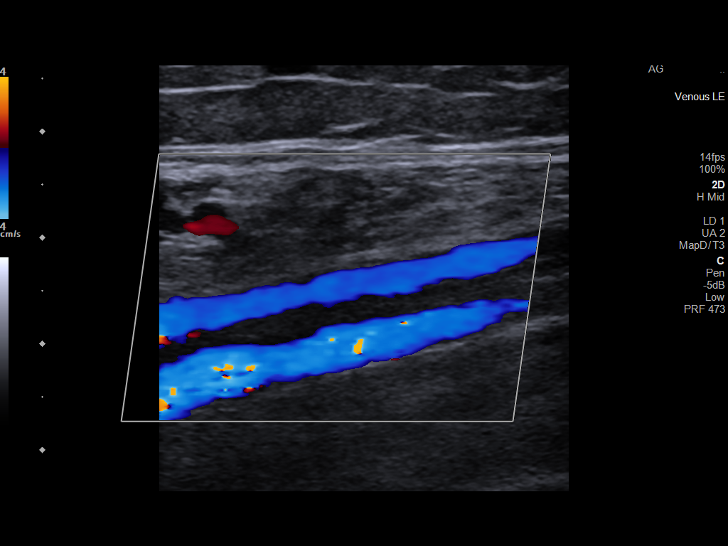
[im 72/72]
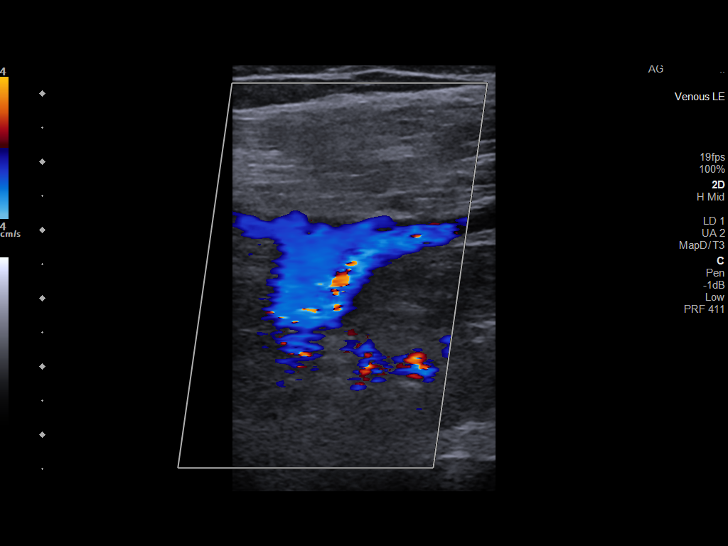

[13 of 24 positions shown; findings below may reference images not displayed]

FINDINGS: RIGHT LOWER EXTREMITY

Common Femoral Vein: No evidence of thrombus. Normal
compressibility, respiratory phasicity and response to augmentation.

Saphenofemoral Junction: No evidence of thrombus. Normal
compressibility and flow on color Doppler imaging.

Profunda Femoral Vein: No evidence of thrombus. Normal
compressibility and flow on color Doppler imaging.

Femoral Vein: No evidence of thrombus. Normal compressibility,
respiratory phasicity and response to augmentation.

Popliteal Vein: No evidence of thrombus. Normal compressibility,
respiratory phasicity and response to augmentation.

Calf Veins: No evidence of thrombus. Normal compressibility and flow
on color Doppler imaging.

Superficial Great Saphenous Vein: No evidence of thrombus. Normal
compressibility.

Venous Reflux:  None.

Other Findings:  None.

LEFT LOWER EXTREMITY

Common Femoral Vein: No evidence of thrombus. Normal
compressibility, respiratory phasicity and response to augmentation.

Saphenofemoral Junction: No evidence of thrombus. Normal
compressibility and flow on color Doppler imaging.

Profunda Femoral Vein: No evidence of thrombus. Normal
compressibility and flow on color Doppler imaging.

Femoral Vein: No evidence of thrombus. Normal compressibility,
respiratory phasicity and response to augmentation.

Popliteal Vein: No evidence of thrombus. Normal compressibility,
respiratory phasicity and response to augmentation.

Calf Veins: No evidence of thrombus. Normal compressibility and flow
on color Doppler imaging.

Superficial Great Saphenous Vein: No evidence of thrombus. Normal
compressibility.

Venous Reflux:  None.

Other Findings:  None.
IMPRESSION: No evidence of DVT within either lower extremity.

## 2023-02-02 ENCOUNTER — Telehealth: Payer: BC Managed Care – PPO | Admitting: Family Medicine

## 2023-02-02 ENCOUNTER — Ambulatory Visit: Payer: BC Managed Care – PPO | Admitting: Pharmacist
